# Patient Record
Sex: Female | Born: 1935 | Race: White | Hispanic: No | Smoking: Former smoker
Health system: Southern US, Community
[De-identification: ages and names within clinical notes are randomized; demographics above are authoritative.]

## PROBLEM LIST (undated history)

## (undated) DIAGNOSIS — IMO0002 Reserved for concepts with insufficient information to code with codable children: Secondary | ICD-10-CM

## (undated) DIAGNOSIS — I771 Stricture of artery: Secondary | ICD-10-CM

## (undated) DIAGNOSIS — J449 Chronic obstructive pulmonary disease, unspecified: Secondary | ICD-10-CM

## (undated) DIAGNOSIS — F419 Anxiety disorder, unspecified: Secondary | ICD-10-CM

## (undated) DIAGNOSIS — K2970 Gastritis, unspecified, without bleeding: Secondary | ICD-10-CM

## (undated) DIAGNOSIS — F329 Major depressive disorder, single episode, unspecified: Secondary | ICD-10-CM

## (undated) DIAGNOSIS — I739 Peripheral vascular disease, unspecified: Secondary | ICD-10-CM

## (undated) DIAGNOSIS — Z720 Tobacco use: Secondary | ICD-10-CM

## (undated) DIAGNOSIS — I779 Disorder of arteries and arterioles, unspecified: Secondary | ICD-10-CM

## (undated) DIAGNOSIS — G4733 Obstructive sleep apnea (adult) (pediatric): Secondary | ICD-10-CM

## (undated) DIAGNOSIS — I10 Essential (primary) hypertension: Secondary | ICD-10-CM

## (undated) DIAGNOSIS — I2510 Atherosclerotic heart disease of native coronary artery without angina pectoris: Secondary | ICD-10-CM

## (undated) DIAGNOSIS — R0902 Hypoxemia: Secondary | ICD-10-CM

## (undated) DIAGNOSIS — E119 Type 2 diabetes mellitus without complications: Secondary | ICD-10-CM

## (undated) DIAGNOSIS — E785 Hyperlipidemia, unspecified: Secondary | ICD-10-CM

## (undated) DIAGNOSIS — I214 Non-ST elevation (NSTEMI) myocardial infarction: Secondary | ICD-10-CM

## (undated) DIAGNOSIS — F32A Depression, unspecified: Secondary | ICD-10-CM

## (undated) HISTORY — DX: Peripheral vascular disease, unspecified: I73.9

## (undated) HISTORY — DX: Atherosclerotic heart disease of native coronary artery without angina pectoris: I25.10

## (undated) HISTORY — DX: Hypoxemia: R09.02

## (undated) HISTORY — DX: Chronic obstructive pulmonary disease, unspecified: J44.9

## (undated) HISTORY — DX: Depression, unspecified: F32.A

## (undated) HISTORY — DX: Reserved for concepts with insufficient information to code with codable children: IMO0002

## (undated) HISTORY — DX: Disorder of arteries and arterioles, unspecified: I77.9

## (undated) HISTORY — PX: THORACOTOMY: SHX5074

## (undated) HISTORY — DX: Hyperlipidemia, unspecified: E78.5

## (undated) HISTORY — DX: Obstructive sleep apnea (adult) (pediatric): G47.33

## (undated) HISTORY — DX: Essential (primary) hypertension: I10

## (undated) HISTORY — DX: Major depressive disorder, single episode, unspecified: F32.9

## (undated) HISTORY — PX: CAROTID ENDARTERECTOMY: SUR193

## (undated) HISTORY — DX: Type 2 diabetes mellitus without complications: E11.9

## (undated) HISTORY — PX: OTHER SURGICAL HISTORY: SHX169

## (undated) HISTORY — PX: TOTAL ABDOMINAL HYSTERECTOMY: SHX209

## (undated) HISTORY — DX: Anxiety disorder, unspecified: F41.9

## (undated) HISTORY — DX: Non-ST elevation (NSTEMI) myocardial infarction: I21.4

## (undated) HISTORY — DX: Tobacco use: Z72.0

---

## 1993-10-07 HISTORY — PX: CORONARY ANGIOPLASTY: SHX604

## 1995-04-07 HISTORY — PX: CORONARY ANGIOPLASTY WITH STENT PLACEMENT: SHX49

## 1995-06-07 HISTORY — PX: OTHER SURGICAL HISTORY: SHX169

## 1997-01-04 HISTORY — PX: CORONARY ANGIOPLASTY WITH STENT PLACEMENT: SHX49

## 1998-07-07 HISTORY — PX: CORONARY ANGIOPLASTY WITH STENT PLACEMENT: SHX49

## 1998-07-17 ENCOUNTER — Encounter: Payer: Self-pay | Admitting: Emergency Medicine

## 1998-07-17 ENCOUNTER — Emergency Department (HOSPITAL_COMMUNITY): Admission: EM | Admit: 1998-07-17 | Discharge: 1998-07-17 | Payer: Self-pay | Admitting: Emergency Medicine

## 1998-07-21 ENCOUNTER — Encounter: Payer: Self-pay | Admitting: Cardiovascular Disease

## 1998-07-21 ENCOUNTER — Observation Stay (HOSPITAL_COMMUNITY): Admission: AD | Admit: 1998-07-21 | Discharge: 1998-07-22 | Payer: Self-pay | Admitting: Cardiovascular Disease

## 1998-11-04 ENCOUNTER — Observation Stay (HOSPITAL_COMMUNITY): Admission: RE | Admit: 1998-11-04 | Discharge: 1998-11-05 | Payer: Self-pay | Admitting: Cardiovascular Disease

## 1998-12-04 ENCOUNTER — Inpatient Hospital Stay (HOSPITAL_COMMUNITY): Admission: RE | Admit: 1998-12-04 | Discharge: 1998-12-05 | Payer: Self-pay | Admitting: Vascular Surgery

## 1998-12-04 ENCOUNTER — Encounter: Payer: Self-pay | Admitting: Vascular Surgery

## 2000-12-25 ENCOUNTER — Emergency Department (HOSPITAL_COMMUNITY): Admission: EM | Admit: 2000-12-25 | Discharge: 2000-12-25 | Payer: Self-pay | Admitting: *Deleted

## 2000-12-27 ENCOUNTER — Emergency Department (HOSPITAL_COMMUNITY): Admission: EM | Admit: 2000-12-27 | Discharge: 2000-12-27 | Payer: Self-pay | Admitting: Emergency Medicine

## 2002-07-23 ENCOUNTER — Ambulatory Visit (HOSPITAL_COMMUNITY): Admission: RE | Admit: 2002-07-23 | Discharge: 2002-07-23 | Payer: Self-pay | Admitting: Cardiovascular Disease

## 2002-07-23 ENCOUNTER — Encounter: Payer: Self-pay | Admitting: Cardiovascular Disease

## 2002-10-04 ENCOUNTER — Other Ambulatory Visit: Admission: RE | Admit: 2002-10-04 | Discharge: 2002-10-04 | Payer: Self-pay | Admitting: *Deleted

## 2002-11-25 ENCOUNTER — Encounter: Payer: Self-pay | Admitting: *Deleted

## 2002-11-25 ENCOUNTER — Emergency Department (HOSPITAL_COMMUNITY): Admission: EM | Admit: 2002-11-25 | Discharge: 2002-11-25 | Payer: Self-pay | Admitting: *Deleted

## 2002-12-03 ENCOUNTER — Encounter (INDEPENDENT_AMBULATORY_CARE_PROVIDER_SITE_OTHER): Payer: Self-pay | Admitting: *Deleted

## 2002-12-03 ENCOUNTER — Inpatient Hospital Stay (HOSPITAL_COMMUNITY): Admission: RE | Admit: 2002-12-03 | Discharge: 2002-12-08 | Payer: Self-pay | Admitting: *Deleted

## 2003-01-15 ENCOUNTER — Encounter: Admission: RE | Admit: 2003-01-15 | Discharge: 2003-04-15 | Payer: Self-pay | Admitting: *Deleted

## 2003-04-30 ENCOUNTER — Encounter: Admission: RE | Admit: 2003-04-30 | Discharge: 2003-07-29 | Payer: Self-pay | Admitting: *Deleted

## 2004-03-15 ENCOUNTER — Emergency Department (HOSPITAL_COMMUNITY): Admission: EM | Admit: 2004-03-15 | Discharge: 2004-03-16 | Payer: Self-pay | Admitting: Emergency Medicine

## 2004-09-09 ENCOUNTER — Encounter: Admission: RE | Admit: 2004-09-09 | Discharge: 2004-09-09 | Payer: Self-pay | Admitting: Cardiovascular Disease

## 2004-09-14 ENCOUNTER — Ambulatory Visit (HOSPITAL_COMMUNITY): Admission: RE | Admit: 2004-09-14 | Discharge: 2004-09-15 | Payer: Self-pay | Admitting: Cardiovascular Disease

## 2004-12-23 ENCOUNTER — Ambulatory Visit: Payer: Self-pay | Admitting: Internal Medicine

## 2004-12-23 ENCOUNTER — Ambulatory Visit (HOSPITAL_COMMUNITY): Admission: RE | Admit: 2004-12-23 | Discharge: 2004-12-23 | Payer: Self-pay | Admitting: Internal Medicine

## 2005-01-04 ENCOUNTER — Ambulatory Visit: Payer: Self-pay | Admitting: Internal Medicine

## 2005-03-30 ENCOUNTER — Ambulatory Visit: Payer: Self-pay | Admitting: Internal Medicine

## 2005-10-13 ENCOUNTER — Ambulatory Visit: Payer: Self-pay | Admitting: Internal Medicine

## 2006-06-20 ENCOUNTER — Ambulatory Visit: Payer: Self-pay | Admitting: Internal Medicine

## 2006-07-19 ENCOUNTER — Ambulatory Visit: Payer: Self-pay | Admitting: Internal Medicine

## 2006-08-24 ENCOUNTER — Ambulatory Visit: Payer: Self-pay | Admitting: Internal Medicine

## 2006-09-23 ENCOUNTER — Ambulatory Visit: Payer: Self-pay | Admitting: Internal Medicine

## 2006-10-11 ENCOUNTER — Ambulatory Visit: Payer: Self-pay | Admitting: Internal Medicine

## 2007-02-28 ENCOUNTER — Ambulatory Visit: Payer: Self-pay | Admitting: Internal Medicine

## 2007-02-28 LAB — CONVERTED CEMR LAB
ALT: 13 units/L (ref 0–40)
BUN: 18 mg/dL (ref 6–23)
Calcium: 9.6 mg/dL (ref 8.4–10.5)
Creatinine, Ser: 0.8 mg/dL (ref 0.4–1.2)
GFR calc Af Amer: 90 mL/min
GFR calc non Af Amer: 75 mL/min
HDL: 48.1 mg/dL (ref >39.0)
LDL Cholesterol: 91 mg/dL (ref 0–99)
VLDL: 24 mg/dL (ref 0–40)

## 2007-05-18 ENCOUNTER — Ambulatory Visit: Payer: Self-pay | Admitting: Internal Medicine

## 2007-05-18 LAB — CONVERTED CEMR LAB
Crystals: NEGATIVE
RBC / HPF: NONE SEEN
Total Protein, Urine: NEGATIVE mg/dL
Urine Glucose: NEGATIVE mg/dL
pH: 5.5 (ref 5.0–8.0)

## 2007-05-19 ENCOUNTER — Encounter: Payer: Self-pay | Admitting: Internal Medicine

## 2007-07-04 ENCOUNTER — Encounter: Payer: Self-pay | Admitting: Internal Medicine

## 2007-07-04 ENCOUNTER — Ambulatory Visit: Payer: Self-pay | Admitting: Internal Medicine

## 2007-07-04 DIAGNOSIS — I739 Peripheral vascular disease, unspecified: Secondary | ICD-10-CM | POA: Insufficient documentation

## 2007-07-04 DIAGNOSIS — E1149 Type 2 diabetes mellitus with other diabetic neurological complication: Secondary | ICD-10-CM | POA: Insufficient documentation

## 2007-07-04 DIAGNOSIS — E785 Hyperlipidemia, unspecified: Secondary | ICD-10-CM

## 2007-07-04 DIAGNOSIS — F17200 Nicotine dependence, unspecified, uncomplicated: Secondary | ICD-10-CM | POA: Insufficient documentation

## 2007-07-04 DIAGNOSIS — E119 Type 2 diabetes mellitus without complications: Secondary | ICD-10-CM | POA: Insufficient documentation

## 2007-07-04 DIAGNOSIS — K5900 Constipation, unspecified: Secondary | ICD-10-CM | POA: Insufficient documentation

## 2007-07-04 DIAGNOSIS — M259 Joint disorder, unspecified: Secondary | ICD-10-CM | POA: Insufficient documentation

## 2007-07-04 DIAGNOSIS — I2510 Atherosclerotic heart disease of native coronary artery without angina pectoris: Secondary | ICD-10-CM | POA: Insufficient documentation

## 2007-07-04 DIAGNOSIS — I7389 Other specified peripheral vascular diseases: Secondary | ICD-10-CM

## 2007-07-04 DIAGNOSIS — M25519 Pain in unspecified shoulder: Secondary | ICD-10-CM | POA: Insufficient documentation

## 2007-07-26 ENCOUNTER — Inpatient Hospital Stay (HOSPITAL_COMMUNITY): Admission: EM | Admit: 2007-07-26 | Discharge: 2007-07-29 | Payer: Self-pay | Admitting: Emergency Medicine

## 2007-07-31 ENCOUNTER — Telehealth: Payer: Self-pay | Admitting: Internal Medicine

## 2007-08-01 ENCOUNTER — Ambulatory Visit: Payer: Self-pay | Admitting: Internal Medicine

## 2007-08-02 ENCOUNTER — Encounter: Payer: Self-pay | Admitting: Internal Medicine

## 2007-08-07 HISTORY — PX: CORONARY ANGIOGRAM: SHX5786

## 2007-08-17 ENCOUNTER — Observation Stay (HOSPITAL_COMMUNITY): Admission: RE | Admit: 2007-08-17 | Discharge: 2007-08-18 | Payer: Self-pay | Admitting: Cardiovascular Disease

## 2007-09-13 ENCOUNTER — Encounter: Payer: Self-pay | Admitting: Internal Medicine

## 2007-09-19 ENCOUNTER — Ambulatory Visit: Payer: Self-pay | Admitting: Internal Medicine

## 2007-09-19 LAB — CONVERTED CEMR LAB
ALT: 12 units/L (ref 0–35)
BUN: 12 mg/dL (ref 6–23)
CO2: 32 meq/L (ref 19–32)
Creatinine, Ser: 0.8 mg/dL (ref 0.4–1.2)
GFR calc non Af Amer: 75 mL/min
Glucose, Bld: 112 mg/dL — ABNORMAL HIGH (ref 70–99)
HDL: 45.8 mg/dL (ref >39.0)
LDL Cholesterol: 70 mg/dL (ref 0–99)
Potassium: 4.7 meq/L (ref 3.5–5.1)
Total CHOL/HDL Ratio: 3.1
Triglycerides: 126 mg/dL (ref 0–149)

## 2007-09-27 ENCOUNTER — Encounter: Payer: Self-pay | Admitting: Pulmonary Disease

## 2007-10-04 ENCOUNTER — Ambulatory Visit: Payer: Self-pay | Admitting: Internal Medicine

## 2007-10-23 ENCOUNTER — Telehealth: Payer: Self-pay | Admitting: Internal Medicine

## 2007-10-26 ENCOUNTER — Telehealth: Payer: Self-pay | Admitting: Internal Medicine

## 2007-10-26 ENCOUNTER — Ambulatory Visit: Payer: Self-pay | Admitting: Internal Medicine

## 2008-04-06 DIAGNOSIS — IMO0002 Reserved for concepts with insufficient information to code with codable children: Secondary | ICD-10-CM

## 2008-04-06 HISTORY — DX: Reserved for concepts with insufficient information to code with codable children: IMO0002

## 2008-04-22 ENCOUNTER — Encounter: Payer: Self-pay | Admitting: Pulmonary Disease

## 2008-04-24 ENCOUNTER — Encounter: Payer: Self-pay | Admitting: Internal Medicine

## 2008-05-15 ENCOUNTER — Telehealth: Payer: Self-pay | Admitting: Internal Medicine

## 2008-06-04 ENCOUNTER — Ambulatory Visit: Payer: Self-pay | Admitting: Internal Medicine

## 2008-06-10 ENCOUNTER — Encounter: Payer: Self-pay | Admitting: Internal Medicine

## 2008-06-12 ENCOUNTER — Ambulatory Visit: Payer: Self-pay | Admitting: Pulmonary Disease

## 2008-06-12 DIAGNOSIS — I2789 Other specified pulmonary heart diseases: Secondary | ICD-10-CM

## 2008-06-13 ENCOUNTER — Telehealth: Payer: Self-pay | Admitting: Pulmonary Disease

## 2008-06-14 DIAGNOSIS — G4733 Obstructive sleep apnea (adult) (pediatric): Secondary | ICD-10-CM

## 2008-06-18 ENCOUNTER — Telehealth: Payer: Self-pay | Admitting: Pulmonary Disease

## 2008-06-19 ENCOUNTER — Ambulatory Visit: Payer: Self-pay | Admitting: Pulmonary Disease

## 2008-06-19 ENCOUNTER — Encounter: Payer: Self-pay | Admitting: Internal Medicine

## 2008-06-20 ENCOUNTER — Telehealth: Payer: Self-pay | Admitting: Internal Medicine

## 2008-06-28 ENCOUNTER — Telehealth (INDEPENDENT_AMBULATORY_CARE_PROVIDER_SITE_OTHER): Payer: Self-pay | Admitting: *Deleted

## 2008-07-01 ENCOUNTER — Ambulatory Visit: Payer: Self-pay | Admitting: Internal Medicine

## 2008-07-01 ENCOUNTER — Encounter: Payer: Self-pay | Admitting: Pulmonary Disease

## 2008-07-08 ENCOUNTER — Telehealth: Payer: Self-pay | Admitting: Internal Medicine

## 2008-07-15 ENCOUNTER — Telehealth: Payer: Self-pay | Admitting: Internal Medicine

## 2008-07-16 ENCOUNTER — Ambulatory Visit: Payer: Self-pay | Admitting: Pulmonary Disease

## 2008-07-16 DIAGNOSIS — J449 Chronic obstructive pulmonary disease, unspecified: Secondary | ICD-10-CM | POA: Insufficient documentation

## 2008-07-18 ENCOUNTER — Encounter: Payer: Self-pay | Admitting: Pulmonary Disease

## 2008-08-07 ENCOUNTER — Encounter: Payer: Self-pay | Admitting: Pulmonary Disease

## 2008-08-07 ENCOUNTER — Encounter (HOSPITAL_COMMUNITY): Admission: RE | Admit: 2008-08-07 | Discharge: 2008-09-05 | Payer: Self-pay | Admitting: Pulmonary Disease

## 2008-08-13 ENCOUNTER — Telehealth: Payer: Self-pay | Admitting: Internal Medicine

## 2008-09-06 ENCOUNTER — Encounter (HOSPITAL_COMMUNITY): Admission: RE | Admit: 2008-09-06 | Discharge: 2008-12-05 | Payer: Self-pay | Admitting: Pulmonary Disease

## 2008-09-12 ENCOUNTER — Encounter: Payer: Self-pay | Admitting: Internal Medicine

## 2008-09-17 ENCOUNTER — Encounter: Payer: Self-pay | Admitting: Internal Medicine

## 2008-09-18 ENCOUNTER — Encounter: Payer: Self-pay | Admitting: Pulmonary Disease

## 2008-09-20 ENCOUNTER — Telehealth: Payer: Self-pay | Admitting: Internal Medicine

## 2008-10-07 ENCOUNTER — Telehealth: Payer: Self-pay | Admitting: Internal Medicine

## 2008-10-09 ENCOUNTER — Encounter: Payer: Self-pay | Admitting: Pulmonary Disease

## 2008-10-09 ENCOUNTER — Telehealth: Payer: Self-pay | Admitting: Internal Medicine

## 2008-10-22 ENCOUNTER — Ambulatory Visit: Payer: Self-pay | Admitting: Internal Medicine

## 2008-10-22 LAB — CONVERTED CEMR LAB
BUN: 19 mg/dL (ref 6–23)
CO2: 31 meq/L (ref 19–32)
Chloride: 103 meq/L (ref 96–112)
Creatinine,U: 101.9 mg/dL
GFR calc Af Amer: 105 mL/min
GFR calc non Af Amer: 87 mL/min
Glucose, Bld: 143 mg/dL — ABNORMAL HIGH (ref 70–99)
Hgb A1c MFr Bld: 7.1 % — ABNORMAL HIGH (ref 4.6–6.0)
Microalb, Ur: 5.6 mg/dL — ABNORMAL HIGH (ref 0.0–1.9)

## 2008-10-23 ENCOUNTER — Encounter: Payer: Self-pay | Admitting: Internal Medicine

## 2008-11-06 ENCOUNTER — Ambulatory Visit: Payer: Self-pay | Admitting: Pulmonary Disease

## 2008-11-20 ENCOUNTER — Encounter: Payer: Self-pay | Admitting: Internal Medicine

## 2008-12-06 ENCOUNTER — Encounter (HOSPITAL_COMMUNITY): Admission: RE | Admit: 2008-12-06 | Discharge: 2009-03-06 | Payer: Self-pay | Admitting: Pulmonary Disease

## 2008-12-09 ENCOUNTER — Telehealth: Payer: Self-pay | Admitting: Internal Medicine

## 2008-12-18 ENCOUNTER — Telehealth: Payer: Self-pay | Admitting: Internal Medicine

## 2008-12-19 ENCOUNTER — Ambulatory Visit: Payer: Self-pay | Admitting: Internal Medicine

## 2008-12-19 LAB — CONVERTED CEMR LAB
Bilirubin Urine: NEGATIVE
Hemoglobin, Urine: NEGATIVE
Ketones, ur: NEGATIVE mg/dL
Leukocytes, UA: NEGATIVE
Nitrite: NEGATIVE
Urine Glucose: NEGATIVE mg/dL
Urobilinogen, UA: 0.2 (ref 0.0–1.0)

## 2008-12-24 ENCOUNTER — Telehealth: Payer: Self-pay | Admitting: Internal Medicine

## 2008-12-31 ENCOUNTER — Telehealth: Payer: Self-pay | Admitting: Internal Medicine

## 2009-01-13 ENCOUNTER — Telehealth: Payer: Self-pay | Admitting: Internal Medicine

## 2009-01-13 ENCOUNTER — Ambulatory Visit: Payer: Self-pay | Admitting: Internal Medicine

## 2009-01-15 ENCOUNTER — Encounter: Payer: Self-pay | Admitting: Internal Medicine

## 2009-03-19 ENCOUNTER — Encounter: Payer: Self-pay | Admitting: Internal Medicine

## 2009-03-19 ENCOUNTER — Ambulatory Visit: Payer: Self-pay | Admitting: Pulmonary Disease

## 2009-03-19 DIAGNOSIS — J9611 Chronic respiratory failure with hypoxia: Secondary | ICD-10-CM | POA: Insufficient documentation

## 2009-03-21 ENCOUNTER — Encounter: Payer: Self-pay | Admitting: Pulmonary Disease

## 2009-03-24 ENCOUNTER — Encounter: Payer: Self-pay | Admitting: Pulmonary Disease

## 2009-03-25 ENCOUNTER — Encounter: Admission: RE | Admit: 2009-03-25 | Discharge: 2009-03-25 | Payer: Self-pay | Admitting: Cardiovascular Disease

## 2009-03-25 ENCOUNTER — Encounter: Payer: Self-pay | Admitting: Internal Medicine

## 2009-03-26 ENCOUNTER — Encounter: Payer: Self-pay | Admitting: Pulmonary Disease

## 2009-03-31 ENCOUNTER — Inpatient Hospital Stay (HOSPITAL_COMMUNITY): Admission: RE | Admit: 2009-03-31 | Discharge: 2009-04-09 | Payer: Self-pay | Admitting: Cardiovascular Disease

## 2009-04-01 ENCOUNTER — Encounter (INDEPENDENT_AMBULATORY_CARE_PROVIDER_SITE_OTHER): Payer: Self-pay | Admitting: Cardiovascular Disease

## 2009-04-07 ENCOUNTER — Encounter (INDEPENDENT_AMBULATORY_CARE_PROVIDER_SITE_OTHER): Payer: Self-pay | Admitting: Cardiology

## 2009-04-07 ENCOUNTER — Ambulatory Visit: Payer: Self-pay | Admitting: Vascular Surgery

## 2009-04-11 ENCOUNTER — Telehealth: Payer: Self-pay | Admitting: Internal Medicine

## 2009-04-20 ENCOUNTER — Encounter: Payer: Self-pay | Admitting: Pulmonary Disease

## 2009-04-22 ENCOUNTER — Encounter: Payer: Self-pay | Admitting: Internal Medicine

## 2009-05-01 ENCOUNTER — Encounter: Payer: Self-pay | Admitting: Pulmonary Disease

## 2009-05-20 ENCOUNTER — Ambulatory Visit: Payer: Self-pay | Admitting: Pulmonary Disease

## 2009-06-17 ENCOUNTER — Telehealth: Payer: Self-pay | Admitting: Internal Medicine

## 2009-07-03 ENCOUNTER — Ambulatory Visit: Payer: Self-pay | Admitting: Pulmonary Disease

## 2009-07-03 ENCOUNTER — Inpatient Hospital Stay (HOSPITAL_COMMUNITY): Admission: AD | Admit: 2009-07-03 | Discharge: 2009-07-07 | Payer: Self-pay | Admitting: Pulmonary Disease

## 2009-07-09 ENCOUNTER — Encounter: Payer: Self-pay | Admitting: Pulmonary Disease

## 2009-07-14 ENCOUNTER — Ambulatory Visit: Payer: Self-pay | Admitting: Pulmonary Disease

## 2009-07-16 ENCOUNTER — Encounter: Payer: Self-pay | Admitting: Internal Medicine

## 2009-07-21 ENCOUNTER — Ambulatory Visit: Payer: Self-pay | Admitting: Internal Medicine

## 2009-07-22 ENCOUNTER — Encounter: Payer: Self-pay | Admitting: Internal Medicine

## 2009-07-25 ENCOUNTER — Telehealth: Payer: Self-pay | Admitting: Internal Medicine

## 2009-07-28 ENCOUNTER — Telehealth (INDEPENDENT_AMBULATORY_CARE_PROVIDER_SITE_OTHER): Payer: Self-pay | Admitting: *Deleted

## 2009-07-30 ENCOUNTER — Ambulatory Visit: Payer: Self-pay | Admitting: Internal Medicine

## 2009-08-08 ENCOUNTER — Telehealth (INDEPENDENT_AMBULATORY_CARE_PROVIDER_SITE_OTHER): Payer: Self-pay | Admitting: *Deleted

## 2009-08-12 ENCOUNTER — Telehealth (INDEPENDENT_AMBULATORY_CARE_PROVIDER_SITE_OTHER): Payer: Self-pay | Admitting: *Deleted

## 2009-08-13 ENCOUNTER — Ambulatory Visit: Payer: Self-pay | Admitting: Pulmonary Disease

## 2009-09-15 ENCOUNTER — Telehealth: Payer: Self-pay | Admitting: Pulmonary Disease

## 2009-09-30 ENCOUNTER — Encounter: Payer: Self-pay | Admitting: Pulmonary Disease

## 2009-10-15 ENCOUNTER — Ambulatory Visit: Payer: Self-pay | Admitting: Pulmonary Disease

## 2009-10-15 ENCOUNTER — Encounter: Payer: Self-pay | Admitting: Pulmonary Disease

## 2009-10-21 ENCOUNTER — Encounter: Payer: Self-pay | Admitting: Pulmonary Disease

## 2009-12-17 ENCOUNTER — Encounter: Payer: Self-pay | Admitting: Pulmonary Disease

## 2009-12-18 ENCOUNTER — Telehealth: Payer: Self-pay | Admitting: Internal Medicine

## 2009-12-21 ENCOUNTER — Encounter: Payer: Self-pay | Admitting: Pulmonary Disease

## 2009-12-24 ENCOUNTER — Ambulatory Visit: Payer: Self-pay | Admitting: Internal Medicine

## 2009-12-26 DIAGNOSIS — R1013 Epigastric pain: Secondary | ICD-10-CM

## 2009-12-26 DIAGNOSIS — K3189 Other diseases of stomach and duodenum: Secondary | ICD-10-CM

## 2009-12-30 ENCOUNTER — Encounter: Payer: Self-pay | Admitting: Pulmonary Disease

## 2010-01-05 ENCOUNTER — Encounter: Payer: Self-pay | Admitting: Pulmonary Disease

## 2010-01-15 IMAGING — CR DG CHEST 2V
2 series · 2 of 2 positions shown · non-contrast
Comparison: 10/26/2007

CLINICAL DATA: Pre procedure, shortness of breath.

CHEST - 2 VIEW

[w chest pa]
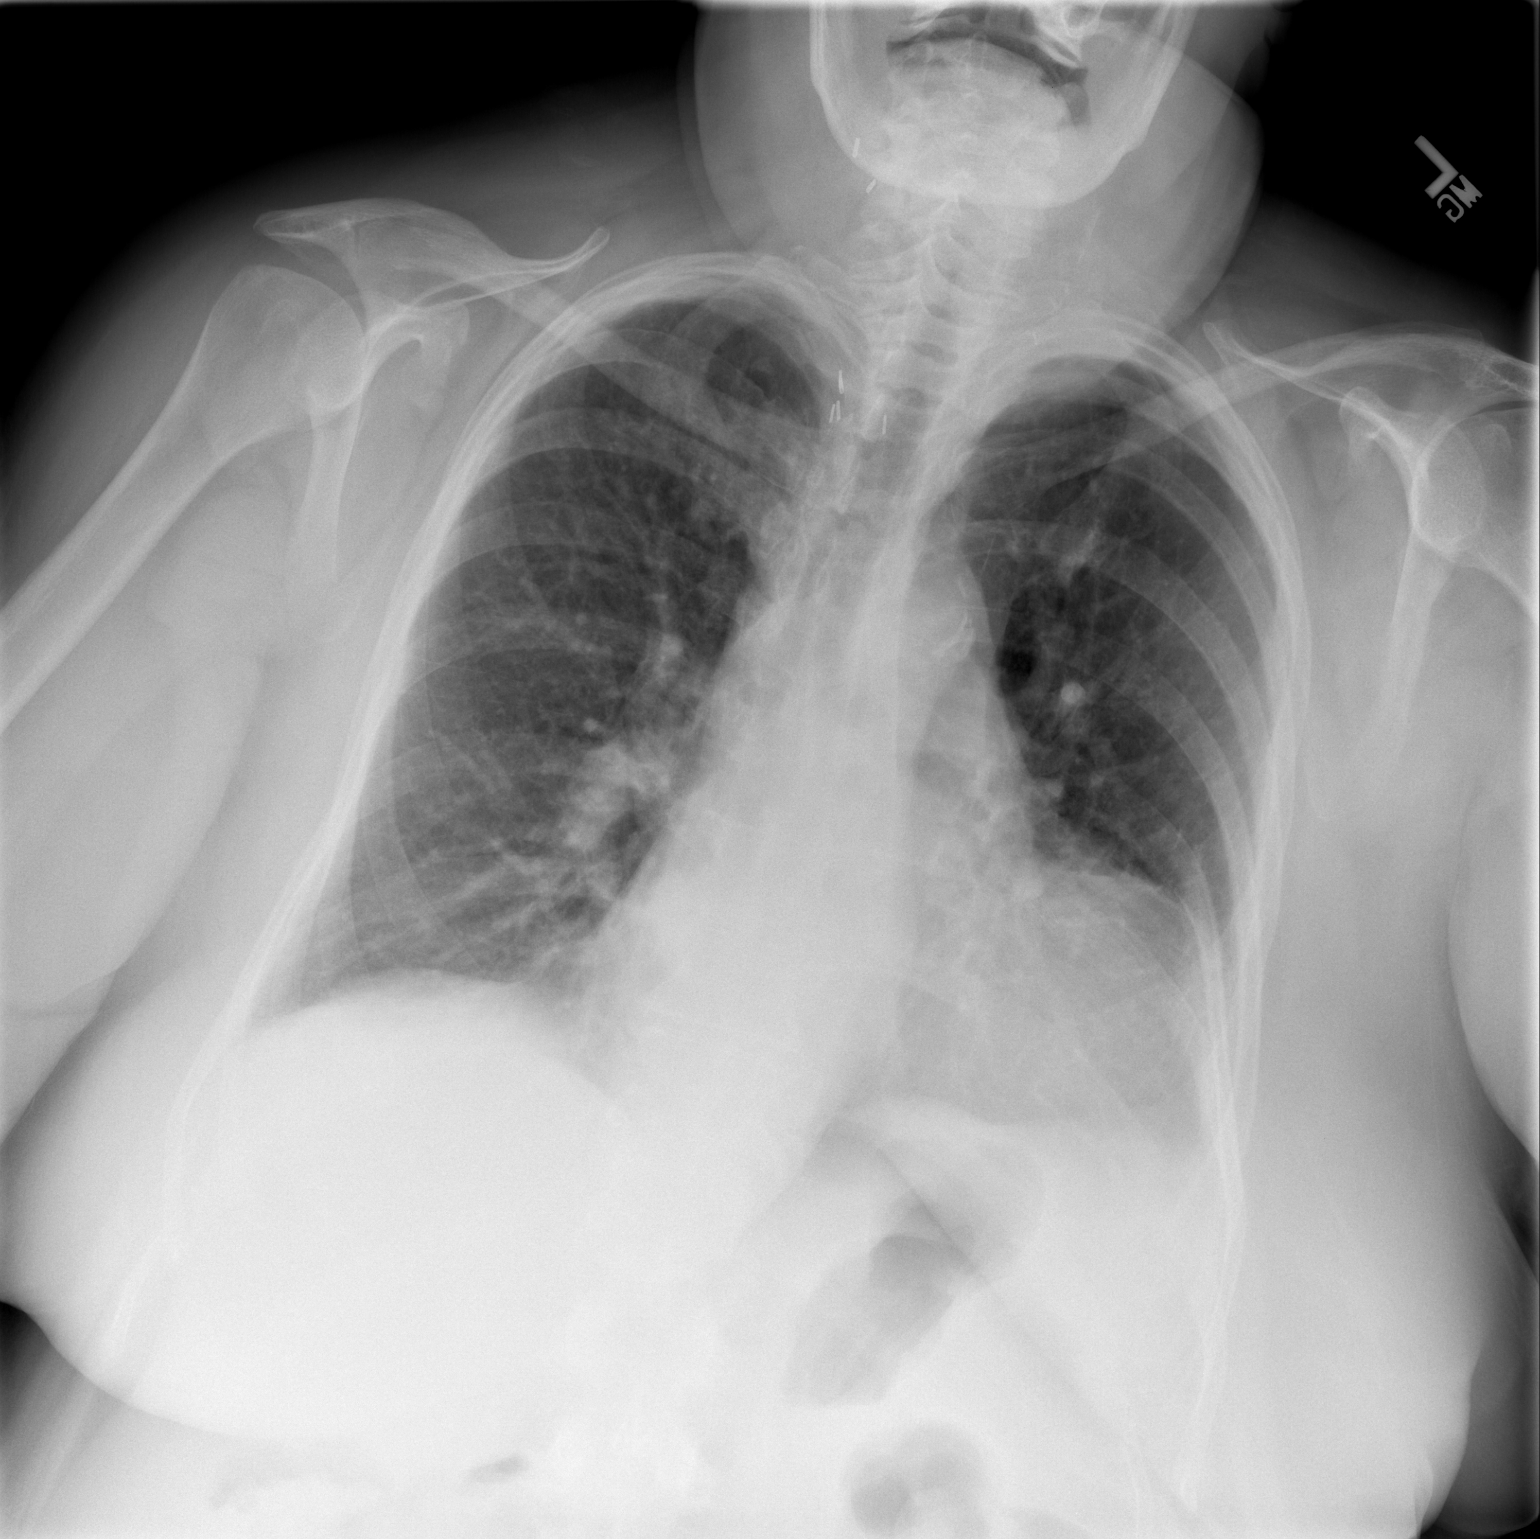

[w chest lat]
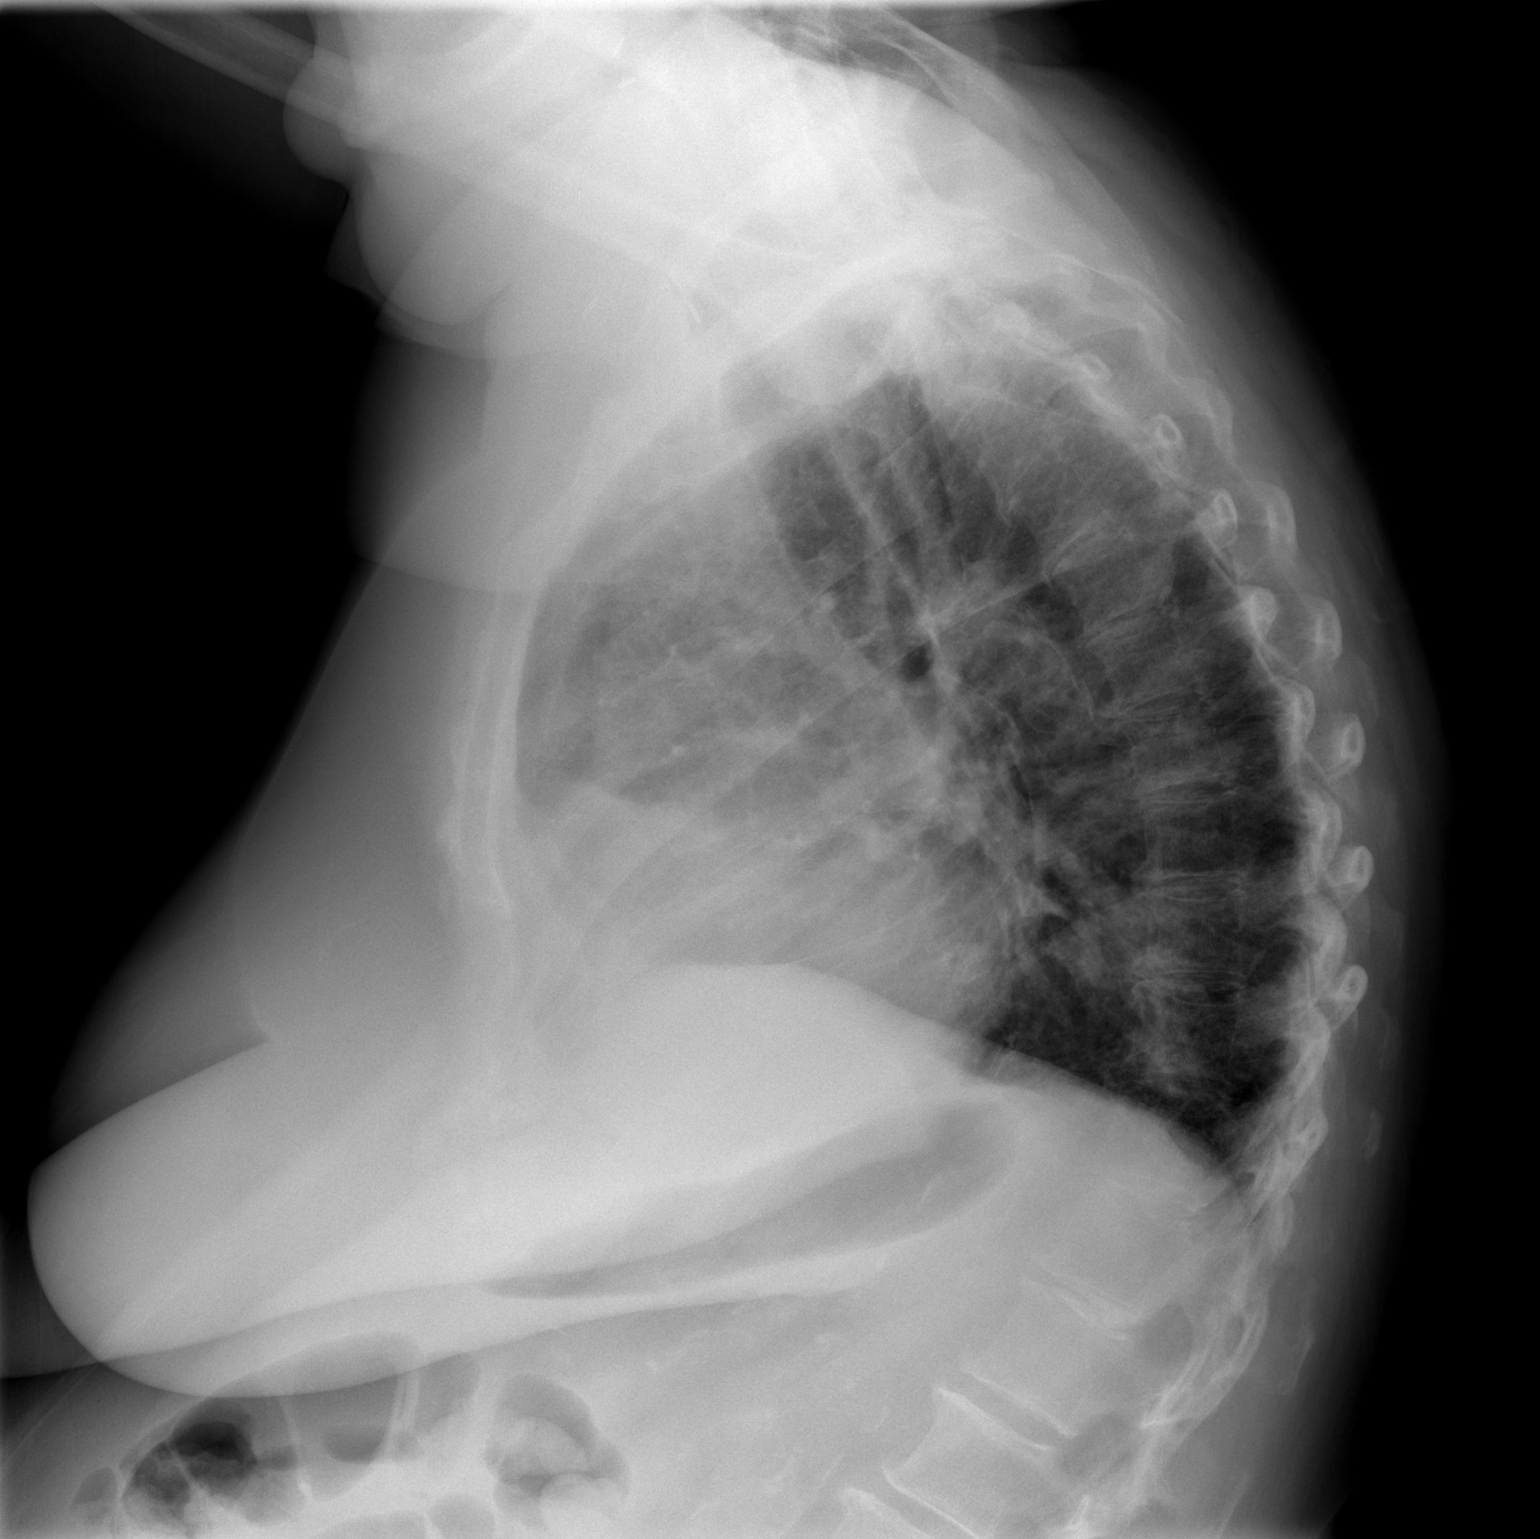

[2 of 2 positions shown; findings below may reference images not displayed]

FINDINGS: Trachea is midline.  Surgical clips are seen in the
paratracheal region.  Heart is enlarged, stable.  Thoracic aorta is
calcified.  There may be scarring at the left costophrenic angle.
Lungs otherwise clear.  No pleural fluid.
IMPRESSION: No acute findings.

## 2010-01-27 ENCOUNTER — Encounter: Payer: Self-pay | Admitting: Internal Medicine

## 2010-02-17 ENCOUNTER — Telehealth: Payer: Self-pay | Admitting: Internal Medicine

## 2010-03-24 ENCOUNTER — Ambulatory Visit: Payer: Self-pay | Admitting: Internal Medicine

## 2010-03-24 ENCOUNTER — Ambulatory Visit: Payer: Self-pay | Admitting: Pulmonary Disease

## 2010-03-24 LAB — CONVERTED CEMR LAB
ALT: 31 units/L (ref 0–35)
Albumin: 3.3 g/dL — ABNORMAL LOW (ref 3.5–5.2)
Alkaline Phosphatase: 75 units/L (ref 39–117)
Total Bilirubin: 0.3 mg/dL (ref 0.3–1.2)
Total CHOL/HDL Ratio: 3
Triglycerides: 190 mg/dL — ABNORMAL HIGH (ref 0.0–149.0)

## 2010-03-28 ENCOUNTER — Encounter: Payer: Self-pay | Admitting: Internal Medicine

## 2010-05-06 IMAGING — CR DG CHEST 2V
2 series · 2 of 2 positions shown · non-contrast
Comparison: 07/06/2009 and 07/03/2009.

CLINICAL DATA: Recent pneumonia.  COPD.

CHEST - 2 VIEW

[view not recorded (1 of 2)]
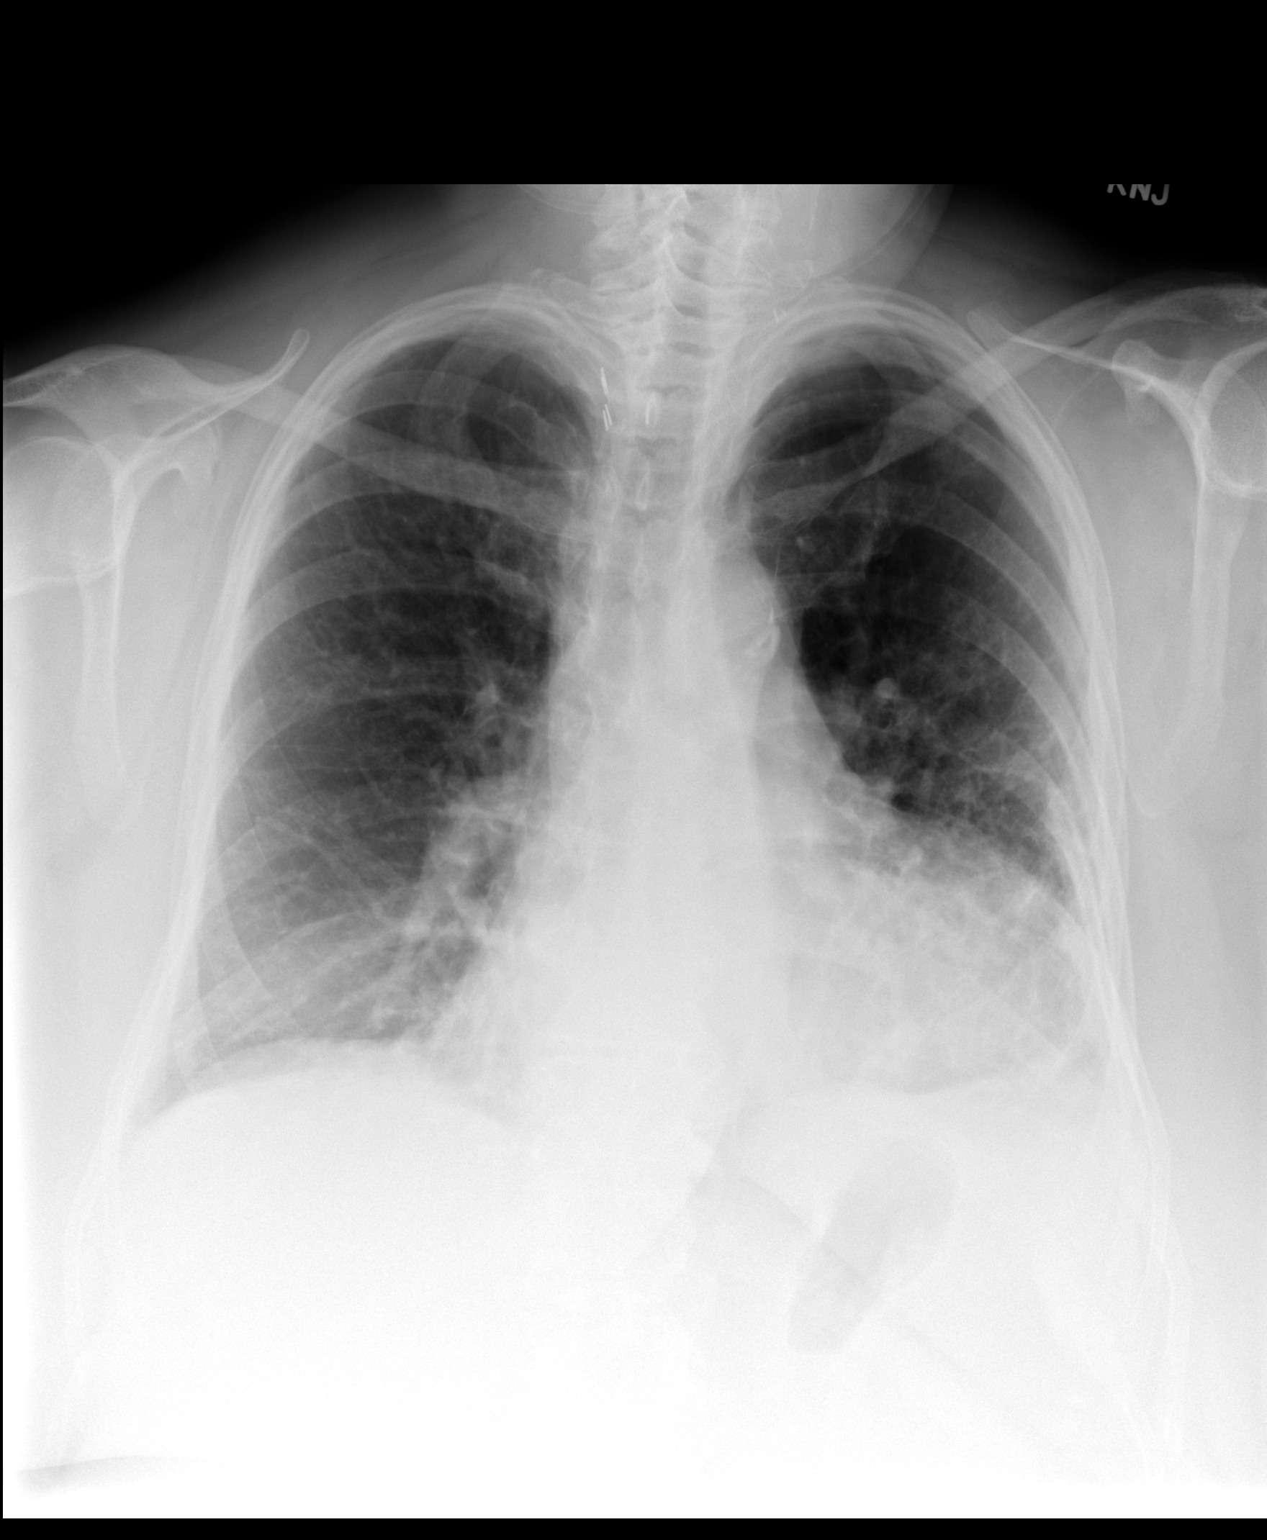

[view not recorded (2 of 2)]
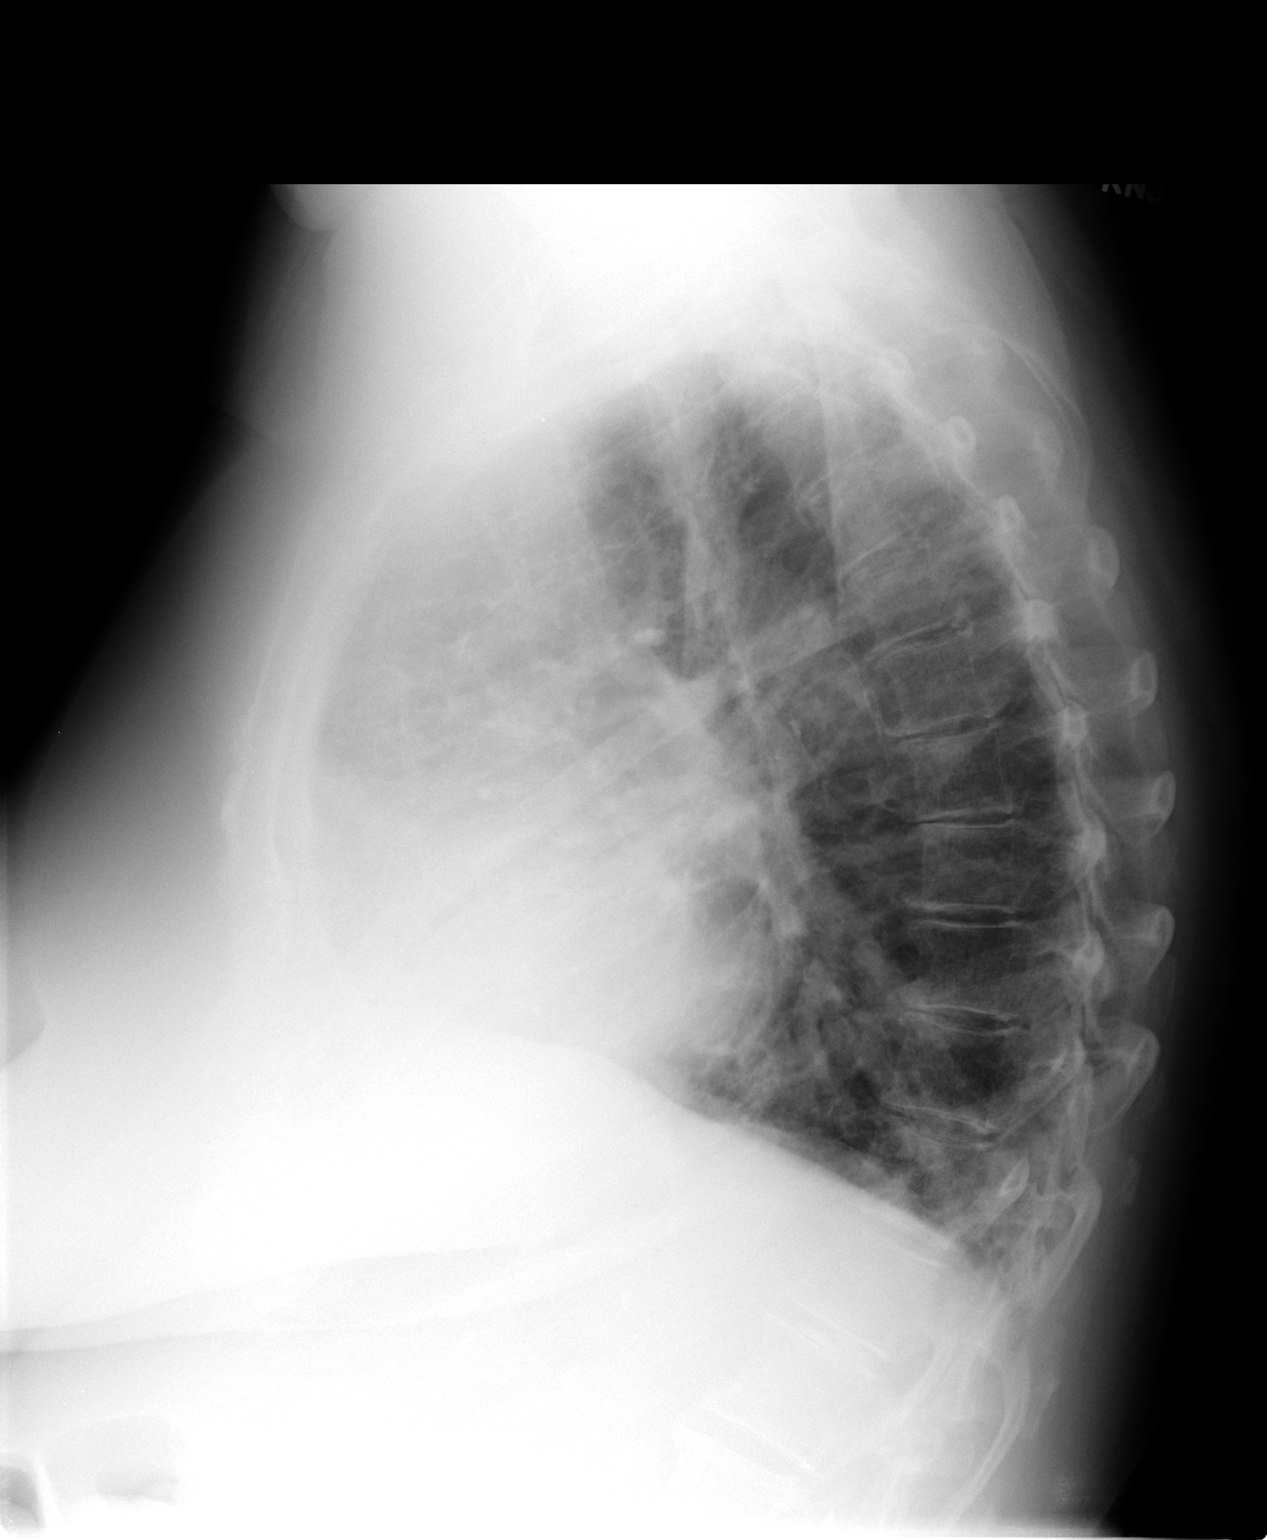

[2 of 2 positions shown; findings below may reference images not displayed]

FINDINGS: There is stable cardiac enlargement.  Left basilar air
space opacities have partially cleared.  Underlying left pleural
thickening is similar to older prior studies.  The right lung is
clear.  There are surgical clips in the lower neck.
IMPRESSION: Partial clearing of left lower lobe pneumonia.  Assuming the
patient is clinically improving, additional followup in 2-4 weeks
is suggested.

## 2010-06-02 ENCOUNTER — Telehealth: Payer: Self-pay | Admitting: Internal Medicine

## 2010-06-19 ENCOUNTER — Ambulatory Visit: Payer: Self-pay | Admitting: Internal Medicine

## 2010-07-03 ENCOUNTER — Encounter: Payer: Self-pay | Admitting: Pulmonary Disease

## 2010-07-21 ENCOUNTER — Encounter: Payer: Self-pay | Admitting: Internal Medicine

## 2010-07-22 ENCOUNTER — Ambulatory Visit: Payer: Self-pay | Admitting: Internal Medicine

## 2010-07-22 ENCOUNTER — Telehealth: Payer: Self-pay | Admitting: Internal Medicine

## 2010-07-22 LAB — CONVERTED CEMR LAB
Nitrite: NEGATIVE
Total Protein, Urine: NEGATIVE mg/dL
Urine Glucose: NEGATIVE mg/dL
Urobilinogen, UA: 0.2 (ref 0.0–1.0)

## 2010-07-27 ENCOUNTER — Ambulatory Visit: Payer: Self-pay | Admitting: Internal Medicine

## 2010-07-27 ENCOUNTER — Ambulatory Visit: Payer: Self-pay | Admitting: Cardiovascular Disease

## 2010-07-27 LAB — CONVERTED CEMR LAB
ALT: 23 units/L (ref 0–35)
Albumin: 3.4 g/dL — ABNORMAL LOW (ref 3.5–5.2)
Alkaline Phosphatase: 68 units/L (ref 39–117)
Amylase: 55 units/L (ref 27–131)
Basophils Absolute: 0 10*3/uL (ref 0.0–0.1)
Basophils Relative: 0.4 % (ref 0.0–3.0)
Bilirubin, Direct: 0.1 mg/dL (ref 0.0–0.3)
CO2: 34 meq/L — ABNORMAL HIGH (ref 19–32)
Chloride: 105 meq/L (ref 96–112)
Eosinophils Relative: 4.4 % (ref 0.0–5.0)
Hemoglobin: 10.5 g/dL — ABNORMAL LOW (ref 12.0–15.0)
Lipase: 51 units/L (ref 11.0–59.0)
Lymphocytes Relative: 13.6 % (ref 12.0–46.0)
MCV: 87.3 fL (ref 78.0–100.0)
Monocytes Absolute: 0.9 10*3/uL (ref 0.1–1.0)
Monocytes Relative: 8.1 % (ref 3.0–12.0)
Platelets: 324 10*3/uL (ref 150.0–400.0)
RBC: 3.57 M/uL — ABNORMAL LOW (ref 3.87–5.11)
Total Bilirubin: 0.4 mg/dL (ref 0.3–1.2)

## 2010-07-29 ENCOUNTER — Telehealth: Payer: Self-pay | Admitting: Internal Medicine

## 2010-08-03 ENCOUNTER — Telehealth: Payer: Self-pay | Admitting: Internal Medicine

## 2010-08-06 ENCOUNTER — Ambulatory Visit: Payer: Self-pay | Admitting: Internal Medicine

## 2010-08-18 ENCOUNTER — Telehealth: Payer: Self-pay | Admitting: Internal Medicine

## 2010-08-18 ENCOUNTER — Ambulatory Visit: Payer: Self-pay | Admitting: Pulmonary Disease

## 2010-08-27 ENCOUNTER — Telehealth: Payer: Self-pay | Admitting: Internal Medicine

## 2010-09-22 ENCOUNTER — Telehealth: Payer: Self-pay | Admitting: Internal Medicine

## 2010-10-06 NOTE — Letter (Signed)
[facility]   Imported By: Lester [place] 12/25/2009 12:52:00  _____________________________________________________________________  External Attachment:    Type:   Image     Comment:   External Document

## 2010-10-06 NOTE — Progress Notes (Signed)
Summary: REQ FOR RX  Phone Note Call from Patient   Caller: Daugther Lupita Leash [phone] Summary of Call: Per daughter, Cilosloslatol is ok per Dr Allyson Sabal to take. Pt is req rx and also to know what the med is for.  Initial call taken by: Lamar Sprinkles, CMA,  July 29, 2010 12:23 PM  Follow-up for Phone Call        cilostazol 100mg  two times a day to help circulation and relieve pain from poor circulation.   CT abddomen and pelvis was normal!! Still having belly pain? Maybe it is circulation related. Everything else has been negative.  Follow-up by: Jacques Navy MD,  July 29, 2010 1:40 PM  Additional Follow-up for Phone Call Additional follow up Details #1::        Daughter informed. Patient has had some relief from her pain since starting omeprazole two times a day.  Additional Follow-up by: Lamar Sprinkles, CMA,  July 29, 2010 5:59 PM    New/Updated Medications: CILOSTAZOL 100 MG TABS (CILOSTAZOL) 1 two times a day Prescriptions: CILOSTAZOL 100 MG TABS (CILOSTAZOL) 1 two times a day  #180 x 1   Entered by:   Lamar Sprinkles, CMA   Authorized by:   Jacques Navy MD   Signed by:   Lamar Sprinkles, CMA on 07/29/2010   Method used:   Electronically to        [facility] [address] [phone]* (retail)       [address]       [address]       [place], [place]  [address]       Ph: [phone]       Fax: [phone]   RxID:   [id number] CILOSTAZOL 100 MG TABS (CILOSTAZOL) 1 two times a day  #60 x 1   Entered by:   Lamar Sprinkles, CMA   Authorized by:   Jacques Navy MD   Signed by:   Lamar Sprinkles, CMA on 07/29/2010   Method used:   Electronically to        [facility] [address] [phone]* (retail)       [address]       [address]       [place], [place]  [address]       Ph: [phone]       Fax: [phone]   RxID:   [id number]

## 2010-10-06 NOTE — Assessment & Plan Note (Signed)
Summary: 4-5 MONTHS/APC   Copy to:  Andree Coss Primary Provider/Referring Provider:  Illene Regulus  CC:  Follow up, breathing is about the same, pt states she has been getting short winded often, pt states she had a cough for a week but then it went away, pt has some questions about her inhaler usage, and pt is confused about her meds bc they are changing.  History of Present Illness: 77 yo female with known hx of  COPD, OSA BPAP 10/7 with 3 L O2, and secondary pulmonary hypertension.  She noticed more of a cough earlier this month, but this has improved.  She was bringing up whitish sputum.  She did not have fever and her sinuses have been okay.  She has been feeling better over the past few days.  She uses her nebulizer 2 or 3 times per day and her proair 2 or 3 times per day.  She was only using 1.5 liters oxygen with her BPAP.  She thinks she changed the setting accidentally when she was cleaning.  She has been using 3 liters oxygen during the day.  She gets some leg swelling, but this gets better when she keeps her legs up.  She has been sleeping well with BPAP.  She has been getting trouble from her mask, but she has been working with her DME to get this corrected.   Preventive Screening-Counseling & Management  Alcohol-Tobacco     Smoking Status: quit     Year Quit: 1999     Pack years: 30+ pack years     Passive Smoke Exposure: no  Caffeine-Diet-Exercise     Caffeine use/day: 0     Does Patient Exercise: no  Current Medications (verified): 1)  Spiriva Handihaler 18 Mcg Caps (Tiotropium Bromide Monohydrate) .... One Puff Once Daily 2)  Advair Diskus 250-50 Mcg/dose Misc (Fluticasone-Salmeterol) .... One Puff Two Times A Day 3)  Proair Hfa 108 (90 Base) Mcg/act Aers (Albuterol Sulfate) .... Two Puffs Puff Up To Four Times Per Day As Needed 4)  Albuterol Sulfate (2.5 Mg/83ml) 0.083% Nebu (Albuterol Sulfate) .... One Vial Nebulized Up To Four Times Per Day As Needed For Cough,  Wheeze, Congestion, or Shortness of Breath 5)  Glimepiride 2 Mg Tabs (Glimepiride) .Marland Kitchen.. 1 By Mouth Once Daily 6)  Ed K+10 10 Meq Cr-Tabs (Potassium Chloride) .Marland Kitchen.. 1 By Mouth Two Times A Day or 2 Tabs Once Daily 7)  Fluoxetine Hcl 20 Mg  Tabs (Fluoxetine Hcl) .... Once Daily 8)  Plavix 75 Mg  Tabs (Clopidogrel Bisulfate) .... Once Daily 9)  Furosemide 40 Mg  Tabs (Furosemide) .... Once Daily 10)  Bl Aspirin 325 Mg  Tabs (Aspirin) .... Once Daily 11)  Isosorbide Mononitrate Cr 60 Mg  Tb24 (Isosorbide Mononitrate) .... 1.5 Tabs Once Daily 12)  Toprol Xl 50 Mg Xr24h-Tab (Metoprolol Succinate) .Marland Kitchen.. 1 By Mouth Once Daily 13)  Amlodipine Besylate 5 Mg  Tabs (Amlodipine Besylate) .... Once Daily 14)  Alprazolam 0.25 Mg  Tabs (Alprazolam) .... Two Times A Day 15)  Metformin Hcl 500 Mg  Tabs (Metformin Hcl) .... Two Times A Day 16)  Simvastatin 80 Mg Tabs (Simvastatin) .Marland Kitchen.. 1 By Mouth Qpm 17)  Oxybutynin Chloride 10 Mg  Tb24 (Oxybutynin Chloride) .... Once Daily 18)  Nitroglycerin 0.4 Mg  Subl (Nitroglycerin) .... As Needed 19)  Multivitamins   Tabs (Multiple Vitamin) .... Take 1 Tablet By Mouth Once A Day 20)  Amitriptyline Hcl 25 Mg Tabs (Amitriptyline Hcl) .Marland Kitchen.. 1 By  Mouth At Bedtime For Peripheral Neuropathy. May Increase To 2 Tablets If Needed. 21)  Mucus Relief 400 Mg Tabs (Guaifenesin) .... 2 Every 12 Hours As Needed 22)  Sennosides-Docusate Sodium 8.6-50 Mg Tabs (Sennosides-Docusate Sodium) .... Once Daily 23)  Bipap .... At Bedtime 10/7 Cm of Water  With 3l Oxygen 24)  Oxygen .Marland Kitchen.. 3l Via Nasal Cannula Continuously 25)  Omeprazole 20 Mg Tbec (Omeprazole) .... I Cap Once Daily 26)  Xopenex 1.25 Mg/61ml Nebu (Levalbuterol Hcl) .Marland Kitchen.. 1 Vial Up To 4 Times A Day  Allergies: 1)  ! * Iv Contrast  Past History:  Past Medical History: COPD      - PFT 06/19/08 FEV1 1.18(68%), FEV1% 63, TLC 3.15(72%), DLCO 43%, +BD OSA      -  PSG 09/27/07 AHI 11, O2 nadir 56%      - BPAP 10/7 cm H2O Hypoxemia        - Room air SaO2 87% from 03/19/09       - 3 liters O2 Secondary pulmonary hypertension Coronary artery disease-LAD, RCA, 50% Left main, Dx. S/p PTCA Diabetes mellitus, type II Hyperlipidemia Hypertension Peripheral vascular disease-s/p iliac stent bilateral, AAA stent tobacco abuse constipation  Past Surgical History: Reviewed history from 06/12/2008 and no changes required. Carotid endarterectomy- left, right and one side red. Hysterectomy AAA stent bilateral iliac stenting left thoracotomy 1966 resection of vocal chord lesions excision serous cyst adenofibroma  Vital Signs:  Patient profile:   77 year old female Height:      61 inches Weight:      21.2 pounds O2 Sat:      93 % on 3 L/min Temp:     97.4 degrees F oral Pulse rate:   76 / minute BP sitting:   110 / 72  (right arm) Cuff size:   large  Vitals Entered By: Carver Fila (March 24, 2010 8:45 AM)  O2 Flow:  3 L/min CC: Follow up, breathing is about the same, pt states she has been getting short winded often, pt states she had a cough for a week but then it went away, pt has some questions about her inhaler usage, pt is confused about her meds bc they are changing Comments meds and allergies updated Phone number updated Carver Fila  March 24, 2010 9:00 AM    Physical Exam  General:  on supplemental oxygen and obese.   Nose:  no deformity, discharge, inflammation, or lesions Mouth:  no deformity or lesions, wears dentures Neck:  no JVD.   Lungs:  decreased breath sounds, no wheezing Heart:  regular rhythm, normal rate, and no murmurs.   Extremities:  minimal ankle edema Cervical Nodes:  no significant adenopathy   Impression & Recommendations:  Problem # 1:  COPD (ICD-496) She is to continue on her current inhaler regimen.  She will complete her current prescription for xopenex and then change to albuterol in her nebulizer.  Problem # 2:  HYPOXEMIA (ICD-799.02)  She is to continue on 3 liters oxygen  24/7.  Problem # 3:  OBSTRUCTIVE SLEEP APNEA (ICD-327.23)  She is doing well with BPAP.  She will check with her DME about getting her mask fit properly.  Medications Added to Medication List This Visit: 1)  Xopenex 1.25 Mg/25ml Nebu (Levalbuterol hcl) .Marland Kitchen.. 1 vial up to 4 times a day 2)  Omeprazole 20 Mg Tbec (Omeprazole) .... I cap once daily  Complete Medication List: 1)  Spiriva Handihaler 18 Mcg Caps (Tiotropium bromide monohydrate) .... One  puff once daily 2)  Advair Diskus 250-50 Mcg/dose Misc (Fluticasone-salmeterol) .... One puff two times a day 3)  Xopenex 1.25 Mg/85ml Nebu (Levalbuterol hcl) .Marland Kitchen.. 1 vial up to 4 times a day 4)  Proair Hfa 108 (90 Base) Mcg/act Aers (Albuterol sulfate) .... Two puffs puff up to four times per day as needed 5)  Albuterol Sulfate (2.5 Mg/16ml) 0.083% Nebu (Albuterol sulfate) .... One vial nebulized up to four times per day as needed for cough, wheeze, congestion, or shortness of breath 6)  Glimepiride 2 Mg Tabs (Glimepiride) .Marland Kitchen.. 1 by mouth once daily 7)  Ed K+10 10 Meq Cr-tabs (Potassium chloride) .Marland Kitchen.. 1 by mouth two times a day or 2 tabs once daily 8)  Fluoxetine Hcl 20 Mg Tabs (Fluoxetine hcl) .... Once daily 9)  Plavix 75 Mg Tabs (Clopidogrel bisulfate) .... Once daily 10)  Furosemide 40 Mg Tabs (Furosemide) .... Once daily 11)  Bl Aspirin 325 Mg Tabs (Aspirin) .... Once daily 12)  Isosorbide Mononitrate Cr 60 Mg Tb24 (Isosorbide mononitrate) .... 1.5 tabs once daily 13)  Toprol Xl 50 Mg Xr24h-tab (Metoprolol succinate) .Marland Kitchen.. 1 by mouth once daily 14)  Amlodipine Besylate 5 Mg Tabs (Amlodipine besylate) .... Once daily 15)  Alprazolam 0.25 Mg Tabs (Alprazolam) .... Two times a day 16)  Metformin Hcl 500 Mg Tabs (Metformin hcl) .... Two times a day 17)  Simvastatin 80 Mg Tabs (Simvastatin) .Marland Kitchen.. 1 by mouth qpm 18)  Oxybutynin Chloride 10 Mg Tb24 (Oxybutynin chloride) .... Once daily 19)  Nitroglycerin 0.4 Mg Subl (Nitroglycerin) .... As needed 20)   Multivitamins Tabs (Multiple vitamin) .... Take 1 tablet by mouth once a day 21)  Amitriptyline Hcl 25 Mg Tabs (Amitriptyline hcl) .Marland Kitchen.. 1 by mouth at bedtime for peripheral neuropathy. may increase to 2 tablets if needed. 22)  Mucus Relief 400 Mg Tabs (Guaifenesin) .... 2 every 12 hours as needed 23)  Sennosides-docusate Sodium 8.6-50 Mg Tabs (Sennosides-docusate sodium) .... Once daily 24)  Bipap  .... At bedtime 10/7 cm of water  with 3l oxygen 25)  Oxygen  .Marland Kitchen.. 3l via nasal cannula continuously 26)  Omeprazole 20 Mg Tbec (Omeprazole) .... I cap once daily  Other Orders: Est. Patient Level III ([address])  Patient Instructions: 1)  Follow up in 4 to 6 months

## 2010-10-06 NOTE — Progress Notes (Signed)
    Immunization History:  Influenza Immunization History:    Influenza:  0.67ml right deltoid lot # [id number] (06/01/2010)

## 2010-10-06 NOTE — Progress Notes (Signed)
  Phone Note Outgoing Call   Reason for Call: Discuss lab or test results Summary of Call: please call patinet or her daughter: 1) hip x-ray with little evidence of arthritis. She should ask Dr. Allyson Sabal about taking cilostazol. 2) U/A negative.  Thanks Initial call taken by: Jacques Navy MD,  July 22, 2010 6:51 PM  Follow-up for Phone Call        informed pt Follow-up by: Ami Bullins CMA,  July 23, 2010 4:01 PM

## 2010-10-06 NOTE — Assessment & Plan Note (Signed)
Summary: right groin pain/cd   Vital Signs:  Patient profile:   77 year old female Height:      61 inches Weight:      210 pounds BMI:     39.82 O2 Sat:      95 % on 3 L/min Temp:     97.6 degrees F oral Pulse rate:   79 / minute BP sitting:   104 / 60  (left arm) Cuff size:   large  Vitals Entered By: Bill Salinas CMA (July 22, 2010 9:39 AM)  O2 Flow:  3 L/min CC: pt c/o pain in her right groin area and has c/o continued lower abd pain/ ab   Primary Care Provider:  Illene Regulus  CC:  pt c/o pain in her right groin area and has c/o continued lower abd pain/ ab.  History of Present Illness: Patient presents c/o pain in the right groin. this has been present for many weeks. She has trouble with weight bearing due to the pain. She has had no injury. She denies paresthesia, muscle wasting or loss of strength.  She c/o of increased urinary frequency, slowed initiation of stream and low abdominal/supra-pubic discomfort. No fever or chills, no hematuria.  Patient c/o early satiety, increased heartburn despite omeprazole 20mg , increased eructation.  Patient has recently seen Dr. Allyson Sabal who informed her of significant peripheral vascular disease at the popliteal artery. Daughter reports that Dr. Allyson Sabal had suggested she consider AL, that she consider a living will in light of her coronary disease, peripheral vascular disease and other medical problems.  Current Medications (verified): 1)  Spiriva Handihaler 18 Mcg Caps (Tiotropium Bromide Monohydrate) .... One Puff Once Daily 2)  Advair Diskus 250-50 Mcg/dose Misc (Fluticasone-Salmeterol) .... One Puff Two Times A Day 3)  Xopenex 1.25 Mg/23ml Nebu (Levalbuterol Hcl) .Marland Kitchen.. 1 Vial Up To 4 Times A Day 4)  Proair Hfa 108 (90 Base) Mcg/act Aers (Albuterol Sulfate) .... Two Puffs Puff Up To Four Times Per Day As Needed 5)  Albuterol Sulfate (2.5 Mg/12ml) 0.083% Nebu (Albuterol Sulfate) .... One Vial Nebulized Up To Four Times Per Day As  Needed For Cough, Wheeze, Congestion, or Shortness of Breath 6)  Glimepiride 2 Mg Tabs (Glimepiride) .Marland Kitchen.. 1 By Mouth Once Daily 7)  Ed K+10 10 Meq Cr-Tabs (Potassium Chloride) .Marland Kitchen.. 1 By Mouth Two Times A Day or 2 Tabs Once Daily 8)  Fluoxetine Hcl 20 Mg  Tabs (Fluoxetine Hcl) .... Once Daily 9)  Plavix 75 Mg  Tabs (Clopidogrel Bisulfate) .... Once Daily 10)  Furosemide 40 Mg  Tabs (Furosemide) .... Once Daily 11)  Bl Aspirin 325 Mg  Tabs (Aspirin) .... Once Daily 12)  Isosorbide Mononitrate Cr 60 Mg  Tb24 (Isosorbide Mononitrate) .... 1.5 Tabs Once Daily 13)  Toprol Xl 50 Mg Xr24h-Tab (Metoprolol Succinate) .Marland Kitchen.. 1 By Mouth Once Daily 14)  Amlodipine Besylate 5 Mg  Tabs (Amlodipine Besylate) .... Once Daily 15)  Alprazolam 0.25 Mg  Tabs (Alprazolam) .... Two Times A Day 16)  Metformin Hcl 500 Mg  Tabs (Metformin Hcl) .... Two Times A Day 17)  Simvastatin 80 Mg Tabs (Simvastatin) .Marland Kitchen.. 1 By Mouth Qpm 18)  Oxybutynin Chloride 10 Mg  Tb24 (Oxybutynin Chloride) .... Once Daily 19)  Nitroglycerin 0.4 Mg  Subl (Nitroglycerin) .... As Needed 20)  Multivitamins   Tabs (Multiple Vitamin) .... Take 1 Tablet By Mouth Once A Day 21)  Amitriptyline Hcl 25 Mg Tabs (Amitriptyline Hcl) .Marland Kitchen.. 1 By Mouth At Bedtime For Peripheral Neuropathy.  May Increase To 2 Tablets If Needed. 22)  Mucus Relief 400 Mg Tabs (Guaifenesin) .... 2 Every 12 Hours As Needed 23)  Sennosides-Docusate Sodium 8.6-50 Mg Tabs (Sennosides-Docusate Sodium) .... Once Daily 24)  Bipap .... At Bedtime 10/7 Cm of Water  With 3l Oxygen 25)  Oxygen .Marland Kitchen.. 3l Via Nasal Cannula Continuously 26)  Omeprazole 20 Mg Tbec (Omeprazole) .... I Cap Once Daily  Allergies (verified): 1)  ! * Iv Contrast  Past History:  Past Medical History: Last updated: 03/24/2010 COPD      - PFT 06/19/08 FEV1 1.18(68%), FEV1% 63, TLC 3.15(72%), DLCO 43%, +BD OSA      -  PSG 09/27/07 AHI 11, O2 nadir 56%      - BPAP 10/7 cm H2O Hypoxemia       - Room air SaO2 87%  from 03/19/09       - 3 liters O2 Secondary pulmonary hypertension Coronary artery disease-LAD, RCA, 50% Left main, Dx. S/p PTCA Diabetes mellitus, type II Hyperlipidemia Hypertension Peripheral vascular disease-s/p iliac stent bilateral, AAA stent tobacco abuse constipation  Past Surgical History: Last updated: 06/12/2008 Carotid endarterectomy- left, right and one side red. Hysterectomy AAA stent bilateral iliac stenting left thoracotomy 1966 resection of vocal chord lesions excision serous cyst adenofibroma  Social History: widowed. Daughters are very supportive - which allows her to live alone End-of-Life care: discussed living will, HCPOA and MOST form. All are provided to the patient for her to consider with the help of her family. Out of Facilty Order and blank MOST form signed. (07/22/10)  Review of Systems       The patient complains of anorexia, decreased hearing, dyspnea on exertion, peripheral edema, abdominal pain, severe indigestion/heartburn, incontinence, muscle weakness, and difficulty walking.  The patient denies fever, weight loss, weight gain, vision loss, hoarseness, chest pain, prolonged cough, headaches, melena, hematochezia, hematuria, suspicious skin lesions, depression, abnormal bleeding, and enlarged lymph nodes.    Physical Exam  General:  Morbidly obese white woman in no acute distress Head:  Normocephalic and atraumatic without obvious abnormalities. No apparent alopecia or balding. Eyes:  pupils equal and pupils round.  C&S clear Neck:  supple.   Lungs:  On oxygen, normal respiratory effort, normal breath sounds, and no wheezes.   Heart:  normal rate and regular rhythm.   Abdomen:  obese, normal bowel sounds.   Msk:  no joint tenderness, no joint swelling, and no redness over joints.   Pulses:  2+ radial Neurologic:  alert & oriented X3 and cranial nerves II-XII intact except for hearing loss.  Gait hindered - uses cane Psych:  normally  interactive and good eye contact.     Impression & Recommendations:  Problem # 1:  HIP PAIN, RIGHT (ICD-719.45) Pt with persistent right hip pain. Exam is limited by obesity and respiratory limitations. She may have OA vs PVD related claudication.  Plan - hip films  Her updated medication list for this problem includes:    Bl Aspirin 325 Mg Tabs (Aspirin) ..... Once daily  Orders: T-Hip Comp Right Min 2 views (73510TC)  DG HIP COMPLETE*R* - [id number]   Clinical Data: Right hip pain, recent fall   RIGHT HIP - COMPLETE 2+ VIEW   Comparison: None   Findings: Osseous demineralization. Minimal narrowing of hip joints bilaterally. Symmetric SI joints. No acute fracture, dislocation, or bone destruction. Soft tissues unremarkable.   IMPRESSION: No acute abnormalities.   Read By:  Lollie Marrow,  M.D.  Pain may be  claudication. She may benefit from cilostazol  Problem # 2:  DYSURIA (ICD-788.1) symptoms suggestive of cystitis.  Plan - U/a - cipro if positive.  Her updated medication list for this problem includes:    Oxybutynin Chloride 10 Mg Tb24 (Oxybutynin chloride) ..... Once daily    Ciprofloxacin Hcl 250 Mg Tabs (Ciprofloxacin hcl) .Marland Kitchen... 1 by mouth two times a day for uti if we call with positive u/a  Orders: TLB-Udip w/ Micro (81001-URINE)  addendum - U/A with rare bacteria but only 2 WBC per hpf  Patient advised to NOT take antibiotic.  Problem # 3:  DYSPEPSIA, CHRONIC (ICD-536.8) Worsening dyspepsia as probable cause for early satiety, nause, reflux.  Plan - increase omeprazole to 40mg  qAM  Problem # 4:  End of Life Care Discussion with patient through her daughter (patient hard of hearing , doctor with laryngitis). Reviewed  poor statistical outcomes for her from cardiac resuscitation or mechanical ventilation. Reveiwed HCPOA form, Living Will forms and provided packet. Reviewed MOST and provided blank signed form. Reviewed Out of Facility DNR Order -  provided signed copy.  Daughter assures me that the family will discuss this together.   Complete Medication List: 1)  Spiriva Handihaler 18 Mcg Caps (Tiotropium bromide monohydrate) .... One puff once daily 2)  Advair Diskus 250-50 Mcg/dose Misc (Fluticasone-salmeterol) .... One puff two times a day 3)  Xopenex 1.25 Mg/48ml Nebu (Levalbuterol hcl) .Marland Kitchen.. 1 vial up to 4 times a day 4)  Proair Hfa 108 (90 Base) Mcg/act Aers (Albuterol sulfate) .... Two puffs puff up to four times per day as needed 5)  Albuterol Sulfate (2.5 Mg/31ml) 0.083% Nebu (Albuterol sulfate) .... One vial nebulized up to four times per day as needed for cough, wheeze, congestion, or shortness of breath 6)  Glimepiride 2 Mg Tabs (Glimepiride) .Marland Kitchen.. 1 by mouth once daily 7)  Ed K+10 10 Meq Cr-tabs (Potassium chloride) .Marland Kitchen.. 1 by mouth two times a day or 2 tabs once daily 8)  Fluoxetine Hcl 20 Mg Tabs (Fluoxetine hcl) .... Once daily 9)  Plavix 75 Mg Tabs (Clopidogrel bisulfate) .... Once daily 10)  Furosemide 40 Mg Tabs (Furosemide) .... Once daily 11)  Bl Aspirin 325 Mg Tabs (Aspirin) .... Once daily 12)  Isosorbide Mononitrate Cr 60 Mg Tb24 (Isosorbide mononitrate) .... 1.5 tabs once daily 13)  Toprol Xl 50 Mg Xr24h-tab (Metoprolol succinate) .Marland Kitchen.. 1 by mouth once daily 14)  Amlodipine Besylate 5 Mg Tabs (Amlodipine besylate) .... Once daily 15)  Alprazolam 0.25 Mg Tabs (Alprazolam) .... Two times a day 16)  Metformin Hcl 500 Mg Tabs (Metformin hcl) .... Two times a day 17)  Simvastatin 80 Mg Tabs (Simvastatin) .Marland Kitchen.. 1 by mouth qpm 18)  Oxybutynin Chloride 10 Mg Tb24 (Oxybutynin chloride) .... Once daily 19)  Nitroglycerin 0.4 Mg Subl (Nitroglycerin) .... As needed 20)  Multivitamins Tabs (Multiple vitamin) .... Take 1 tablet by mouth once a day 21)  Amitriptyline Hcl 25 Mg Tabs (Amitriptyline hcl) .Marland Kitchen.. 1 by mouth at bedtime for peripheral neuropathy. may increase to 2 tablets if needed. 22)  Mucus Relief 400 Mg Tabs  (Guaifenesin) .... 2 every 12 hours as needed 23)  Sennosides-docusate Sodium 8.6-50 Mg Tabs (Sennosides-docusate sodium) .... Once daily 24)  Bipap  .... At bedtime 10/7 cm of water  with 3l oxygen 25)  Oxygen  .Marland Kitchen.. 3l via nasal cannula continuously 26)  Omeprazole 40 Mg Cpdr (Omeprazole) .Marland Kitchen.. 1 by mouth q am 27)  Ciprofloxacin Hcl 250 Mg Tabs (Ciprofloxacin hcl) .Marland KitchenMarland KitchenMarland Kitchen  1 by mouth two times a day for uti if we call with positive u/a  Patient Instructions: 1)  Hip pain - may be arthritis but could  be circulation. Plan - hip x-ray. If there is arthiritis we will find a way to treat that won't interfere with you medications or upset your stomach. 2)  Urinary tract infection - your symptoms suggest a UTI. Will get a U/A. Fill the prescription for cipro if we call you with a positive urinalysis. 3)  Nausea and increased heartburn - plan increase omeprazole to 40mg  (2x20mg ). If this helps let me know and we can call in a new prescription. 4)  End of life care - review the documents provided. complete them to your wishes and provide a copy to me and to Dr. Allyson Sabal.  5)  Continue all your other medications.  Prescriptions: CIPROFLOXACIN HCL 250 MG TABS (CIPROFLOXACIN HCL) 1 by mouth two times a day for UTI if we call with positive U/A  #10 x 0   Entered and Authorized by:   Jacques Navy MD   Signed by:   Jacques Navy MD on 07/22/2010   Method used:   Handwritten   RxID:   [id number]    Orders Added: 1)  T-Hip Comp Right Min 2 views [73510TC] 2)  TLB-Udip w/ Micro [81001-URINE] 3)  Est. Patient Level IV [56213]

## 2010-10-06 NOTE — Assessment & Plan Note (Signed)
Summary: pain in upper right abdomen-lb   Vital Signs:  Patient profile:   77 year old female Height:      61 inches Weight:      215 pounds BMI:     40.77 O2 Sat:      96 % on 3 L/min Temp:     97.8 degrees F oral Pulse rate:   76 / minute BP sitting:   124 / 68  (left arm) Cuff size:   large  Vitals Entered By: Bill Salinas CMA (June 19, 2010 4:45 PM)  O2 Flow:  3 L/min CC: pt here with c/o  RUQ pain x 2 days, pain worse with deep breath and decreased appetite/ ab   Primary Care Provider:  Illene Regulus  CC:  pt here with c/o  RUQ pain x 2 days and pain worse with deep breath and decreased appetite/ ab.  History of Present Illness: Patient presents with RUQ and right sided pain. This has had sudden on-set over the past several days. She has had no GI symptoms: no N/V, no diarrhea, no food intolerance. She has no pain that radiates back to the scapula.  Current Medications (verified): 1)  Spiriva Handihaler 18 Mcg Caps (Tiotropium Bromide Monohydrate) .... One Puff Once Daily 2)  Advair Diskus 250-50 Mcg/dose Misc (Fluticasone-Salmeterol) .... One Puff Two Times A Day 3)  Xopenex 1.25 Mg/33ml Nebu (Levalbuterol Hcl) .Marland Kitchen.. 1 Vial Up To 4 Times A Day 4)  Proair Hfa 108 (90 Base) Mcg/act Aers (Albuterol Sulfate) .... Two Puffs Puff Up To Four Times Per Day As Needed 5)  Albuterol Sulfate (2.5 Mg/53ml) 0.083% Nebu (Albuterol Sulfate) .... One Vial Nebulized Up To Four Times Per Day As Needed For Cough, Wheeze, Congestion, or Shortness of Breath 6)  Glimepiride 2 Mg Tabs (Glimepiride) .Marland Kitchen.. 1 By Mouth Once Daily 7)  Ed K+10 10 Meq Cr-Tabs (Potassium Chloride) .Marland Kitchen.. 1 By Mouth Two Times A Day or 2 Tabs Once Daily 8)  Fluoxetine Hcl 20 Mg  Tabs (Fluoxetine Hcl) .... Once Daily 9)  Plavix 75 Mg  Tabs (Clopidogrel Bisulfate) .... Once Daily 10)  Furosemide 40 Mg  Tabs (Furosemide) .... Once Daily 11)  Bl Aspirin 325 Mg  Tabs (Aspirin) .... Once Daily 12)  Isosorbide Mononitrate Cr 60 Mg   Tb24 (Isosorbide Mononitrate) .... 1.5 Tabs Once Daily 13)  Toprol Xl 50 Mg Xr24h-Tab (Metoprolol Succinate) .Marland Kitchen.. 1 By Mouth Once Daily 14)  Amlodipine Besylate 5 Mg  Tabs (Amlodipine Besylate) .... Once Daily 15)  Alprazolam 0.25 Mg  Tabs (Alprazolam) .... Two Times A Day 16)  Metformin Hcl 500 Mg  Tabs (Metformin Hcl) .... Two Times A Day 17)  Simvastatin 80 Mg Tabs (Simvastatin) .Marland Kitchen.. 1 By Mouth Qpm 18)  Oxybutynin Chloride 10 Mg  Tb24 (Oxybutynin Chloride) .... Once Daily 19)  Nitroglycerin 0.4 Mg  Subl (Nitroglycerin) .... As Needed 20)  Multivitamins   Tabs (Multiple Vitamin) .... Take 1 Tablet By Mouth Once A Day 21)  Amitriptyline Hcl 25 Mg Tabs (Amitriptyline Hcl) .Marland Kitchen.. 1 By Mouth At Bedtime For Peripheral Neuropathy. May Increase To 2 Tablets If Needed. 22)  Mucus Relief 400 Mg Tabs (Guaifenesin) .... 2 Every 12 Hours As Needed 23)  Sennosides-Docusate Sodium 8.6-50 Mg Tabs (Sennosides-Docusate Sodium) .... Once Daily 24)  Bipap .... At Bedtime 10/7 Cm of Water  With 3l Oxygen 25)  Oxygen .Marland Kitchen.. 3l Via Nasal Cannula Continuously 26)  Omeprazole 20 Mg Tbec (Omeprazole) .... I Cap Once Daily  Allergies (verified): 1)  ! * Iv Contrast  Past History:  Past Medical History: Last updated: 03/24/2010 COPD      - PFT 06/19/08 FEV1 1.18(68%), FEV1% 63, TLC 3.15(72%), DLCO 43%, +BD OSA      -  PSG 09/27/07 AHI 11, O2 nadir 56%      - BPAP 10/7 cm H2O Hypoxemia       - Room air SaO2 87% from 03/19/09       - 3 liters O2 Secondary pulmonary hypertension Coronary artery disease-LAD, RCA, 50% Left main, Dx. S/p PTCA Diabetes mellitus, type II Hyperlipidemia Hypertension Peripheral vascular disease-s/p iliac stent bilateral, AAA stent tobacco abuse constipation  Past Surgical History: Last updated: 06/12/2008 Carotid endarterectomy- left, right and one side red. Hysterectomy AAA stent bilateral iliac stenting left thoracotomy 1966 resection of vocal chord lesions excision  serous cyst adenofibroma  Family History: Last updated: 06/12/2008 non-contributory  Social History: Last updated: 07/03/2009 widowed.  Review of Systems  The patient denies anorexia, fever, weight loss, weight gain, chest pain, syncope, dyspnea on exertion, peripheral edema, hemoptysis, abdominal pain, severe indigestion/heartburn, incontinence, muscle weakness, transient blindness, difficulty walking, unusual weight change, abnormal bleeding, and angioedema.    Physical Exam  General:  obese chronically ill appearing white woman in no acute distress Head:  Normocephalic and atraumatic without obvious abnormalities. No apparent alopecia or balding. Eyes:  C&S clear Neck:  supple and full ROM.   Chest Wall:  no deformities.   Lungs:  normal respiratory effort and normal breath sounds.   Heart:  normal rate and regular rhythm.   Abdomen:  obese, no guarding or rebound, no tenderness to deep palpation RUQ or epigastrum. She has tenderness to palpation of the abdominal wall musculature and soft tissue in the axilla. Msk:  no joint tenderness, no joint swelling, no joint warmth, and no joint instability.   Pulses:  2+ Neurologic:  alert & oriented X3 and cranial nerves II-XII intact.   Skin:  turgor normal.  Some pallor. Cervical Nodes:  no anterior cervical adenopathy and no posterior cervical adenopathy.   Psych:  memory intact for recent and remote, normally interactive, good eye contact, and not anxious appearing.     Impression & Recommendations:  Problem # 1:  ABDOMINAL WALL PAIN (ICD-789.09) Based on history and exam no evidence acute GI problem. suspect muscle strain of some time.   Plan - lineament of choice          heat pads.  Her updated medication list for this problem includes:    Bl Aspirin 325 Mg Tabs (Aspirin) ..... Once daily  Complete Medication List: 1)  Spiriva Handihaler 18 Mcg Caps (Tiotropium bromide monohydrate) .... One puff once daily 2)  Advair  Diskus 250-50 Mcg/dose Misc (Fluticasone-salmeterol) .... One puff two times a day 3)  Xopenex 1.25 Mg/46ml Nebu (Levalbuterol hcl) .Marland Kitchen.. 1 vial up to 4 times a day 4)  Proair Hfa 108 (90 Base) Mcg/act Aers (Albuterol sulfate) .... Two puffs puff up to four times per day as needed 5)  Albuterol Sulfate (2.5 Mg/6ml) 0.083% Nebu (Albuterol sulfate) .... One vial nebulized up to four times per day as needed for cough, wheeze, congestion, or shortness of breath 6)  Glimepiride 2 Mg Tabs (Glimepiride) .Marland Kitchen.. 1 by mouth once daily 7)  Ed K+10 10 Meq Cr-tabs (Potassium chloride) .Marland Kitchen.. 1 by mouth two times a day or 2 tabs once daily 8)  Fluoxetine Hcl 20 Mg Tabs (Fluoxetine hcl) .... Once daily 9)  Plavix 75 Mg Tabs (Clopidogrel bisulfate) .... Once daily 10)  Furosemide 40 Mg Tabs (Furosemide) .... Once daily 11)  Bl Aspirin 325 Mg Tabs (Aspirin) .... Once daily 12)  Isosorbide Mononitrate Cr 60 Mg Tb24 (Isosorbide mononitrate) .... 1.5 tabs once daily 13)  Toprol Xl 50 Mg Xr24h-tab (Metoprolol succinate) .Marland Kitchen.. 1 by mouth once daily 14)  Amlodipine Besylate 5 Mg Tabs (Amlodipine besylate) .... Once daily 15)  Alprazolam 0.25 Mg Tabs (Alprazolam) .... Two times a day 16)  Metformin Hcl 500 Mg Tabs (Metformin hcl) .... Two times a day 17)  Simvastatin 80 Mg Tabs (Simvastatin) .Marland Kitchen.. 1 by mouth qpm 18)  Oxybutynin Chloride 10 Mg Tb24 (Oxybutynin chloride) .... Once daily 19)  Nitroglycerin 0.4 Mg Subl (Nitroglycerin) .... As needed 20)  Multivitamins Tabs (Multiple vitamin) .... Take 1 tablet by mouth once a day 21)  Amitriptyline Hcl 25 Mg Tabs (Amitriptyline hcl) .Marland Kitchen.. 1 by mouth at bedtime for peripheral neuropathy. may increase to 2 tablets if needed. 22)  Mucus Relief 400 Mg Tabs (Guaifenesin) .... 2 every 12 hours as needed 23)  Sennosides-docusate Sodium 8.6-50 Mg Tabs (Sennosides-docusate sodium) .... Once daily 24)  Bipap  .... At bedtime 10/7 cm of water  with 3l oxygen 25)  Oxygen  .Marland Kitchen.. 3l via nasal  cannula continuously 26)  Omeprazole 20 Mg Tbec (Omeprazole) .... I cap once daily   Orders Added: 1)  Est. Patient Level III [60454]

## 2010-10-06 NOTE — Assessment & Plan Note (Signed)
Summary: diarrhea/SD   Vital Signs:  Patient profile:   77 year old female Height:      62 inches Weight:      212 pounds BMI:     38.92 O2 Sat:      98 % on Room air Temp:     97.9 degrees F oral Pulse rate:   70 / minute BP sitting:   122 / 60  (left arm) Cuff size:   large  Vitals Entered By: Bill Salinas CMA (August 06, 2010 9:07 AM)  O2 Flow:  Room air CC: pt here with c/o diarrhea x 1 week, pts daughter states her stool does appear to have coffee grain looking substance in her stool/ ab Comments pt is no longer taking cilostazol and has not started pentoxifylline/ ab   Primary Care Provider:  Illene Regulus  CC:  pt here with c/o diarrhea x 1 week and pts daughter states her stool does appear to have coffee grain looking substance in her stool/ ab.  History of Present Illness: Patient presents for a 1 week h/o diarrhea - 2-3 stools a day; watery with some solid material, no blood, no mucus. Describes a soupy appearance with coffee grounds. She has had no fever.She did have migrating abdominal pain that kept her up at night but is presently pain free. She has been able to eat and drink fluids.   She was seen for abdominal pain and was treated with Augmentin after negative CT abd/pelvis.  Patient is a vasculopath and some of her leg pain was thought to be claudication, also lower abdomen pain may have been from iliac disease. She was prescribed cilostazol but this was discarded due to interaction with plavix. Trental has been prescribed but not yet started.   Current Medications (verified): 1)  Spiriva Handihaler 18 Mcg Caps (Tiotropium Bromide Monohydrate) .... One Puff Once Daily 2)  Advair Diskus 250-50 Mcg/dose Misc (Fluticasone-Salmeterol) .... One Puff Two Times A Day 3)  Xopenex 1.25 Mg/60ml Nebu (Levalbuterol Hcl) .Marland Kitchen.. 1 Vial Up To 4 Times A Day 4)  Proair Hfa 108 (90 Base) Mcg/act Aers (Albuterol Sulfate) .... Two Puffs Puff Up To Four Times Per Day As Needed 5)   Albuterol Sulfate (2.5 Mg/75ml) 0.083% Nebu (Albuterol Sulfate) .... One Vial Nebulized Up To Four Times Per Day As Needed For Cough, Wheeze, Congestion, or Shortness of Breath 6)  Glimepiride 2 Mg Tabs (Glimepiride) .Marland Kitchen.. 1 By Mouth Once Daily 7)  Ed K+10 10 Meq Cr-Tabs (Potassium Chloride) .Marland Kitchen.. 1 By Mouth Two Times A Day or 2 Tabs Once Daily 8)  Fluoxetine Hcl 20 Mg  Tabs (Fluoxetine Hcl) .... Once Daily 9)  Plavix 75 Mg  Tabs (Clopidogrel Bisulfate) .... Once Daily 10)  Furosemide 40 Mg  Tabs (Furosemide) .... Once Daily 11)  Bl Aspirin 325 Mg  Tabs (Aspirin) .... Once Daily 12)  Isosorbide Mononitrate Cr 60 Mg  Tb24 (Isosorbide Mononitrate) .... 1.5 Tabs Once Daily 13)  Toprol Xl 50 Mg Xr24h-Tab (Metoprolol Succinate) .Marland Kitchen.. 1 By Mouth Once Daily 14)  Amlodipine Besylate 5 Mg  Tabs (Amlodipine Besylate) .... Once Daily 15)  Alprazolam 0.25 Mg  Tabs (Alprazolam) .... Two Times A Day 16)  Metformin Hcl 500 Mg  Tabs (Metformin Hcl) .... Two Times A Day 17)  Simvastatin 80 Mg Tabs (Simvastatin) .Marland Kitchen.. 1 By Mouth Qpm 18)  Oxybutynin Chloride 10 Mg  Tb24 (Oxybutynin Chloride) .... Once Daily 19)  Nitroglycerin 0.4 Mg  Subl (Nitroglycerin) .... As Needed  20)  Multivitamins   Tabs (Multiple Vitamin) .... Take 1 Tablet By Mouth Once A Day 21)  Amitriptyline Hcl 25 Mg Tabs (Amitriptyline Hcl) .Marland Kitchen.. 1 By Mouth At Bedtime For Peripheral Neuropathy. May Increase To 2 Tablets If Needed. 22)  Mucus Relief 400 Mg Tabs (Guaifenesin) .... 2 Every 12 Hours As Needed 23)  Sennosides-Docusate Sodium 8.6-50 Mg Tabs (Sennosides-Docusate Sodium) .... Once Daily 24)  Bipap .... At Bedtime 10/7 Cm of Water  With 3l Oxygen 25)  Oxygen .Marland Kitchen.. 3l Via Nasal Cannula Continuously 26)  Omeprazole 40 Mg Cpdr (Omeprazole) .Marland Kitchen.. 1 By Mouth Q Am 27)  Cilostazol 100 Mg Tabs (Cilostazol) .Marland Kitchen.. 1 Two Times A Day 28)  Pentoxifylline Cr 400 Mg Cr-Tabs (Pentoxifylline) .Marland Kitchen.. 1 Three Times A Day  Allergies (verified): 1)  ! * Iv  Contrast  Past History:  Past Medical History: Last updated: 03/24/2010 COPD      - PFT 06/19/08 FEV1 1.18(68%), FEV1% 63, TLC 3.15(72%), DLCO 43%, +BD OSA      -  PSG 09/27/07 AHI 11, O2 nadir 56%      - BPAP 10/7 cm H2O Hypoxemia       - Room air SaO2 87% from 03/19/09       - 3 liters O2 Secondary pulmonary hypertension Coronary artery disease-LAD, RCA, 50% Left main, Dx. S/p PTCA Diabetes mellitus, type II Hyperlipidemia Hypertension Peripheral vascular disease-s/p iliac stent bilateral, AAA stent tobacco abuse constipation  Past Surgical History: Last updated: 06/12/2008 Carotid endarterectomy- left, right and one side red. Hysterectomy AAA stent bilateral iliac stenting left thoracotomy 1966 resection of vocal chord lesions excision serous cyst adenofibroma PMH-FH-SH reviewed-no changes except otherwise noted, FH reviewed for relevance, SH/Risk Factors reviewed for relevance  Review of Systems       The patient complains of abdominal pain.  The patient denies anorexia, fever, weight loss, chest pain, dyspnea on exertion, prolonged cough, melena, hematochezia, muscle weakness, suspicious skin lesions, and enlarged lymph nodes.    Physical Exam  General:  obese white female in no distress. Very pale Head:  normocephalic and atraumatic.   Eyes:  No scleral icterus Lungs:  normal respiratory effort and normal breath sounds.   Heart:  normal rate and regular rhythm.   Abdomen:  morbidly obese, BS+, Not tender to palpation Rectal:  NST, no external or internal hemorrhoids, no mass in the vault. Stool heme negative.  Neurologic:  alert & oriented X3 and gait normal.   Skin:  Very pale. No lesions. Psych:  normally interactive and good eye contact.     Impression & Recommendations:  Problem # 1:  DIARRHEA (ICD-787.91) Patient with a week of diarrhea without fever, chills, blood or mucus in the stool. She did experience diffuse abdominal pain but is now pain free.  Suspect antibiotic related diarrhea (augmentin)  Plan - hydrate          immodium AD otc          call back instructions given.   Complete Medication List: 1)  Spiriva Handihaler 18 Mcg Caps (Tiotropium bromide monohydrate) .... One puff once daily 2)  Advair Diskus 250-50 Mcg/dose Misc (Fluticasone-salmeterol) .... One puff two times a day 3)  Xopenex 1.25 Mg/66ml Nebu (Levalbuterol hcl) .Marland Kitchen.. 1 vial up to 4 times a day 4)  Proair Hfa 108 (90 Base) Mcg/act Aers (Albuterol sulfate) .... Two puffs puff up to four times per day as needed 5)  Albuterol Sulfate (2.5 Mg/54ml) 0.083% Nebu (Albuterol sulfate) .... One  vial nebulized up to four times per day as needed for cough, wheeze, congestion, or shortness of breath 6)  Glimepiride 2 Mg Tabs (Glimepiride) .Marland Kitchen.. 1 by mouth once daily 7)  Ed K+10 10 Meq Cr-tabs (Potassium chloride) .Marland Kitchen.. 1 by mouth two times a day or 2 tabs once daily 8)  Fluoxetine Hcl 20 Mg Tabs (Fluoxetine hcl) .... Once daily 9)  Plavix 75 Mg Tabs (Clopidogrel bisulfate) .... Once daily 10)  Furosemide 40 Mg Tabs (Furosemide) .... Once daily 11)  Bl Aspirin 325 Mg Tabs (Aspirin) .... Once daily 12)  Isosorbide Mononitrate Cr 60 Mg Tb24 (Isosorbide mononitrate) .... 1.5 tabs once daily 13)  Toprol Xl 50 Mg Xr24h-tab (Metoprolol succinate) .Marland Kitchen.. 1 by mouth once daily 14)  Amlodipine Besylate 5 Mg Tabs (Amlodipine besylate) .... Once daily 15)  Alprazolam 0.25 Mg Tabs (Alprazolam) .... Two times a day 16)  Metformin Hcl 500 Mg Tabs (Metformin hcl) .... Two times a day 17)  Simvastatin 80 Mg Tabs (Simvastatin) .Marland Kitchen.. 1 by mouth qpm 18)  Oxybutynin Chloride 10 Mg Tb24 (Oxybutynin chloride) .... Once daily 19)  Nitroglycerin 0.4 Mg Subl (Nitroglycerin) .... As needed 20)  Multivitamins Tabs (Multiple vitamin) .... Take 1 tablet by mouth once a day 21)  Amitriptyline Hcl 25 Mg Tabs (Amitriptyline hcl) .Marland Kitchen.. 1 by mouth at bedtime for peripheral neuropathy. may increase to 2 tablets if  needed. 22)  Mucus Relief 400 Mg Tabs (Guaifenesin) .... 2 every 12 hours as needed 23)  Sennosides-docusate Sodium 8.6-50 Mg Tabs (Sennosides-docusate sodium) .... Once daily 24)  Bipap  .... At bedtime 10/7 cm of water  with 3l oxygen 25)  Oxygen  .Marland Kitchen.. 3l via nasal cannula continuously 26)  Omeprazole 40 Mg Cpdr (Omeprazole) .Marland Kitchen.. 1 by mouth q am 27)  Cilostazol 100 Mg Tabs (Cilostazol) .Marland Kitchen.. 1 two times a day 28)  Pentoxifylline Cr 400 Mg Cr-tabs (Pentoxifylline) .Marland Kitchen.. 1 three times a day  Patient Instructions: 1)  diarrhea - no blood in stool today. No abdominal pain. suspect this may have been caused by the augmentin. Plan- 8 oz fluid for each loose stool. May use otc immodium as directed on the box for loose stools. Call for uncontrolled diarrhea more than 5 times a day or if there is blood in the stool or if you have fever or increased abdominal pain.    Orders Added: 1)  Est. Patient Level III [16109]

## 2010-10-06 NOTE — Assessment & Plan Note (Signed)
Summary: stomach pain  stc   Vital Signs:  Patient profile:   77 year old female Height:      62 inches Weight:      208 pounds BMI:     38.18 Temp:     98.6 degrees F oral Pulse rate:   80 / minute BP sitting:   138 / 70  (left arm) Cuff size:   large  Vitals Entered By: Lamar Sprinkles, CMA (July 27, 2010 9:19 AM) CC: Continued abd pain.    Primary Care Provider:  Illene Regulus  CC:  Continued abd pain. Marland Kitchen  History of Present Illness: Patient presents with persistent abdominal pain that is now involving the entire stomach. She is nauseated abut has not had any emesis. No fever, but did have chills. No diarrhea. No blood or mucus in the stool, but the stools were green. The pain will awaken her from sleep. The pain will wax and wane but leaves her with residual tenderness.   She was seen last week for right groin pain and supra-pubic pain. the hip x-ray was normal. The U/A was negative. She did have epigastric pain and was to increase her omeprazole to 40mg  a AM which she hasn't done yet.   Current Medications (verified): 1)  Spiriva Handihaler 18 Mcg Caps (Tiotropium Bromide Monohydrate) .... One Puff Once Daily 2)  Advair Diskus 250-50 Mcg/dose Misc (Fluticasone-Salmeterol) .... One Puff Two Times A Day 3)  Xopenex 1.25 Mg/1ml Nebu (Levalbuterol Hcl) .Marland Kitchen.. 1 Vial Up To 4 Times A Day 4)  Proair Hfa 108 (90 Base) Mcg/act Aers (Albuterol Sulfate) .... Two Puffs Puff Up To Four Times Per Day As Needed 5)  Albuterol Sulfate (2.5 Mg/24ml) 0.083% Nebu (Albuterol Sulfate) .... One Vial Nebulized Up To Four Times Per Day As Needed For Cough, Wheeze, Congestion, or Shortness of Breath 6)  Glimepiride 2 Mg Tabs (Glimepiride) .Marland Kitchen.. 1 By Mouth Once Daily 7)  Ed K+10 10 Meq Cr-Tabs (Potassium Chloride) .Marland Kitchen.. 1 By Mouth Two Times A Day or 2 Tabs Once Daily 8)  Fluoxetine Hcl 20 Mg  Tabs (Fluoxetine Hcl) .... Once Daily 9)  Plavix 75 Mg  Tabs (Clopidogrel Bisulfate) .... Once Daily 10)   Furosemide 40 Mg  Tabs (Furosemide) .... Once Daily 11)  Bl Aspirin 325 Mg  Tabs (Aspirin) .... Once Daily 12)  Isosorbide Mononitrate Cr 60 Mg  Tb24 (Isosorbide Mononitrate) .... 1.5 Tabs Once Daily 13)  Toprol Xl 50 Mg Xr24h-Tab (Metoprolol Succinate) .Marland Kitchen.. 1 By Mouth Once Daily 14)  Amlodipine Besylate 5 Mg  Tabs (Amlodipine Besylate) .... Once Daily 15)  Alprazolam 0.25 Mg  Tabs (Alprazolam) .... Two Times A Day 16)  Metformin Hcl 500 Mg  Tabs (Metformin Hcl) .... Two Times A Day 17)  Simvastatin 80 Mg Tabs (Simvastatin) .Marland Kitchen.. 1 By Mouth Qpm 18)  Oxybutynin Chloride 10 Mg  Tb24 (Oxybutynin Chloride) .... Once Daily 19)  Nitroglycerin 0.4 Mg  Subl (Nitroglycerin) .... As Needed 20)  Multivitamins   Tabs (Multiple Vitamin) .... Take 1 Tablet By Mouth Once A Day 21)  Amitriptyline Hcl 25 Mg Tabs (Amitriptyline Hcl) .Marland Kitchen.. 1 By Mouth At Bedtime For Peripheral Neuropathy. May Increase To 2 Tablets If Needed. 22)  Mucus Relief 400 Mg Tabs (Guaifenesin) .... 2 Every 12 Hours As Needed 23)  Sennosides-Docusate Sodium 8.6-50 Mg Tabs (Sennosides-Docusate Sodium) .... Once Daily 24)  Bipap .... At Bedtime 10/7 Cm of Water  With 3l Oxygen 25)  Oxygen .Marland Kitchen.. 3l Via Nasal Cannula  Continuously 26)  Omeprazole 40 Mg Cpdr (Omeprazole) .Marland Kitchen.. 1 By Mouth Q Am  Allergies (verified): 1)  ! * Iv Contrast PMH-FH-SH reviewed-no changes except otherwise noted  Review of Systems       The patient complains of decreased hearing, abdominal pain, severe indigestion/heartburn, and difficulty walking.  The patient denies anorexia, fever, weight loss, weight gain, hoarseness, chest pain, syncope, dyspnea on exertion, peripheral edema, melena, hematochezia, genital sores, muscle weakness, abnormal bleeding, and enlarged lymph nodes.    Physical Exam  General:  morbidly obese white woman in no acute distress.Pale. Head:  Normocephalic and atraumatic without obvious abnormalities. No apparent alopecia or balding. Eyes:  C&S  without icterus Mouth:  no oral lesions Neck:  supple and full ROM.   Lungs:  increased work of breathing (chronic) wiht mild expiratory wheezing Heart:  heart sounds are distant but regular Abdomen:  Massive obese. BS+ x 4. No guarding or rebound. Tender to palpation in general with  most tenderness at the RLQ. Msk:  no joint swelling and no joint warmth.   Pulses:  2+ radial Skin:  pale, no lesions Psych:  memory intact for recent and remote, normally interactive, and good eye contact.     Impression & Recommendations:  Problem # 1:  ABDOMINAL PAIN, UNSPECIFIED SITE (ICD-789.00)  Patient with a history of appendectomy after rupture, ovarian cystectomy bilateral who has a 1 week h/o abdominal pain that is getting worse. No fever, but chills. Exam without peritonitis but diffuse tenderness. Doubt infection. Need to r/o obstruction. May have pain from adhesions.  Plan - lab: CBCD, metabolic, heptaic, amylase, lipase          CT abdomen/;pelvis w/o IV contrast due to dye allergy - for today at 1300 hr at [facility] - patient aware.   Orders: TLB-CBC Platelet - w/Differential (85025-CBCD) TLB-BMP (Basic Metabolic Panel-BMET) (80048-METABOL) TLB-Hepatic/Liver Function Pnl (80076-HEPATIC) TLB-Amylase (82150-AMYL) TLB-Lipase (83690-LIPASE) Radiology Referral (Radiology)  addendum - CT negative except for fatty infiltration of liver                     lab with mildly elevated WBC  Plan - augmentin 875 mg two times a day x 7 days.  Complete Medication List: 1)  Spiriva Handihaler 18 Mcg Caps (Tiotropium bromide monohydrate) .... One puff once daily 2)  Advair Diskus 250-50 Mcg/dose Misc (Fluticasone-salmeterol) .... One puff two times a day 3)  Xopenex 1.25 Mg/32ml Nebu (Levalbuterol hcl) .Marland Kitchen.. 1 vial up to 4 times a day 4)  Proair Hfa 108 (90 Base) Mcg/act Aers (Albuterol sulfate) .... Two puffs puff up to four times per day as needed 5)  Albuterol Sulfate (2.5 Mg/79ml) 0.083%  Nebu (Albuterol sulfate) .... One vial nebulized up to four times per day as needed for cough, wheeze, congestion, or shortness of breath 6)  Glimepiride 2 Mg Tabs (Glimepiride) .Marland Kitchen.. 1 by mouth once daily 7)  Ed K+10 10 Meq Cr-tabs (Potassium chloride) .Marland Kitchen.. 1 by mouth two times a day or 2 tabs once daily 8)  Fluoxetine Hcl 20 Mg Tabs (Fluoxetine hcl) .... Once daily 9)  Plavix 75 Mg Tabs (Clopidogrel bisulfate) .... Once daily 10)  Furosemide 40 Mg Tabs (Furosemide) .... Once daily 11)  Bl Aspirin 325 Mg Tabs (Aspirin) .... Once daily 12)  Isosorbide Mononitrate Cr 60 Mg Tb24 (Isosorbide mononitrate) .... 1.5 tabs once daily 13)  Toprol Xl 50 Mg Xr24h-tab (Metoprolol succinate) .Marland Kitchen.. 1 by mouth once daily 14)  Amlodipine Besylate 5  Mg Tabs (Amlodipine besylate) .... Once daily 15)  Alprazolam 0.25 Mg Tabs (Alprazolam) .... Two times a day 16)  Metformin Hcl 500 Mg Tabs (Metformin hcl) .... Two times a day 17)  Simvastatin 80 Mg Tabs (Simvastatin) .Marland Kitchen.. 1 by mouth qpm 18)  Oxybutynin Chloride 10 Mg Tb24 (Oxybutynin chloride) .... Once daily 19)  Nitroglycerin 0.4 Mg Subl (Nitroglycerin) .... As needed 20)  Multivitamins Tabs (Multiple vitamin) .... Take 1 tablet by mouth once a day 21)  Amitriptyline Hcl 25 Mg Tabs (Amitriptyline hcl) .Marland Kitchen.. 1 by mouth at bedtime for peripheral neuropathy. may increase to 2 tablets if needed. 22)  Mucus Relief 400 Mg Tabs (Guaifenesin) .... 2 every 12 hours as needed 23)  Sennosides-docusate Sodium 8.6-50 Mg Tabs (Sennosides-docusate sodium) .... Once daily 24)  Bipap  .... At bedtime 10/7 cm of water  with 3l oxygen 25)  Oxygen  .Marland Kitchen.. 3l via nasal cannula continuously 26)  Omeprazole 40 Mg Cpdr (Omeprazole) .Marland Kitchen.. 1 by mouth q am 27)  Augmentin 875-125 Mg Tabs (Amoxicillin-pot clavulanate) .Marland Kitchen.. 1 by mouth two times a day x 7 for possible gi infection Prescriptions: AUGMENTIN 875-125 MG TABS (AMOXICILLIN-POT CLAVULANATE) 1 by mouth two times a day x 7 for possible GI  infection  #14 x 0   Entered and Authorized by:   Jacques Navy MD   Signed by:   Jacques Navy MD on 07/27/2010   Method used:   Electronically to        [facility] [address] [phone]* (retail)       [address]       [address]       [place], [place]  [address]       Ph: [phone]       Fax: [phone]   RxID:   [id number]    Orders Added: 1)  TLB-CBC Platelet - w/Differential [85025-CBCD] 2)  TLB-BMP (Basic Metabolic Panel-BMET) [80048-METABOL] 3)  TLB-Hepatic/Liver Function Pnl [80076-HEPATIC] 4)  TLB-Amylase [82150-AMYL] 5)  TLB-Lipase [83690-LIPASE] 6)  Radiology Referral [Radiology] 7)  Est. Patient Level IV [16606]

## 2010-10-06 NOTE — Letter (Signed)
Summary: CMN for Nebul[facility] Med[facility]  CMN for [facility]   Imported By: Sherian Rein 10/28/2009 08:53:50  _____________________________________________________________________  External Attachment:    Type:   Image     Comment:   External Document

## 2010-10-06 NOTE — Assessment & Plan Note (Signed)
Summary: 2 months/ mbw   Copy to:  Andree Coss Primary Provider/Referring Provider:  Illene Regulus  CC:  COPD. OSA. The patient c/o a dry cough and problems with acid reflux. She is doing well on Bipap. Per patients insurance and her Ventolin needs to be changed to [employer].Marland Kitchen  History of Present Illness: 77 yo female with known hx of  COPD, OSA BPAP 10/7 with 3 L O2, and secondary pulmonary hypertension.  Overall, she feels like she has been doing better.  She still gets some wheeze and congestion with clear sputum.  She denies chest pain, fever, or hemoptysis.  She has been using her proair some.  She is more alert, and has been sleeping well with BPAP.  Current Medications (verified): 1)  Spiriva Handihaler 18 Mcg Caps (Tiotropium Bromide Monohydrate) .... One Puff Once Daily 2)  Advair Diskus 250-50 Mcg/dose Misc (Fluticasone-Salmeterol) .... One Puff Two Times A Day 3)  Ventolin Hfa 108 (90 Base) Mcg/act Aers (Albuterol Sulfate) .... Inhale 2 Puffs Every 4 Hours As Needed 4)  Xopenex 1.25 Mg/22ml Nebu (Levalbuterol Hcl) .... One Vial Nebulized Up To Four Times Per Day As Needed 5)  Glimepiride 2 Mg Tabs (Glimepiride) .Marland Kitchen.. 1 By Mouth Once Daily 6)  Ranitidine Hcl 150 Mg  Tabs (Ranitidine Hcl) .... Two Times A Day 7)  Klor-Con M20 20 Meq  Tbcr (Potassium Chloride Crys Cr) .... Once Daily 8)  Fluoxetine Hcl 20 Mg  Tabs (Fluoxetine Hcl) .... Once Daily 9)  Plavix 75 Mg  Tabs (Clopidogrel Bisulfate) .... Once Daily 10)  Furosemide 40 Mg  Tabs (Furosemide) .... Once Daily 11)  Bl Aspirin 325 Mg  Tabs (Aspirin) .... Once Daily 12)  Isosorbide Mononitrate Cr 60 Mg  Tb24 (Isosorbide Mononitrate) .... 1.5 Tabs Once Daily 13)  Metoprolol Tartrate 25 Mg  Tabs (Metoprolol Tartrate) .Marland Kitchen.. 1 Two Times A Day Until Toprol Xl Comes Back 14)  Amlodipine Besylate 5 Mg  Tabs (Amlodipine Besylate) .... Once Daily 15)  Alprazolam 0.25 Mg  Tabs (Alprazolam) .... Two Times A Day 16)  Metformin Hcl 500 Mg  Tabs  (Metformin Hcl) .... Two Times A Day 17)  Crestor 10 Mg  Tabs (Rosuvastatin Calcium) .... Q Pm 18)  Oxybutynin Chloride 10 Mg  Tb24 (Oxybutynin Chloride) .... Once Daily 19)  Nitroglycerin 0.4 Mg  Subl (Nitroglycerin) .... As Needed 20)  Tylenol 325 Mg  Tabs (Acetaminophen) .Marland Kitchen.. 1 or 2 Three Times A Day As Needed 21)  Multivitamins   Tabs (Multiple Vitamin) .... Take 1 Tablet By Mouth Once A Day 22)  Amitriptyline Hcl 25 Mg Tabs (Amitriptyline Hcl) .Marland Kitchen.. 1 By Mouth At Bedtime For Peripheral Neuropathy. May Increase To 2 Tablets If Needed. 23)  Mucus Relief 400 Mg Tabs (Guaifenesin) .... 2 Every 12 Hours As Needed 24)  Sennosides-Docusate Sodium 8.6-50 Mg Tabs (Sennosides-Docusate Sodium) .... Once Daily 25)  Bipap .... At Bedtime 10/7 Cm of Water  With 3l Oxygen 26)  Oxygen .Marland Kitchen.. 3l Via Nasal Cannula Continuously  Allergies (verified): 1)  ! * Iv Contrast  Past History:  Past Medical History: Reviewed history from 08/13/2009 and no changes required. COPD      - PFT 06/19/08 FEV1 1.18(68%), FVC 1.87(75%), FEV1% 63, TLC 3.15(72%), DLCO 43%, +BD OSA      -  PSG 09/27/07 AHI 11, O2 nadir 56%      - BPAP 10/7 cm H2O Hypoxemia       - Room air SaO2 87% from 03/19/09       -  3 liters O2 Secondary pulmonary hypertension Coronary artery disease-LAD, RCA, 50% Left main, Dx. S/p PTCA Diabetes mellitus, type II Hyperlipidemia Hypertension Peripheral vascular disease-s/p iliac stent bilateral, AAA stent tobacco abuse constipation  Past Surgical History: Reviewed history from 06/12/2008 and no changes required. Carotid endarterectomy- left, right and one side red. Hysterectomy AAA stent bilateral iliac stenting left thoracotomy 1966 resection of vocal chord lesions excision serous cyst adenofibroma  Vital Signs:  Patient profile:   77 year old female Height:      61 inches (154.94 cm) Weight:      210.25 pounds (95.57 kg) BMI:     39.87 O2 Sat:      98 % on 3 L/min Temp:     97.9  degrees F (36.61 degrees C) oral Pulse rate:   82 / minute BP sitting:   130 / 80  (left arm) Cuff size:   large  Vitals Entered By: Michel Bickers CMA (October 15, 2009 4:00 PM)  O2 Sat at Rest %:  98 O2 Flow:  3 L/min CC: COPD. OSA. The patient c/o a dry cough and problems with acid reflux. She is doing well on Bipap. Per patients insurance, her Ventolin needs to be changed to [employer].   Physical Exam  General:  on supplemental oxygen and obese.   Nose:  no deformity, discharge, inflammation, or lesions Mouth:  no deformity or lesions, wears dentures Neck:  no JVD.   Lungs:  decreased breath sounds, no wheezing Heart:  regular rhythm, normal rate, and no murmurs.   Abdomen:  obese, soft Extremities:  minimal ankle edema Cervical Nodes:  no significant adenopathy   Impression & Recommendations:  Problem # 1:  COPD (ICD-496)  She is to continue her current inhaler regimen.  Problem # 2:  HYPOXEMIA (ICD-799.02) She is to continue on supplemental oxygen.  Problem # 3:  OBSTRUCTIVE SLEEP APNEA (ICD-327.23) She is doing well with BPAP.  Problem # 4:  PULMONARY HYPERTENSION, SECONDARY (ICD-416.8) Continue to optimize secondary causes of her pulmonary hypertension.  Problem # 5:  PNEUMONIA, ORGANISM UNSPECIFIED (ICD-486) Chest xray today shows improvement, but still some changes.  Will monitor her clinically, and plan on repeating her chest xray at next follow up.  Medications Added to Medication List This Visit: 1)  Proair Hfa 108 (90 Base) Mcg/act Aers (Albuterol sulfate) .... Two puffs puff up to four times per day as needed 2)  Xopenex 1.25 Mg/72ml Nebu (Levalbuterol hcl) .... One vial nebulized up to four times per day  Complete Medication List: 1)  Spiriva Handihaler 18 Mcg Caps (Tiotropium bromide monohydrate) .... One puff once daily 2)  Advair Diskus 250-50 Mcg/dose Misc (Fluticasone-salmeterol) .... One puff two times a day 3)  Proair Hfa 108 (90 Base) Mcg/act Aers  (Albuterol sulfate) .... Two puffs puff up to four times per day as needed 4)  Xopenex 1.25 Mg/20ml Nebu (Levalbuterol hcl) .... One vial nebulized up to four times per day 5)  Glimepiride 2 Mg Tabs (Glimepiride) .Marland Kitchen.. 1 by mouth once daily 6)  Ranitidine Hcl 150 Mg Tabs (Ranitidine hcl) .... Two times a day 7)  Klor-con M20 20 Meq Tbcr (Potassium chloride crys cr) .... Once daily 8)  Fluoxetine Hcl 20 Mg Tabs (Fluoxetine hcl) .... Once daily 9)  Plavix 75 Mg Tabs (Clopidogrel bisulfate) .... Once daily 10)  Furosemide 40 Mg Tabs (Furosemide) .... Once daily 11)  Bl Aspirin 325 Mg Tabs (Aspirin) .... Once daily 12)  Isosorbide Mononitrate Cr 60  Mg Tb24 (Isosorbide mononitrate) .... 1.5 tabs once daily 13)  Metoprolol Tartrate 25 Mg Tabs (Metoprolol tartrate) .Marland Kitchen.. 1 two times a day until toprol xl comes back 14)  Amlodipine Besylate 5 Mg Tabs (Amlodipine besylate) .... Once daily 15)  Alprazolam 0.25 Mg Tabs (Alprazolam) .... Two times a day 16)  Metformin Hcl 500 Mg Tabs (Metformin hcl) .... Two times a day 17)  Crestor 10 Mg Tabs (Rosuvastatin calcium) .... Q pm 18)  Oxybutynin Chloride 10 Mg Tb24 (Oxybutynin chloride) .... Once daily 19)  Nitroglycerin 0.4 Mg Subl (Nitroglycerin) .... As needed 20)  Tylenol 325 Mg Tabs (Acetaminophen) .Marland Kitchen.. 1 or 2 three times a day as needed 21)  Multivitamins Tabs (Multiple vitamin) .... Take 1 tablet by mouth once a day 22)  Amitriptyline Hcl 25 Mg Tabs (Amitriptyline hcl) .Marland Kitchen.. 1 by mouth at bedtime for peripheral neuropathy. may increase to 2 tablets if needed. 23)  Mucus Relief 400 Mg Tabs (Guaifenesin) .... 2 every 12 hours as needed 24)  Sennosides-docusate Sodium 8.6-50 Mg Tabs (Sennosides-docusate sodium) .... Once daily 25)  Bipap  .... At bedtime 10/7 cm of water  with 3l oxygen 26)  Oxygen  .Marland Kitchen.. 3l via nasal cannula continuously  Other Orders: Est. Patient Level III ([address])  Patient Instructions: 1)  Follow up in 4 to 5  months  CXR  Procedure date:  10/15/2009  Findings:      Findings: Improvement in, but not resolution of, left lower lobe opacity is noted.  Moderate enlargement of the cardiomediastinal silhouette is stable.  Peripheral Kerley B lines are noted with probable left pleural thickening but no effusion.  No new focal pulmonary opacity.  Right cervical clips noted.   IMPRESSION: Continued improvement in, but not resolution of, left lower lobe airspace opacity, which may represent scarring versus atelectasis.   Prominent interstitial markings with cardiomegaly suggest interstitial edema but could be chronic.  Prescriptions: XOPENEX 1.25 MG/3ML NEBU (LEVALBUTEROL HCL) one vial nebulized up to four times per day  #120 x 6   Entered and Authorized by:   Coralyn Helling MD   Signed by:   Coralyn Helling MD on 10/15/2009   Method used:   Electronically to        [facility] [address] [phone]* (retail)       [address]       [address]       [place], [place]  [address]       Ph: [phone]       Fax: [phone]   RxID:   [id number] PROAIR HFA 108 (90 BASE) MCG/ACT AERS (ALBUTEROL SULFATE) two puffs puff up to four times per day as needed  #1 x 6   Entered and Authorized by:   Coralyn Helling MD   Signed by:   Coralyn Helling MD on 10/15/2009   Method used:   Electronically to        [facility] [address] [phone]* (retail)       [address]       [address]       [place], [place]  [address]       Ph: [phone]       Fax: [phone]   RxID:   [id number]

## 2010-10-06 NOTE — Letter (Signed)
Summary: [facility]   Imported By: Sherian Rein 07/02/2010 13:34:01  _____________________________________________________________________  External Attachment:    Type:   Image     Comment:   External Document

## 2010-10-06 NOTE — Progress Notes (Signed)
Summary: U/A  Phone Note Call from Patient   Summary of Call: Daughter called and wanted to know if pt should take cipro. NO per MD, U/a is neg. Daughter informed.  Initial call taken by: Lamar Sprinkles, CMA,  July 22, 2010 5:38 PM

## 2010-10-06 NOTE — Assessment & Plan Note (Signed)
Summary: MED CHANGE-PER PH NOTE/AMI AND DR MEN--[facility]   Vital Signs:  Patient profile:   77 year old female Height:      61 inches (154.94 cm) Weight:      214.50 pounds (97.50 kg) BMI:     40.68 O2 Sat:      94 % on 3 L/min Temp:     98.4 degrees F (36.89 degrees C) oral Pulse rate:   82 / minute Pulse rhythm:   regular BP sitting:   140 / 62  (left arm) Cuff size:   large  Vitals Entered By: Brenton Grills (December 24, 2009 4:38 PM)  O2 Flow:  3 L/min CC: pt here to discuss med changes to lower rx cost/also asked about ordering Diastop? /aj   Primary Care Provider:  Illene Regulus  CC:  pt here to discuss med changes to lower rx cost/also asked about ordering Diastop? /aj.  History of Present Illness: Patient with recent medication review by Pharm D at [facility]. Several suggestions reviewed with her: Crestor to simvastatin; metoprolol tartrate to metoprolol succinate ( pharmacist understated dose change); H2 block to PPI.  Discussed all changes in detail. Agreed with all changes except she needs to contact her cardiologist before changing to a PPI since she is on Plavix for coronary stents.  Current Medications (verified): 1)  Spiriva Handihaler 18 Mcg Caps (Tiotropium Bromide Monohydrate) .... One Puff Once Daily 2)  Advair Diskus 250-50 Mcg/dose Misc (Fluticasone-Salmeterol) .... One Puff Two Times A Day 3)  Proair Hfa 108 (90 Base) Mcg/act Aers (Albuterol Sulfate) .... Two Puffs Puff Up To Four Times Per Day As Needed 4)  Albuterol Sulfate (2.5 Mg/29ml) 0.083% Nebu (Albuterol Sulfate) .... One Vial Nebulized Up To Four Times Per Day As Needed For Cough, Wheeze, Congestion, or Shortness of Breath 5)  Glimepiride 2 Mg Tabs (Glimepiride) .Marland Kitchen.. 1 By Mouth Once Daily 6)  Ranitidine Hcl 150 Mg  Tabs (Ranitidine Hcl) .... Two Times A Day 7)  Klor-Con M20 20 Meq  Tbcr (Potassium Chloride Crys Cr) .... Once Daily 8)  Fluoxetine Hcl 20 Mg  Tabs (Fluoxetine Hcl) .... Once Daily 9)  Plavix  75 Mg  Tabs (Clopidogrel Bisulfate) .... Once Daily 10)  Furosemide 40 Mg  Tabs (Furosemide) .... Once Daily 11)  Bl Aspirin 325 Mg  Tabs (Aspirin) .... Once Daily 12)  Isosorbide Mononitrate Cr 60 Mg  Tb24 (Isosorbide Mononitrate) .... 1.5 Tabs Once Daily 13)  Metoprolol Tartrate 25 Mg  Tabs (Metoprolol Tartrate) .Marland Kitchen.. 1 Two Times A Day Until Toprol Xl Comes Back 14)  Amlodipine Besylate 5 Mg  Tabs (Amlodipine Besylate) .... Once Daily 15)  Alprazolam 0.25 Mg  Tabs (Alprazolam) .... Two Times A Day 16)  Metformin Hcl 500 Mg  Tabs (Metformin Hcl) .... Two Times A Day 17)  Crestor 10 Mg  Tabs (Rosuvastatin Calcium) .... Q Pm 18)  Oxybutynin Chloride 10 Mg  Tb24 (Oxybutynin Chloride) .... Once Daily 19)  Nitroglycerin 0.4 Mg  Subl (Nitroglycerin) .... As Needed 20)  Tylenol 325 Mg  Tabs (Acetaminophen) .Marland Kitchen.. 1 or 2 Three Times A Day As Needed 21)  Multivitamins   Tabs (Multiple Vitamin) .... Take 1 Tablet By Mouth Once A Day 22)  Amitriptyline Hcl 25 Mg Tabs (Amitriptyline Hcl) .Marland Kitchen.. 1 By Mouth At Bedtime For Peripheral Neuropathy. May Increase To 2 Tablets If Needed. 23)  Mucus Relief 400 Mg Tabs (Guaifenesin) .... 2 Every 12 Hours As Needed 24)  Sennosides-Docusate Sodium 8.6-50 Mg Tabs (Sennosides-Docusate  Sodium) .... Once Daily 25)  Bipap .... At Bedtime 10/7 Cm of Water  With 3l Oxygen 26)  Oxygen .Marland Kitchen.. 3l Via Nasal Cannula Continuously  Allergies (verified): 1)  ! * Iv Contrast  Past History:  Past Medical History: Last updated: 08/13/2009 COPD      - PFT 06/19/08 FEV1 1.18(68%), FVC 1.87(75%), FEV1% 63, TLC 3.15(72%), DLCO 43%, +BD OSA      -  PSG 09/27/07 AHI 11, O2 nadir 56%      - BPAP 10/7 cm H2O Hypoxemia       - Room air SaO2 87% from 03/19/09       - 3 liters O2 Secondary pulmonary hypertension Coronary artery disease-LAD, RCA, 50% Left main, Dx. S/p PTCA Diabetes mellitus, type II Hyperlipidemia Hypertension Peripheral vascular disease-s/p iliac stent bilateral, AAA  stent tobacco abuse constipation  Past Surgical History: Last updated: 06/12/2008 Carotid endarterectomy- left, right and one side red. Hysterectomy AAA stent bilateral iliac stenting left thoracotomy 1966 resection of vocal chord lesions excision serous cyst adenofibroma  Family History: Last updated: 06/12/2008 non-contributory  Social History: Last updated: 07/03/2009 widowed.  Risk Factors: Caffeine Use: 0 (07/04/2007) Exercise: no (07/04/2007)  Risk Factors: Smoking Status: quit (07/04/2007) Passive Smoke Exposure: no (07/04/2007)  Review of Systems       The patient complains of severe indigestion/heartburn, muscle weakness, and difficulty walking.  The patient denies anorexia, weight loss, hoarseness, chest pain, syncope, dyspnea on exertion, prolonged cough, and enlarged lymph nodes.    Physical Exam  General:  Chronically ill appearing elderly woman on oxygen using a walker. Head:  normocephalic and atraumatic.   Eyes:  corneas and lenses clear and no injection.   Neck:  supple.   Lungs:  no intercostal retractions, no accessory muscle use, normal breath sounds, no crackles, and no wheezes.   Heart:  rate controlled Neurologic:  alert & oriented X3 and cranial nerves II-XII intact.  Hard of hearing Skin:  turgor normal, color normal, no suspicious lesions, and no ulcerations.   Psych:  Oriented X3, normally interactive, and good eye contact.  Depends on daughter to help understand meds and changes.   Impression & Recommendations:  Problem # 1:  HYPERLIPIDEMIA (ICD-272.4) OK to make change in medications.  Her updated medication list for this problem includes:    Simvastatin 80 Mg Tabs (Simvastatin) .Marland Kitchen... 1 by mouth qpm  Problem # 2:  CORONARY ARTERY DISEASE (ICD-414.00) OK to change to long acting toprolol.  Her updated medication list for this problem includes:    Plavix 75 Mg Tabs (Clopidogrel bisulfate) ..... Once daily    Furosemide 40 Mg Tabs  (Furosemide) ..... Once daily    Bl Aspirin 325 Mg Tabs (Aspirin) ..... Once daily    Isosorbide Mononitrate Cr 60 Mg Tb24 (Isosorbide mononitrate) .Marland Kitchen... 1.5 tabs once daily    Toprol Xl 50 Mg Xr24h-tab (Metoprolol succinate) .Marland Kitchen... 1 by mouth once daily    Amlodipine Besylate 5 Mg Tabs (Amlodipine besylate) ..... Once daily    Nitroglycerin 0.4 Mg Subl (Nitroglycerin) .Marland Kitchen... As needed  Problem # 3:  DYSPEPSIA, CHRONIC (ICD-536.8) No change in medication until she gets permission from cardiology to change to PPI  Problem # 4:  mediation review spent greater than 50% of a 30 minute visit in education and counseling in regard to medications.   Complete Medication List: 1)  Spiriva Handihaler 18 Mcg Caps (Tiotropium bromide monohydrate) .... One puff once daily 2)  Advair Diskus 250-50 Mcg/dose Misc (Fluticasone-salmeterol) .Marland KitchenMarland KitchenMarland Kitchen  One puff two times a day 3)  Proair Hfa 108 (90 Base) Mcg/act Aers (Albuterol sulfate) .... Two puffs puff up to four times per day as needed 4)  Albuterol Sulfate (2.5 Mg/86ml) 0.083% Nebu (Albuterol sulfate) .... One vial nebulized up to four times per day as needed for cough, wheeze, congestion, or shortness of breath 5)  Glimepiride 2 Mg Tabs (Glimepiride) .Marland Kitchen.. 1 by mouth once daily 6)  Ranitidine Hcl 150 Mg Tabs (Ranitidine hcl) .... Two times a day 7)  Ed K+10 10 Meq Cr-tabs (Potassium chloride) .Marland Kitchen.. 1 by mouth two times a day or 2 tabs once daily 8)  Fluoxetine Hcl 20 Mg Tabs (Fluoxetine hcl) .... Once daily 9)  Plavix 75 Mg Tabs (Clopidogrel bisulfate) .... Once daily 10)  Furosemide 40 Mg Tabs (Furosemide) .... Once daily 11)  Bl Aspirin 325 Mg Tabs (Aspirin) .... Once daily 12)  Isosorbide Mononitrate Cr 60 Mg Tb24 (Isosorbide mononitrate) .... 1.5 tabs once daily 13)  Toprol Xl 50 Mg Xr24h-tab (Metoprolol succinate) .Marland Kitchen.. 1 by mouth once daily 14)  Amlodipine Besylate 5 Mg Tabs (Amlodipine besylate) .... Once daily 15)  Alprazolam 0.25 Mg Tabs (Alprazolam)  .... Two times a day 16)  Metformin Hcl 500 Mg Tabs (Metformin hcl) .... Two times a day 17)  Simvastatin 80 Mg Tabs (Simvastatin) .Marland Kitchen.. 1 by mouth qpm 18)  Oxybutynin Chloride 10 Mg Tb24 (Oxybutynin chloride) .... Once daily 19)  Nitroglycerin 0.4 Mg Subl (Nitroglycerin) .... As needed 20)  Tylenol 325 Mg Tabs (Acetaminophen) .Marland Kitchen.. 1 or 2 three times a day as needed 21)  Multivitamins Tabs (Multiple vitamin) .... Take 1 tablet by mouth once a day 22)  Amitriptyline Hcl 25 Mg Tabs (Amitriptyline hcl) .Marland Kitchen.. 1 by mouth at bedtime for peripheral neuropathy. may increase to 2 tablets if needed. 23)  Mucus Relief 400 Mg Tabs (Guaifenesin) .... 2 every 12 hours as needed 24)  Sennosides-docusate Sodium 8.6-50 Mg Tabs (Sennosides-docusate sodium) .... Once daily 25)  Bipap  .... At bedtime 10/7 cm of water  with 3l oxygen 26)  Oxygen  .Marland Kitchen.. 3l via nasal cannula continuously  Patient Instructions: 1)  Medication changes as Recommended by Orland Mustard PharmD: ok to change to simvastatin. Will need follow-up lab in 1 month. OK to change potassium from to two tablets daily. OK to change metoprolol tartrate 25mg  two times a day to metoprolol succinate 50mg  once a day. You need to check with Dr. Allyson Sabal about using omeprazole while on plavix for coronary stents! Prescriptions: ED K+10 10 MEQ CR-TABS (POTASSIUM CHLORIDE) 1 by mouth two times a day or 2 tabs once daily  #60 x 12   Entered and Authorized by:   Jacques Navy MD   Signed by:   Jacques Navy MD on 12/26/2009   Method used:   Electronically to        [facility] [address] [phone]* (retail)       [address]       [address]       [place], [place]  [address]       Ph: [phone]       Fax: [phone]   RxID:   [id number] SIMVASTATIN 80 MG TABS (SIMVASTATIN) 1 by mouth qPM  #30 x 12   Entered and Authorized by:   Jacques Navy MD   Signed by:   Jacques Navy MD on 12/26/2009   Method used:   Electronically to  [facility] [address] [phone]* (retail)       [address]       [address]       [place], [place]  [address]       Ph: [phone]       Fax: [phone]   RxID:   [id number] TOPROL XL 50 MG XR24H-TAB (METOPROLOL SUCCINATE) 1 by mouth once daily  #30 x 12   Entered and Authorized by:   Jacques Navy MD   Signed by:   Jacques Navy MD on 12/26/2009   Method used:   Electronically to        [facility] [address] [phone]* (retail)       [address]       [address]       [place], [place]  [address]       Ph: [phone]       Fax: [phone]   RxID:   [id number]

## 2010-10-06 NOTE — Miscellaneous (Signed)
Summary: Nebulizer change to albuterol  Medications Added ALBUTEROL SULFATE (2.5 MG/3ML) 0.083% NEBU (ALBUTEROL SULFATE) one vial nebulized up to four times per day as needed for cough, wheeze, congestion, or shortness of breath       Clinical Lists Changes Received request from pt's pharmacy to change xopenex to albuterol for cost savings.  Will send prescription, and have my nurse notify pt of change. Medications: Changed medication from XOPENEX 1.25 MG/3ML NEBU (LEVALBUTEROL HCL) one vial nebulized up to four times per day to ALBUTEROL SULFATE (2.5 MG/3ML) 0.083% NEBU (ALBUTEROL SULFATE) one vial nebulized up to four times per day as needed for cough, wheeze, congestion, or shortness of breath - Signed Rx of ALBUTEROL SULFATE (2.5 MG/3ML) 0.083% NEBU (ALBUTEROL SULFATE) one vial nebulized up to four times per day as needed for cough, wheeze, congestion, or shortness of breath;  #120 x 6;  Signed;  Entered by: Coralyn Helling MD;  Authorized by: Coralyn Helling MD;  Method used: Electronically to [facility] [address] [phone]*, [address], [place], [place]  [address], Ph: [phone], Fax: [phone]    Prescriptions: ALBUTEROL SULFATE (2.5 MG/3ML) 0.083% NEBU (ALBUTEROL SULFATE) one vial nebulized up to four times per day as needed for cough, wheeze, congestion, or shortness of breath  #120 x 6   Entered and Authorized by:   Coralyn Helling MD   Signed by:   Coralyn Helling MD on 12/21/2009   Method used:   Electronically to        [facility] [address] [phone]* (retail)       [address]       [address]       [place], [place]  [address]       Ph: [phone]       Fax: [phone]   RxID:   [id number]   Appended Document: Nebulizer change to albuterol The pt is aware of the change from Xopenex Nebs to Albuterol Nebs. She said she still has plenty of Xopenex on hand and I told her it would be fine to finish using the Xopenex up and then switch to the Albuterol. The pt had  verbal understanding.

## 2010-10-06 NOTE — Miscellaneous (Signed)
Summary: Auto-BPAP download 03/20/09 to 04/20/09   Clinical Lists Changes Used on 18 of 32 days with average 4hrs 23 min.  Optimal pressure 10/7 cm H2O with average AHI 3.7.  Will set pressure at 10/7.  Will have my nurse call to inform pt that BPAP is working, and that patient needs to try and use machine every night. Orders: Added new Referral order of DME Referral (DME) - Signed  Appended Document: Auto-BPAP download 03/20/09 to 04/20/09 Pt is aware of results and that she needs to wear her Bipap every night.   Pt verbalized her understanding and stated that the test was not accurate because she had been in the hospital for 10 of the 32 days during testing.

## 2010-10-06 NOTE — Letter (Signed)
[facility] [address] [place], [place]  [address] Phone: [phone]      March 28, 2010   Pendleton Najjar [address]a [address] [place], [place] [address]  RE:  LAB RESULTS  Dear  Ms. Canedo,  The following is an interpretation of your most recent lab tests.  Please take note of any instructions provided or changes to medications that have resulted from your lab work.  LIVER FUNCTION TESTS:  Good - no changes needed  LIPID PANEL:  Good - no changes needed Triglyceride: 190.0   Cholesterol: 138   LDL: 60   HDL: 39.60   Chol/HDL%:  3    Great control of cholesterol. copy sent to Dr. Alanda Amass   Sincerely Yours,    Jacques Navy MD  Patient: [facility] Note: All result statuses are Final unless otherwise noted.  Tests: (1) Lipid Panel (LIPID)   Cholesterol               138 mg/dL                   2-956     ATP III Classification            Desirable:  < 200 mg/dL                    Borderline High:  200 - 239 mg/dL               High:  > = 240 mg/dL   Triglycerides        [H]  190.0 mg/dL                 2.1-308.6     Normal:  <150 mg/dL     Borderline High:  578 - 199 mg/dL   HDL                       46.96 mg/dL                 >29.52   VLDL Cholesterol          38.0 mg/dL                  8.4-13.2   LDL Cholesterol           60 mg/dL                    4-40  CHO/HDL Ratio:  CHD Risk                             3                    Men          Women     1/2 Average Risk     3.4          3.3     Average Risk          5.0          4.4     2X Average Risk          9.6          7.1     3X Average Risk          15.0          11.0  Tests: (2) Hepatic/Liver Function Panel (HEPATIC)   Total Bilirubin           0.3 mg/dL                   1.6-1.0   Direct Bilirubin          0.1 mg/dL                   9.6-0.4   Alkaline Phosphatase      75 U/L                      39-117   AST                       31 U/L                       0-37   ALT                       31 U/L                      0-35   Total Protein             6.7 g/dL                    5.4-0.9   Albumin              [L]  3.3 g/dL                    8.1-1.9

## 2010-10-06 NOTE — Progress Notes (Signed)
Summary: talk to nurse  Phone Note Call from Patient Call back at [phone]   Caller: Daughter Call For: sood Summary of Call: have questions about oxygen if power goes out Initial call taken by: Rickard Patience,  September 15, 2009 9:26 AM  Follow-up for Phone Call        Pt daughter wanted to know if her mothers power went out would it be safe for her to come stay with her eventhough she ahs a firwe place. I advised as long as the patient and the oxygen stays at least 8 feet away they would be fine. Sh ealso wanted to know how the tank she has to refill her samller tanks will work if the power goes out. iadvised I was not sure of this and that she needed to call [facility] to ask them what to do about this. Pt daughter stated she already had a call into [facility] and was awaiting a call back. Carron Curie CMA  September 15, 2009 9:35 AM

## 2010-10-06 NOTE — Progress Notes (Signed)
Summary: return call  Phone Note Call from Patient   Caller: Wanda Plump [phone]/ [phone] Summary of Call: Susie called on behalf of the pt (hard of hearing). SHe is requesting a call back regarding messg from last week about a rx.Alvy Beal Archie CMA  August 03, 2010 1:44 PM   Follow-up for Phone Call        1.Should patient take plavix and cilostazol together? (pt has not started yet) 2.Insert for cilostazol has warning if pt is on omeprazole or antidepressants - pt is on both 3. Pt completed antibiotics, abd pain is better. She now has loose dk green/black/brown stools daily that look like coffie grounds every time she eats.  Follow-up by: Lamar Sprinkles, CMA,  August 04, 2010 12:19 PM  Additional Follow-up for Phone Call Additional follow up Details #1::        MD advised office visit for stool check - pt wants to wait until thursday am. Advised to call office w/any new or concerning symptoms - she agreed.   Please review question 1 and 2 THANK YOU! Additional Follow-up by: Lamar Sprinkles, CMA,  August 04, 2010 1:02 PM    Additional Follow-up for Phone Call Additional follow up Details #2::    re 1 and 2: yes there is a problem with plavix. Will substitute pentoxifylline (trental) 400mg  three times a day with meals to help with claudication, # 90.   alternatively she can see her cardiologist to see if they agree with the diagnosis and/or order a diagnostic procedure to confirm the problem. Follow-up by: Jacques Navy MD,  August 04, 2010 5:22 PM  Additional Follow-up for Phone Call Additional follow up Details #3:: Details for Additional Follow-up Action Taken: Daughter informed, rx sent in, EMR UPDATED. Additional Follow-up by: Lamar Sprinkles, CMA,  August 05, 2010 12:20 PM  New/Updated Medications: PENTOXIFYLLINE CR 400 MG CR-TABS (PENTOXIFYLLINE)  PENTOXIFYLLINE CR 400 MG CR-TABS (PENTOXIFYLLINE) 1 three times a day Prescriptions: PENTOXIFYLLINE CR 400 MG  CR-TABS (PENTOXIFYLLINE) 1 three times a day  #90 x 1   Entered by:   Lamar Sprinkles, CMA   Authorized by:   Jacques Navy MD   Signed by:   Lamar Sprinkles, CMA on 08/05/2010   Method used:   Electronically to        [facility] [address] [phone]* (retail)       [address]       [address]       [place], [place]  [address]       Ph: [phone]       Fax: [phone]   RxID:   [id number]

## 2010-10-06 NOTE — Letter (Signed)
Summary: CMN for Albuterol / [facility]   CMN for Albuterol / [facility]   Imported By: Lennie Odor 01/06/2010 16:28:05  _____________________________________________________________________  External Attachment:    Type:   Image     Comment:   External Document

## 2010-10-06 NOTE — Letter (Signed)
Summary: CMN for Nebulizer & Meds/[facility]  CMN for Nebulizer & Meds/[facility]   Imported By: Sherian Rein 01/12/2010 15:08:58  _____________________________________________________________________  External Attachment:    Type:   Image     Comment:   External Document

## 2010-10-06 NOTE — Progress Notes (Signed)
  Phone Note Other Incoming   Caller: Fax from [facility] [address] of Call: Pt went in for medication therapy management sesion at [facility] with PharmD Orland Mustard he reviewed her medication profile and discussed with her indication, dosing, side effects and expectations for each drug. She was interested in making the following changes to her medication therapy.                1. change crestor 10mg  qday to simvastatin 80mg  qday (less expensive)                2. Change metoprolol tartrate 25mg  two times a day to metoprolol succinate 25mg  qday (decresed pill burden)                3. change ranitidine 150mg  two times a day to omeprazole 20mg  qday (GERD inadequately controlled)                4. change KCI qday to KCI 2 caps qday (difficulty swallowing 20 mEq tablet) Please call pharmacey with approval or denial contacts [phone] Initial call taken by: Ami Bullins CMA,  December 18, 2009 9:50 AM  Follow-up for Phone Call        happy to see patient to review meds and recommendations for change Follow-up by: Jacques Navy MD,  December 18, 2009 1:06 PM  Additional Follow-up for Phone Call Additional follow up Details #1::        spoke with pt and advised her to make an appt with md to go over medication changes. Pt will have daughter to call up here and sch appt Additional Follow-up by: Ami Bullins CMA,  December 18, 2009 1:44 PM

## 2010-10-06 NOTE — Miscellaneous (Signed)
Summary: Orders Update  Clinical Lists Changes  Orders: Added new Test order of T-2 View CXR (71020TC) - Signed 

## 2010-10-06 NOTE — Medication Information (Signed)
Summary: [facility]  [facility]   Imported By: Sherian Rein 10/17/2009 14:48:48  _____________________________________________________________________  External Attachment:    Type:   Image     Comment:   External Document

## 2010-10-06 NOTE — Progress Notes (Signed)
Summary: REFILL  Phone Note Refill Request   Refills Requested: Medication #1:  ALPRAZOLAM 0.25 MG  TABS two times a day Initial call taken by: Lamar Sprinkles, CMA,  Cay 14, 2011 12:17 PM  Follow-up for Phone Call        ok x 5 Follow-up by: Jacques Navy MD,  Ahria 14, 2011 1:21 PM    Prescriptions: ALPRAZOLAM 0.25 MG  TABS (ALPRAZOLAM) two times a day  #62 x 5   Entered by:   Ami Bullins CMA   Authorized by:   Jacques Navy MD   Signed by:   Bill Salinas CMA on 02/17/2010   Method used:   Telephoned to ...       [facility] [address] [phone]* (retail)       [address]       [address]       [place], [place]  [address]       Ph: [phone]       Fax: [phone]   RxID:   [id number]

## 2010-10-08 NOTE — Progress Notes (Signed)
  Phone Note Refill Request Message from:  Fax from Pharmacy on August 18, 2010 3:38 PM  Refills Requested: Medication #1:  ALPRAZOLAM 0.25 MG  TABS two times a day Please Advise refills  Initial call taken by: Ami Bullins CMA,  August 18, 2010 3:38 PM  Follow-up for Phone Call        ok for refill x 5 Follow-up by: Jacques Navy MD,  August 18, 2010 5:24 PM    Prescriptions: ALPRAZOLAM 0.25 MG  TABS (ALPRAZOLAM) two times a day  #62 x 5   Entered by:   Ami Bullins CMA   Authorized by:   Jacques Navy MD   Signed by:   Bill Salinas CMA on 08/19/2010   Method used:   Telephoned to ...       [facility] [address] [phone]* (retail)       [address]       [address]       [place], [place]  [address]       Ph: [phone]       Fax: [phone]   RxID:   [id number]

## 2010-10-08 NOTE — Progress Notes (Signed)
Summary: REQ FOR RX  Phone Note Call from Patient   Caller: Lupita Leash - daughter [phone] Summary of Call: Pt's daughter is req rx for walker w/wheels.  Initial call taken by: Lamar Sprinkles, CMA,  August 27, 2010 4:34 PM  Follow-up for Phone Call        rx written - at triage cart Follow-up by: Jacques Navy MD,  August 27, 2010 5:15 PM  Additional Follow-up for Phone Call Additional follow up Details #1::        Daughter informed Additional Follow-up by: Lamar Sprinkles, CMA,  August 27, 2010 5:19 PM

## 2010-10-08 NOTE — Assessment & Plan Note (Signed)
Summary: PER DAUGHTER CALL/CB   Visit Type:  Follow-up Copy to:  Andree Coss Primary Provider/Referring Provider:  Illene Regulus  CC:  COPD...OSA...pt doing well on bipap...pt c/o sinus pain...cough w/ clear sputum...did have one episode w/ blood mixed in sputum.  History of Present Illness: 77 yo female with known hx of  COPD, OSA BPAP 10/7 with 3 L O2, and secondary pulmonary hypertension.  She had a recent episode of left ear pain associated with sinus congestion.  She also had post-nasal drip.  She had some nose bleed and coughed up some phlegm with blood in it.  She was not having fever.  This only happened once.  She has occasional cough, but not much wheeze.  She gets around for her daily activities okay.  She is using her nebulizer 2 to 3 times per day.  She loves using her BPAP.    She was recently treated for diarrhea, but this has resolved.   Current Medications (verified): 1)  Spiriva Handihaler 18 Mcg Caps (Tiotropium Bromide Monohydrate) .... One Puff Once Daily 2)  Advair Diskus 250-50 Mcg/dose Misc (Fluticasone-Salmeterol) .... One Puff Two Times A Day 3)  Xopenex 1.25 Mg/32ml Nebu (Levalbuterol Hcl) .Marland Kitchen.. 1 Vial Up To 4 Times A Day 4)  Proair Hfa 108 (90 Base) Mcg/act Aers (Albuterol Sulfate) .... Two Puffs Puff Up To Four Times Per Day As Needed 5)  Albuterol Sulfate (2.5 Mg/23ml) 0.083% Nebu (Albuterol Sulfate) .... One Vial Nebulized Up To Four Times Per Day As Needed For Cough, Wheeze, Congestion, or Shortness of Breath 6)  Glimepiride 2 Mg Tabs (Glimepiride) .Marland Kitchen.. 1 By Mouth Once Daily 7)  Ed K+10 10 Meq Cr-Tabs (Potassium Chloride) .Marland Kitchen.. 1 By Mouth Two Times A Day or 2 Tabs Once Daily 8)  Fluoxetine Hcl 20 Mg  Tabs (Fluoxetine Hcl) .... Once Daily 9)  Plavix 75 Mg  Tabs (Clopidogrel Bisulfate) .... Once Daily 10)  Furosemide 40 Mg  Tabs (Furosemide) .... Once Daily 11)  Bl Aspirin 325 Mg  Tabs (Aspirin) .... Once Daily 12)  Isosorbide Mononitrate Cr 60 Mg  Tb24  (Isosorbide Mononitrate) .... 1.5 Tabs Once Daily 13)  Toprol Xl 50 Mg Xr24h-Tab (Metoprolol Succinate) .Marland Kitchen.. 1 By Mouth Once Daily 14)  Amlodipine Besylate 5 Mg  Tabs (Amlodipine Besylate) .... Once Daily 15)  Alprazolam 0.25 Mg  Tabs (Alprazolam) .... Two Times A Day 16)  Metformin Hcl 500 Mg  Tabs (Metformin Hcl) .... Two Times A Day 17)  Simvastatin 80 Mg Tabs (Simvastatin) .Marland Kitchen.. 1 By Mouth Qpm 18)  Oxybutynin Chloride 10 Mg  Tb24 (Oxybutynin Chloride) .... Once Daily 19)  Nitroglycerin 0.4 Mg  Subl (Nitroglycerin) .... As Needed 20)  Multivitamins   Tabs (Multiple Vitamin) .... Take 1 Tablet By Mouth Once A Day 21)  Amitriptyline Hcl 25 Mg Tabs (Amitriptyline Hcl) .Marland Kitchen.. 1 By Mouth At Bedtime For Peripheral Neuropathy. May Increase To 2 Tablets If Needed. 22)  Mucus Relief 400 Mg Tabs (Guaifenesin) .... 2 Every 12 Hours As Needed 23)  Sennosides-Docusate Sodium 8.6-50 Mg Tabs (Sennosides-Docusate Sodium) .... Once Daily 24)  Bipap .... At Bedtime 10/7 Cm of Water  With 3l Oxygen 25)  Oxygen .Marland Kitchen.. 3l Via Nasal Cannula Continuously 26)  Omeprazole 40 Mg Cpdr (Omeprazole) .Marland Kitchen.. 1 By Mouth Q Am 27)  Pentoxifylline Cr 400 Mg Cr-Tabs (Pentoxifylline) .Marland Kitchen.. 1 Three Times A Day 28)  Calcium 600+d 600-400 Mg-Unit Tabs (Calcium Carbonate-Vitamin D) .... Once Daily  Allergies (verified): 1)  ! * Iv  Contrast  Past History:  Past Medical History: Reviewed history from 03/24/2010 and no changes required. COPD      - PFT 06/19/08 FEV1 1.18(68%), FEV1% 63, TLC 3.15(72%), DLCO 43%, +BD OSA      -  PSG 09/27/07 AHI 11, O2 nadir 56%      - BPAP 10/7 cm H2O Hypoxemia       - Room air SaO2 87% from 03/19/09       - 3 liters O2 Secondary pulmonary hypertension Coronary artery disease-LAD, RCA, 50% Left main, Dx. S/p PTCA Diabetes mellitus, type II Hyperlipidemia Hypertension Peripheral vascular disease-s/p iliac stent bilateral, AAA stent tobacco abuse constipation  Past Surgical History: Reviewed  history from 06/12/2008 and no changes required. Carotid endarterectomy- left, right and one side red. Hysterectomy AAA stent bilateral iliac stenting left thoracotomy 1966 resection of vocal chord lesions excision serous cyst adenofibroma  Vital Signs:  Patient profile:   77 year old female Height:      62 inches (157.48 cm) Weight:      208.50 pounds (94.77 kg) BMI:     38.27 O2 Sat:      95 % on 3 L/min pulsing Temp:     97.7 degrees F (36.50 degrees C) oral Pulse rate:   75 / minute BP sitting:   120 / 78  (left arm) Cuff size:   large  Vitals Entered By: Michel Bickers CMA (August 18, 2010 9:08 AM)  O2 Sat at Rest %:  95 O2 Flow:  3 L/min pulsing CC: COPD...OSA...pt doing well on bipap...pt c/o sinus pain...cough w/ clear sputum...did have one episode w/ blood mixed in sputum Comments Medications reviewed with patient Michel Bickers [facility]  August 18, 2010 9:09 AM   Physical Exam  General:  on supplemental oxygen and obese.   Nose:  no deformity, discharge, inflammation, or lesions Mouth:  no deformity or lesions, wears dentures Neck:  no JVD.   Lungs:  decreased breath sounds, no wheezing Heart:  regular rhythm, normal rate, and no murmurs.   Extremities:  minimal ankle edema Cervical Nodes:  no significant adenopathy Psych:  alert and cooperative; normal mood and affect; normal attention span and concentration   Impression & Recommendations:  Problem # 1:  COPD (ICD-496)  She is to continue on her current inhaler regimen.  She will complete her current prescription for xopenex and then change to albuterol in her nebulizer.  Problem # 2:  OBSTRUCTIVE SLEEP APNEA (ICD-327.23)  She is doing well with BPAP.    Problem # 3:  PULMONARY HYPERTENSION, SECONDARY (ICD-416.8)  Continue to optimize secondary causes of her pulmonary hypertension.  Medications Added to Medication List This Visit: 1)  Calcium 600+d 600-400 Mg-unit Tabs (Calcium carbonate-vitamin d) ....  Once daily  Complete Medication List: 1)  Spiriva Handihaler 18 Mcg Caps (Tiotropium bromide monohydrate) .... One puff once daily 2)  Advair Diskus 250-50 Mcg/dose Misc (Fluticasone-salmeterol) .... One puff two times a day 3)  Xopenex 1.25 Mg/52ml Nebu (Levalbuterol hcl) .Marland Kitchen.. 1 vial up to 4 times a day 4)  Proair Hfa 108 (90 Base) Mcg/act Aers (Albuterol sulfate) .... Two puffs puff up to four times per day as needed 5)  Albuterol Sulfate (2.5 Mg/38ml) 0.083% Nebu (Albuterol sulfate) .... One vial nebulized up to four times per day as needed for cough, wheeze, congestion, or shortness of breath 6)  Glimepiride 2 Mg Tabs (Glimepiride) .Marland Kitchen.. 1 by mouth once daily 7)  Ed K+10 10 Meq Cr-tabs (Potassium chloride) .Marland KitchenMarland KitchenMarland Kitchen  1 by mouth two times a day or 2 tabs once daily 8)  Fluoxetine Hcl 20 Mg Tabs (Fluoxetine hcl) .... Once daily 9)  Plavix 75 Mg Tabs (Clopidogrel bisulfate) .... Once daily 10)  Furosemide 40 Mg Tabs (Furosemide) .... Once daily 11)  Bl Aspirin 325 Mg Tabs (Aspirin) .... Once daily 12)  Isosorbide Mononitrate Cr 60 Mg Tb24 (Isosorbide mononitrate) .... 1.5 tabs once daily 13)  Toprol Xl 50 Mg Xr24h-tab (Metoprolol succinate) .Marland Kitchen.. 1 by mouth once daily 14)  Amlodipine Besylate 5 Mg Tabs (Amlodipine besylate) .... Once daily 15)  Alprazolam 0.25 Mg Tabs (Alprazolam) .... Two times a day 16)  Metformin Hcl 500 Mg Tabs (Metformin hcl) .... Two times a day 17)  Simvastatin 80 Mg Tabs (Simvastatin) .Marland Kitchen.. 1 by mouth qpm 18)  Oxybutynin Chloride 10 Mg Tb24 (Oxybutynin chloride) .... Once daily 19)  Nitroglycerin 0.4 Mg Subl (Nitroglycerin) .... As needed 20)  Multivitamins Tabs (Multiple vitamin) .... Take 1 tablet by mouth once a day 21)  Amitriptyline Hcl 25 Mg Tabs (Amitriptyline hcl) .Marland Kitchen.. 1 by mouth at bedtime for peripheral neuropathy. may increase to 2 tablets if needed. 22)  Mucus Relief 400 Mg Tabs (Guaifenesin) .... 2 every 12 hours as needed 23)  Sennosides-docusate Sodium 8.6-50 Mg  Tabs (Sennosides-docusate sodium) .... Once daily 24)  Bipap  .... At bedtime 10/7 cm of water  with 3l oxygen 25)  Oxygen  .Marland Kitchen.. 3l via nasal cannula continuously 26)  Omeprazole 40 Mg Cpdr (Omeprazole) .Marland Kitchen.. 1 by mouth q am 27)  Pentoxifylline Cr 400 Mg Cr-tabs (Pentoxifylline) .Marland Kitchen.. 1 three times a day 28)  Calcium 600+d 600-400 Mg-unit Tabs (Calcium carbonate-vitamin d) .... Once daily  Other Orders: Est. Patient Level III (478[address])  Patient Instructions: 1)  Follow up in 6 months

## 2010-10-08 NOTE — Progress Notes (Signed)
Summary: MED ?  Phone Note Call from Patient   Caller: Susie [phone] Summary of Call: Pt's daughter left vm req a call.  Initial call taken by: Lamar Sprinkles, CMA,  September 22, 2010 10:54 AM  Follow-up for Phone Call        answered ? about omeprazole, clarified pt needs 40mg  once daily Follow-up by: Lamar Sprinkles, CMA,  September 22, 2010 5:35 PM

## 2010-10-09 NOTE — Letter (Signed)
Summary: [facility]  [facility]   Imported By: Sherian Rein 08/07/2010 10:25:00  _____________________________________________________________________  External Attachment:    Type:   Image     Comment:   External Document

## 2010-10-09 NOTE — Letter (Signed)
Summary: CMN For Oxygen / [facility]  CMN For Oxygen / [facility]   Imported By: Lennie Odor 07/08/2010 10:06:47  _____________________________________________________________________  External Attachment:    Type:   Image     Comment:   External Document

## 2010-10-20 ENCOUNTER — Telehealth: Payer: Self-pay | Admitting: Internal Medicine

## 2010-10-24 ENCOUNTER — Encounter: Payer: Self-pay | Admitting: Internal Medicine

## 2010-10-28 NOTE — Progress Notes (Signed)
Summary: RF  Phone Note Call from Patient Call back at Home Phone [phone]   Caller: Lupita Leash [phone] Summary of Call: Patient is requesting a call regarding refill of med - unsure which med.  Initial call taken by: Lamar Sprinkles, CMA,  October 20, 2010 1:18 PM  Follow-up for Phone Call        left vm for daughter, need name of med and pharmacy Follow-up by: Lamar Sprinkles, CMA,  October 20, 2010 6:05 PM  Additional Follow-up for Phone Call Additional follow up Details #1::        Spoke with daughter who request omeprazole 20mg  two times a day . She states that this is the only dosage that the pharmacy has. Would it be ok to change as requested. Thanks.Alvy Beal Archie CMA  October 21, 2010 12:01 PM    [facility].Alvy Beal Archie CMA  October 21, 2010 12:01 PM     Additional Follow-up for Phone Call Additional follow up Details #2::    Not clear what is being requested.......omperazole 20mg  is otc; omeprazole 40mg  is Rx.  It is fine to have an Rx for omeprazole 40mg , sig 1 by mouth qAM, # 30, refill as needed.  Follow-up by: Jacques Navy MD,  October 21, 2010 12:47 PM  Additional Follow-up for Phone Call Additional follow up Details #3:: Details for Additional Follow-up Action Taken: Per daughter pt needs rx for 20mg  two times a day, this is also covered as rx, some plans do not cover the 40mg  tabs. Sent in new rx...................Marland KitchenLamar Sprinkles, CMA  October 21, 2010 6:00 PM   New/Updated Medications: OMEPRAZOLE 20 MG TBEC (OMEPRAZOLE) 1 two times a day Prescriptions: OMEPRAZOLE 20 MG TBEC (OMEPRAZOLE) 1 two times a day  #60 x 12   Entered by:   Lamar Sprinkles, CMA   Authorized by:   Jacques Navy MD   Signed by:   Lamar Sprinkles, CMA on 10/21/2010   Method used:   Electronically to        [facility] [address] [phone]* (retail)       [address]       [address]       [place], [place]  [address]       Ph: [phone]       Fax: [phone]  RxID:   [id number]

## 2010-10-29 ENCOUNTER — Encounter: Payer: Self-pay | Admitting: Internal Medicine

## 2010-11-03 NOTE — Letter (Signed)
Summary: CMN for [facility] / [facility]  CMN for [facility] / [facility]   Imported By: Lennie Odor 10/28/2010 10:53:22  _____________________________________________________________________  External Attachment:    Type:   Image     Comment:   External Document

## 2010-11-12 NOTE — Letter (Signed)
Summary: [facility]N/[facility]  [facility]   Imported By: Lester Holden 11/02/2010 07:58:03  _____________________________________________________________________  External Attachment:    Type:   Image     Comment:   External Document

## 2010-12-09 LAB — GLUCOSE, CAPILLARY
Glucose-Capillary: 191 mg/dL — ABNORMAL HIGH (ref 70–99)
Glucose-Capillary: 197 mg/dL — ABNORMAL HIGH (ref 70–99)

## 2010-12-10 LAB — BASIC METABOLIC PANEL WITH GFR
BUN: 12 mg/dL (ref 6–23)
BUN: 20 mg/dL (ref 6–23)
BUN: 21 mg/dL (ref 6–23)
CO2: 25 meq/L (ref 19–32)
CO2: 25 meq/L (ref 19–32)
CO2: 26 meq/L (ref 19–32)
Calcium: 9.2 mg/dL (ref 8.4–10.5)
Calcium: 9.5 mg/dL (ref 8.4–10.5)
Calcium: 9.5 mg/dL (ref 8.4–10.5)
Chloride: 101 meq/L (ref 96–112)
Chloride: 101 meq/L (ref 96–112)
Chloride: 104 meq/L (ref 96–112)
Creatinine, Ser: 0.99 mg/dL (ref 0.4–1.2)
Creatinine, Ser: 1.07 mg/dL (ref 0.4–1.2)
Creatinine, Ser: 1.07 mg/dL (ref 0.4–1.2)
GFR calc non Af Amer: 50 mL/min — ABNORMAL LOW
GFR calc non Af Amer: 50 mL/min — ABNORMAL LOW
GFR calc non Af Amer: 55 mL/min — ABNORMAL LOW
Glucose, Bld: 212 mg/dL — ABNORMAL HIGH (ref 70–99)
Glucose, Bld: 259 mg/dL — ABNORMAL HIGH (ref 70–99)
Glucose, Bld: 313 mg/dL — ABNORMAL HIGH (ref 70–99)
Potassium: 3.8 meq/L (ref 3.5–5.1)
Potassium: 3.9 meq/L (ref 3.5–5.1)
Potassium: 4.1 meq/L (ref 3.5–5.1)
Sodium: 136 meq/L (ref 135–145)
Sodium: 136 meq/L (ref 135–145)
Sodium: 139 meq/L (ref 135–145)

## 2010-12-10 LAB — CBC
HCT: 32.4 % — ABNORMAL LOW (ref 36.0–46.0)
HCT: 33 % — ABNORMAL LOW (ref 36.0–46.0)
HCT: 33.3 % — ABNORMAL LOW (ref 36.0–46.0)
HCT: 33.5 % — ABNORMAL LOW (ref 36.0–46.0)
Hemoglobin: 10.8 g/dL — ABNORMAL LOW (ref 12.0–15.0)
Hemoglobin: 11.1 g/dL — ABNORMAL LOW (ref 12.0–15.0)
Hemoglobin: 11.2 g/dL — ABNORMAL LOW (ref 12.0–15.0)
MCHC: 32.6 g/dL (ref 30.0–36.0)
MCHC: 33.4 g/dL (ref 30.0–36.0)
MCHC: 33.5 g/dL (ref 30.0–36.0)
MCHC: 33.6 g/dL (ref 30.0–36.0)
MCV: 89.7 fL (ref 78.0–100.0)
MCV: 90 fL (ref 78.0–100.0)
MCV: 90.7 fL (ref 78.0–100.0)
MCV: 91 fL (ref 78.0–100.0)
Platelets: 329 K/uL (ref 150–400)
Platelets: 331 K/uL (ref 150–400)
Platelets: 405 10*3/uL — ABNORMAL HIGH (ref 150–400)
Platelets: 438 K/uL — ABNORMAL HIGH (ref 150–400)
RBC: 3.62 MIL/uL — ABNORMAL LOW (ref 3.87–5.11)
RBC: 3.7 MIL/uL — ABNORMAL LOW (ref 3.87–5.11)
RBC: 3.71 MIL/uL — ABNORMAL LOW (ref 3.87–5.11)
RDW: 15.6 % — ABNORMAL HIGH (ref 11.5–15.5)
RDW: 15.7 % — ABNORMAL HIGH (ref 11.5–15.5)
RDW: 15.8 % — ABNORMAL HIGH (ref 11.5–15.5)
WBC: 14.9 K/uL — ABNORMAL HIGH (ref 4.0–10.5)
WBC: 15.1 K/uL — ABNORMAL HIGH (ref 4.0–10.5)
WBC: 15.4 K/uL — ABNORMAL HIGH (ref 4.0–10.5)

## 2010-12-10 LAB — GLUCOSE, CAPILLARY
Glucose-Capillary: 183 mg/dL — ABNORMAL HIGH (ref 70–99)
Glucose-Capillary: 195 mg/dL — ABNORMAL HIGH (ref 70–99)
Glucose-Capillary: 199 mg/dL — ABNORMAL HIGH (ref 70–99)
Glucose-Capillary: 206 mg/dL — ABNORMAL HIGH (ref 70–99)
Glucose-Capillary: 214 mg/dL — ABNORMAL HIGH (ref 70–99)
Glucose-Capillary: 222 mg/dL — ABNORMAL HIGH (ref 70–99)
Glucose-Capillary: 243 mg/dL — ABNORMAL HIGH (ref 70–99)
Glucose-Capillary: 256 mg/dL — ABNORMAL HIGH (ref 70–99)
Glucose-Capillary: 265 mg/dL — ABNORMAL HIGH (ref 70–99)
Glucose-Capillary: 294 mg/dL — ABNORMAL HIGH (ref 70–99)
Glucose-Capillary: 295 mg/dL — ABNORMAL HIGH (ref 70–99)
Glucose-Capillary: 320 mg/dL — ABNORMAL HIGH (ref 70–99)

## 2010-12-10 LAB — CULTURE, RESPIRATORY W GRAM STAIN: Culture: NORMAL

## 2010-12-10 LAB — BRAIN NATRIURETIC PEPTIDE: Pro B Natriuretic peptide (BNP): 169 pg/mL — ABNORMAL HIGH (ref 0.0–100.0)

## 2010-12-10 LAB — CULTURE, BLOOD (ROUTINE X 2)

## 2010-12-10 LAB — BLOOD GAS, ARTERIAL
Acid-Base Excess: 1 mmol/L (ref 0.0–2.0)
Bicarbonate: 25.1 mEq/L — ABNORMAL HIGH (ref 20.0–24.0)
O2 Saturation: 99 %
TCO2: 26.4 mmol/L (ref 0–100)
pO2, Arterial: 118 mmHg — ABNORMAL HIGH (ref 80.0–100.0)

## 2010-12-10 LAB — COMPREHENSIVE METABOLIC PANEL
Alkaline Phosphatase: 121 U/L — ABNORMAL HIGH (ref 39–117)
BUN: 12 mg/dL (ref 6–23)
Chloride: 102 mEq/L (ref 96–112)
Glucose, Bld: 163 mg/dL — ABNORMAL HIGH (ref 70–99)
Potassium: 3.1 mEq/L — ABNORMAL LOW (ref 3.5–5.1)
Total Bilirubin: 0.3 mg/dL (ref 0.3–1.2)

## 2010-12-10 LAB — MAGNESIUM: Magnesium: 2 mg/dL (ref 1.5–2.5)

## 2010-12-10 LAB — PHOSPHORUS: Phosphorus: 2.2 mg/dL — ABNORMAL LOW (ref 2.3–4.6)

## 2010-12-10 LAB — LEGIONELLA ANTIGEN, URINE: Legionella Antigen, Urine: NEGATIVE

## 2010-12-10 LAB — TSH: TSH: 0.595 u[IU]/mL (ref 0.350–4.500)

## 2010-12-10 LAB — EXPECTORATED SPUTUM ASSESSMENT W GRAM STAIN, RFLX TO RESP C

## 2010-12-10 LAB — APTT

## 2010-12-10 LAB — STREP PNEUMONIAE URINARY ANTIGEN: Strep Pneumo Urinary Antigen: NEGATIVE

## 2010-12-10 LAB — CORTISOL: Cortisol, Plasma: 11.4 ug/dL

## 2010-12-12 LAB — BASIC METABOLIC PANEL
BUN: 18 mg/dL (ref 6–23)
CO2: 30 mEq/L (ref 19–32)
Chloride: 103 mEq/L (ref 96–112)
Creatinine, Ser: 0.78 mg/dL (ref 0.4–1.2)
GFR calc Af Amer: >60 mL/min (ref >60)
Glucose, Bld: 150 mg/dL — ABNORMAL HIGH (ref 70–99)

## 2010-12-12 LAB — CBC
HCT: 31.6 % — ABNORMAL LOW (ref 36.0–46.0)
MCHC: 33 g/dL (ref 30.0–36.0)
MCHC: 33 g/dL (ref 30.0–36.0)
MCV: 83.3 fL (ref 78.0–100.0)
MCV: 84.7 fL (ref 78.0–100.0)
Platelets: 342 10*3/uL (ref 150–400)
RBC: 3.93 MIL/uL (ref 3.87–5.11)
RDW: 17.1 % — ABNORMAL HIGH (ref 11.5–15.5)
RDW: 17.4 % — ABNORMAL HIGH (ref 11.5–15.5)

## 2010-12-12 LAB — GLUCOSE, CAPILLARY
Glucose-Capillary: 103 mg/dL — ABNORMAL HIGH (ref 70–99)
Glucose-Capillary: 105 mg/dL — ABNORMAL HIGH (ref 70–99)
Glucose-Capillary: 111 mg/dL — ABNORMAL HIGH (ref 70–99)
Glucose-Capillary: 127 mg/dL — ABNORMAL HIGH (ref 70–99)
Glucose-Capillary: 164 mg/dL — ABNORMAL HIGH (ref 70–99)
Glucose-Capillary: 168 mg/dL — ABNORMAL HIGH (ref 70–99)
Glucose-Capillary: 177 mg/dL — ABNORMAL HIGH (ref 70–99)
Glucose-Capillary: 81 mg/dL (ref 70–99)

## 2010-12-13 ENCOUNTER — Other Ambulatory Visit: Payer: Self-pay | Admitting: Internal Medicine

## 2010-12-13 LAB — BASIC METABOLIC PANEL
BUN: 11 mg/dL (ref 6–23)
BUN: 16 mg/dL (ref 6–23)
CO2: 26 mEq/L (ref 19–32)
Calcium: 8.4 mg/dL (ref 8.4–10.5)
Calcium: 8.7 mg/dL (ref 8.4–10.5)
Chloride: 104 mEq/L (ref 96–112)
Creatinine, Ser: 0.67 mg/dL (ref 0.4–1.2)
Creatinine, Ser: 0.8 mg/dL (ref 0.4–1.2)
Creatinine, Ser: 1.06 mg/dL (ref 0.4–1.2)
GFR calc Af Amer: >60 mL/min (ref >60)
GFR calc non Af Amer: >60 mL/min (ref >60)
Glucose, Bld: 158 mg/dL — ABNORMAL HIGH (ref 70–99)
Glucose, Bld: 165 mg/dL — ABNORMAL HIGH (ref 70–99)
Glucose, Bld: 264 mg/dL — ABNORMAL HIGH (ref 70–99)
Sodium: 136 mEq/L (ref 135–145)
Sodium: 142 mEq/L (ref 135–145)

## 2010-12-13 LAB — CBC
HCT: 25.9 % — ABNORMAL LOW (ref 36.0–46.0)
HCT: 29.9 % — ABNORMAL LOW (ref 36.0–46.0)
Hemoglobin: 10 g/dL — ABNORMAL LOW (ref 12.0–15.0)
Hemoglobin: 10.3 g/dL — ABNORMAL LOW (ref 12.0–15.0)
Hemoglobin: 7.7 g/dL — CL (ref 12.0–15.0)
MCHC: 32.1 g/dL (ref 30.0–36.0)
MCV: 81.5 fL (ref 78.0–100.0)
Platelets: 272 10*3/uL (ref 150–400)
Platelets: 302 10*3/uL (ref 150–400)
Platelets: 337 10*3/uL (ref 150–400)
RBC: 3.06 MIL/uL — ABNORMAL LOW (ref 3.87–5.11)
RDW: 17.3 % — ABNORMAL HIGH (ref 11.5–15.5)
RDW: 17.7 % — ABNORMAL HIGH (ref 11.5–15.5)
WBC: 13.6 10*3/uL — ABNORMAL HIGH (ref 4.0–10.5)

## 2010-12-13 LAB — GLUCOSE, CAPILLARY
Glucose-Capillary: 144 mg/dL — ABNORMAL HIGH (ref 70–99)
Glucose-Capillary: 155 mg/dL — ABNORMAL HIGH (ref 70–99)
Glucose-Capillary: 162 mg/dL — ABNORMAL HIGH (ref 70–99)
Glucose-Capillary: 170 mg/dL — ABNORMAL HIGH (ref 70–99)
Glucose-Capillary: 196 mg/dL — ABNORMAL HIGH (ref 70–99)
Glucose-Capillary: 233 mg/dL — ABNORMAL HIGH (ref 70–99)
Glucose-Capillary: 246 mg/dL — ABNORMAL HIGH (ref 70–99)
Glucose-Capillary: 273 mg/dL — ABNORMAL HIGH (ref 70–99)
Glucose-Capillary: 93 mg/dL (ref 70–99)

## 2010-12-13 LAB — HEMOGLOBIN AND HEMATOCRIT, BLOOD
HCT: 24.2 % — ABNORMAL LOW (ref 36.0–46.0)
HCT: 27.9 % — ABNORMAL LOW (ref 36.0–46.0)
Hemoglobin: 10 g/dL — ABNORMAL LOW (ref 12.0–15.0)

## 2010-12-13 LAB — COMPREHENSIVE METABOLIC PANEL
ALT: 14 U/L (ref 0–35)
Albumin: 2.9 g/dL — ABNORMAL LOW (ref 3.5–5.2)
Alkaline Phosphatase: 62 U/L (ref 39–117)
Calcium: 8.8 mg/dL (ref 8.4–10.5)
GFR calc Af Amer: >60 mL/min (ref >60)
Potassium: 3.7 mEq/L (ref 3.5–5.1)
Sodium: 141 mEq/L (ref 135–145)
Total Protein: 6.2 g/dL (ref 6.0–8.3)

## 2010-12-13 LAB — CARDIAC PANEL(CRET KIN+CKTOT+MB+TROPI)
CK, MB: 1.4 ng/mL (ref 0.3–4.0)
Relative Index: INVALID (ref 0.0–2.5)
Total CK: 48 U/L (ref 7–177)
Total CK: 65 U/L (ref 7–177)
Troponin I: 0.05 ng/mL (ref 0.00–0.06)
Troponin I: 0.06 ng/mL (ref 0.00–0.06)

## 2010-12-13 LAB — CROSSMATCH: Antibody Screen: NEGATIVE

## 2010-12-13 LAB — ABO/RH: ABO/RH(D): O POS

## 2010-12-21 LAB — GLUCOSE, CAPILLARY
Glucose-Capillary: 103 mg/dL — ABNORMAL HIGH (ref 70–99)
Glucose-Capillary: 106 mg/dL — ABNORMAL HIGH (ref 70–99)
Glucose-Capillary: 115 mg/dL — ABNORMAL HIGH (ref 70–99)
Glucose-Capillary: 98 mg/dL (ref 70–99)

## 2010-12-22 LAB — GLUCOSE, CAPILLARY: Glucose-Capillary: 123 mg/dL — ABNORMAL HIGH (ref 70–99)

## 2011-01-18 ENCOUNTER — Other Ambulatory Visit: Payer: Self-pay | Admitting: Internal Medicine

## 2011-01-19 NOTE — Discharge Summary (Signed)
NAME:  Megan Mathews, Megan Mathews NO.:  [id number]   MEDICAL RECORD NO.:  [id number]          PATIENT TYPE:  INP   LOCATION:  3707                         FACILITY:  [facility]   PHYSICIAN:  Raymon Mutton, P.A. DATE OF BIRTH:  [birth year]   DATE OF ADMISSION:  07/26/2007  DATE OF DISCHARGE:  07/29/2007                               DISCHARGE SUMMARY   DISCHARGE DIAGNOSES:  1. Right lower lobe pneumonia - improved on antibiotic therapy.  2. Hypertension.  3. Dyslipidemia.  4. Asthmatic chronic obstructive pulmonary disease with ongoing      tobacco abuse.  5. Morbid obesity.  6. Non-insulin dependent diabetes mellitus.  7. Known coronary artery disease with previous interventions remotely.  8. Remote gastrointestinal bleeding felt to be secondary to      diverticulosis.  9. Status post hysterectomy and bilateral oophorectomy.   HISTORY OF PRESENT ILLNESS:  This is a 77 year old female patient of  Nanetta Batty, M.D. and Rosalyn Gess. Norins, M.D. who presented to the  emergency room with complaints of shortness of breath, shivering, chest  pain, back pain, and gum pain.  The patient took nitroglycerin with  relief of symptoms, rested, and then later in the afternoon again  experienced the pain in the chin and chest.  She took another  nitroglycerin, called our office, and was instructed to come to the  emergency room.   HOSPITAL COURSE:  She was admitted to [facility] and we cycled  her cardiac enzymes that revealed no abnormalities.  Because of her  elevated temperature, we ordered blood culture and urine cultures.  Blood culture was negative.  Urine culture revealed multiple bacterial  morphotypes, probably contamination.   We also scheduled her for chest CT to rule out possibility of pulmonary  embolism and chest CT showed right lower lobe pneumonia.  The patient  was held in the hospital for 72 hours and Dr. Allyson Sabal planned to  catheterize her to rule out any  progression of coronary artery disease.  Because of her febrile and concern of pneumonia, the procedure was  canceled.  We started antibiotic therapy and in 72 hours she was  discharged home.   DISCHARGE MEDICATIONS:  1. Fluoxetine 20 mg daily.  2. Lasix 40 mg daily.  3. Aspirin 325 mg daily.  4. Toprol XL 50 mg daily.  5. Actos 30 mg daily.  6. Metformin 500 mg b.i.d.  7. Potassium chloride 20 mEq daily.  8. Oxybutynin 10 mg daily.  9. Advair 100/50 one puff b.i.d.  10.Crestor 10 mg daily.  11.Norvasc 5 mg daily.  12.Cipro 250 mg t.i.d. for 12 days.  13.Plavix 75 mg daily.  14.IMDUR 60 mg daily.  15.Zantac as before.   LABORATORY DATA:  Hemoglobin was 9.8, hematocrit 29.3, white blood cell  count at the time of discharge 11.3, platelets 310.  BUN 8, creatinine  0.76.   FOLLOWUP:  The patient was instructed to see her primary care physician  in 10 days after discharge.      Raymon Mutton, P.A.     MK/MEDQ  D:  09/26/2007  T:  09/26/2007  Job:  [id number]   cc:   [facility]

## 2011-01-19 NOTE — Cardiovascular Report (Signed)
NAME:  Megan Mathews, HIERONYMUS NO.:  [id number]   MEDICAL RECORD NO.:  [id number]          PATIENT TYPE:  INP   LOCATION:  3710                         FACILITY:  [facility]   PHYSICIAN:  Nanetta Batty, M.D.   DATE OF BIRTH:  19-Mar-1932   DATE OF PROCEDURE:  DATE OF DISCHARGE:                            CARDIAC CATHETERIZATION   CARDIAC CATHETERIZATION, ARCH ANGIOGRAM, SELECTIVE RIGHT AND LEFT  CAROTID ANGIOGRAM, DISTAL ABDOMINAL AORTOGRAM REPORT.   Megan Mathews is a 77 year old mildly overweight white female who  recently was at discharge from the hospital for treatment of a community-  acquired pneumonia.  She has a history of CAD status post multiple  coronary interventions in the past, involving all three  coronary  arteries.  She has had two right and one left carotid endarterectomy  procedure.  I performed distal abdominal bilateral iliac stenting in the  past, and she has moderate bilateral renal artery stenosis.  Other  problems include hypertension, hyperlipidemia and non-insulin-requiring  diabetes.  She recently has complained of some nitro-responsive chest  pain, as well jaw pain.  Carotid Dopplers revealed some progression of  disease in her left common carotid at the endarterectomy site.  She had  a previous cerebral angiography performed by myself January 2006, which  revealed 70% smooth distal left common carotid artery stenosis.  She  presents now for combined coronary arteriography, arch angiography and  selective carotid angiography.   DESCRIPTION OF PROCEDURE:  The patient brought to the second floor [facility] cardiac and peripheral vascular angiographic suites, in the  postabsorptive state.  She was premedicated with IV Benadryl, Solu-  Medrol and Pepcid, because of contrast allergy prophylaxis.  Her right  groin was prepped and shaved in the usual sterile fashion.  One percent  Xylocaine was used for local anesthesia.  A 6-French sheath was inserted  into the right femoral artery, using standard Seldinger technique.  A 6-  French right and left Judkins diagnostic catheter, as well as a [place]  pigtail catheter, a 5-French left Judkins' 4 catheter and a JB1 catheter  were used for selective cholangiography, left ventriculography,  supravalvular aortography, distal abdominal aortography and selective  right and left carotid artery angiography.  Visipaque dye was used for  the entirety of the case.  Retrograde aorta, ventricular and pullback  pressures were recorded.   HEMODYNAMIC RESULTS:  1. Aortic systolic pressure 170, diastolic pressure 60.  2. Left ventricular systolic pressure 173, end-diastolic pressure 18.   SELECTIVE CORONARY ANGIOGRAPHY:  1. Left main; left main had approximately 40% tapered ostial stenosis.      There was some damping with a 6-French catheter, and therefore a 5-      French catheter was used to more thoroughly evaluate the left main.      There is also approximately a 50% distal eccentric left main      stenosis, prior to the bifurcation.  2. LAD; the LAD had a 40% to 50% smooth segmental proximal stenosis.      The stent in the mid-LAD after the first diagonal branch was widely  patent.  3. Left circumflex; is __________  diseased with a widely patent      distal circumflex stent.  4. Right coronary artery; large dominant vessel with patent stents in      the mid and distal portion.  5. Left ventriculography; RAO left ventriculogram was performed using      25 mL of Visipaque dye at 12 mL per second.  The overall LVEF was      estimated at greater than 60%, without focal wall motion      abnormalities.  6. Arch angiography.  Arch aortogram was performed using 20 mL of      Visipaque dye at 20 mL per second.  This appeared to be a type 1      arch.  7. Distal abdominal aortography; distal abdominal aortogram was      performed using 20 mL of Visipaque dye at 20 mL per second x2.      There is 50%  right, 30% left renal artery stenosis.  There was      moderate infrarenal abdominal aortic atherosclerotic changes..      Distal aortic and bilateral proximal iliac stents were widely      patent.  Iliac stent had approximately 50% in-stent restenosis.   Selective right carotid angiography revealed the endarterectomy site to  be widely patent.  Selective left carotid angiography revealed a 70%  smooth, tapered stenosis at the distal common carotid bulb prior to the  takeoff of the internal and external,  unchanged from the prior  angiogram.   IMPRESSION:  Megan Mathews has moderate CAD with a distal left main  lesion, which I am mildly concerned about, but her stents remain widely  patent.  Her carotid disease also has remained stable over the last 3  years.  Continue medical therapy will be recommend.   Sheath was removed, and pressure was held to the groin to achieve  hemostasis.  The patient left the lab in stable condition.  She will be  gently hydrated, discharged home later today.  The patient will see me  back in the office in two or three weeks in followup.      Nanetta Batty, M.D.  Electronically Signed     JB/MEDQ  D:  08/17/2007  T:  08/17/2007  Job:  [id number]   cc:   Patient Chart  Peripheral Vascular Lab 2nd Floor [facility]  [facility]  Rosalyn Gess. Norins, MD

## 2011-01-19 NOTE — Discharge Summary (Signed)
NAME:  Megan, Mathews NO.:  [id number]   MEDICAL RECORD NO.:  [id number]          PATIENT TYPE:  INP   LOCATION:  3710                         FACILITY:  [facility]   PHYSICIAN:  Nanetta Batty, M.D.   DATE OF BIRTH:  02-May-1932   DATE OF ADMISSION:  08/17/2007  DATE OF DISCHARGE:  08/18/2007                               DISCHARGE SUMMARY   HISTORY:  Ms. Megan Mathews is a 77 year old female patient who has  known coronary artery disease, peripheral vascular disease including  iliac and abdominal aorta stenting in the past.  She also has bilateral  renal artery stenosis and carotid disease on the left.  She has had  multiple stents placed to her coronary arteries.  She apparently had  been complaining about chest pain symptoms increasing.  Her carotid  Dopplers showed aggressively increased velocities.  Thus it was decided  that she should undergo a PV angiogram and cath.  This was performed by  Dr. Allyson Mathews on August 17, 2007.  Please see his note for further  details.  She had a 70% left PCA stenosis and 70% left CEA restenosis,  no change from previous.  She had patent stent from the LAD to the left  circumflex and RCA.  She had a 50% left main.  She had 50% in restenosis  of her iliac stent.  Her aorta stent was patent.  She had a 50% right  renal artery and 30% left renal artery.  The following day, August 18, 2007, she was seen by Dr. Jacinto Halim and considered stable for discharge  home.  Her blood pressure was 129/63, temperature was 98.6, heart rate  was 78.  No labs were performed.   DISCHARGE MEDICATIONS:  1. Toprol XL 50 mg every day.  2. Aspirin 325 mg every day.  3. Metformin 500 mg twice per day.  She will start it August 19, 2007.  4. Actos 30 mg every day.  5. Klor-Con 20 mEq every day.  6. Oxybutynin ER 0 mg every day.  7. Advair 150 twice daily.  8. Norvasc 5 mg a day.  9. Plavix 75 mg a day.  10.Xanax as needed p.r.n.  11.Fluoxetine 20  mg every day.  12.Imdur 30 mg every day.  13.Ranitidine 150 mg every day.  14.Furosemide 40 mg every day.  15.Crestor 10 mg at bedtime.   DISCHARGE DIAGNOSES:  1. Angina.  2. Known coronary artery disease status post cath this admission      without progression of disease, stents patent.  She does have about      50% left main  3. Atherosclerotic peripheral vascular disease with carotid disease,      iliac, aorta and renal artery disease as stated above.  4. Hypertension.  5. Hyperlipidemia.  6. Depression.  7. Gastroesophageal reflux disease.  8. Dysuria  9. Non-insulin-dependent diabetes mellitus.   DISCHARGE INSTRUCTIONS:  1. She should have no strenuous activity, lifting, pushing, pulling or      extended walking for a week.  2. She will follow up with Dr. Allyson Mathews  on August 29, 2007 at 4 p.m.  3. She should be on a diabetic diet.      Lezlie Octave, N.P.      Nanetta Batty, M.D.  Electronically Signed    BB/MEDQ  D:  08/18/2007  T:  08/18/2007  Job:  [id number]   cc:   Rosalyn Gess. Norins, MD

## 2011-01-19 NOTE — H&P (Signed)
NAME:  Megan Mathews, Megan Mathews NO.:  [id number]   MEDICAL RECORD NO.:  [id number]          PATIENT TYPE:  EMS   LOCATION:  MAJO                         FACILITY:  [facility]   PHYSICIAN:  Nanetta Batty, M.D.   DATE OF BIRTH:  [birth year]   DATE OF ADMISSION:  07/26/2007  DATE OF DISCHARGE:                              HISTORY & PHYSICAL   CHIEF COMPLAINT:  Chin and jaw pain.   HISTORY OF PRESENT ILLNESS:  Megan Mathews is a 77 year old female known  to Dr. Allyson Sabal and Dr. Debby Bud with a history of coronary disease and  vascular disease.  She has had multiple procedures in the past, going  back into the early 90s.  She has had several interventions to the RCA  in 1995, 1996, and 1998.  Her last catheterization was in November, 1999  by Dr. Alanda Amass.  This revealed no significant restenosis of the  multiple-placed RCA stents.  The diagonal had a 70-80% stenosis that was  angioplastied.  The LAD also had a stenosis of 80% that was  angioplastied and stented.  There was a 70-80% mid circumflex lesion  that was intervened on as well with a stent by Dr. Alanda Amass.  Her LV  function was preserved at that time.  She did have a Cardiolite study  done in December, 2005 that was low risk with an EF of 64%.  Since then,  she has had problems with vascular disease.  She has had remote  bilateral carotid endarterectomies twice.  She had bilateral aortoiliac  stenting in 1996 with end-stent restenosis.  Her last peripheral  angiogram was in January, 2006, and she had right common iliac artery  end-stent restenosis and was treated with angioplasty.  She has known  carotid disease and recent carotid Doppler studies done in the office,  suggesting that she has progression of 50-99% on the left.  Dr. Allyson Sabal  does note that she occasionally has angina and takes nitroglycerin.   Today, she was going to visit her daughter at the day care her daughter  works at.  She was cold and shivering when she was  outside in the cold  weather.  When she came inside, she continued to shiver and said she  developed angina.  She describes her angina as chin and gum pain.  She  took a nitroglycerin with relief.  She went to lay down.  Later that  afternoon, she had chin pain again and then took another nitroglycerin  and called the office.  She was told to come to the emergency room by  the nurse in the office.  She presented to the emergency room at 7:00  tonight.  She is currently painfree.  She does admit to some dyspnea on  exertion and says she wonders if she has not had progression of her  coronary disease.   PAST MEDICAL HISTORY:  1. Hypertension.  2. Treated dyslipidemia.  3. Asthmatic COPD and smoking.  4. Morbid obesity.  5. Non-insulin-dependent diabetes.  6. Remote GI bleeding, felt to be secondary to diverticulosis.  7. She has had a prior abdominal  hysterectomy and bilateral      oophorectomy.   CURRENT MEDICATIONS:  1. Toprol XL 50 mg a day.  2. Aspirin 325 mg a day.  3. Metformin 500 mg b.i.d.  4. Actos 30 mg a day.  5. Potassium 20 mEq a day.  6. OxyContin ER 10 mg a day.  7. Advair 100/50 b.i.d.  8. Norvasc 5 mg a day.  9. Plavix 75 mg a day.  10.Xanax 0.25 mg b.i.d.  11.Fluoxetine 20 mg a day.  12.Imdur 60 mg a day.  13.Zantac 150 b.i.d.  14.Lasix 40 mg a day.  15.Crestor 10 mg a day.   She reportedly is allergic to IV CONTRAST.  She also says she has had an  allergic reaction to [employer], which broke her out in a rash.   SOCIAL HISTORY:  She quit smoking in 1999.  She is separated.  She has  two children and several grandchildren.   Her family history is remarkable for coronary disease in her siblings,  her parents, and her children.   REVIEW OF SYSTEMS:  Essentially unremarkable except as noted above.  Overall, she says she has been doing fairly well.   PHYSICAL EXAMINATION:  Blood pressure 148/65, pulse 74, temp 100.9.  O2  sat is 93% on room air.   GENERAL:  She is as morbidly obese, pale elderly female in no acute  distress.  HEENT:  Normocephalic.  She wears glasses.  She is somewhat hard of  hearing.  NECK:  Bilateral endarterectomy scars with bilateral carotid bruits.  CHEST:  Only a few fine crackles at the right base.  No significant  wheezing.  CARDIAC:  Regular rate and rhythm with a soft systolic murmur at the  aortic valve area and normal S1 and S2.  ABDOMEN:  Morbidly obese and nontender.  EXTREMITIES:  Without edema.  She has intact distal pulses.  NEURO:  Grossly intact.  She is alert, oriented, and cooperative.  She  is somewhat of a difficult historian.   EKG shows sinus rhythm without acute changes.  Other labs are pending.   IMPRESSION:  1. Chin pain which the patient describes as her anginal symptoms as      well as gradual onset of increasing dyspnea on exertion, rule out      progression of coronary disease.  2. Known coronary disease, status post multiple right coronary artery      interventions in the 1990s with catheterization in 1999 revealing a      patent right coronary artery site but disease in the diagonal      circumflex and left anterior descending artery, which were all      addressed at that time.  Her last Cardiolite study was without      ischemia in December, 2005 and showed normal left ventricular      function.  3. Widespread peripheral vascular disease with previous bilateral      carotid endarterectomies x2 with suggestion of progression in      disease on the left by recent carotid Dopplers as well as known      bilateral iliac disease and remote aorto-iliac stenting.  4. Treated hypertension.  5. Treated dyslipidemia.  6. Remote smoker with a history of asthmatic chronic obstructive      pulmonary disease.  7. Non-insulin-dependent diabetes.  8. Morbid obesity.  9. History of anemia in the past.  She was evaluated by Dr. Lina Sar, and it was  felt that this may be from  diverticulosis.   PLAN:  I discussed Megan Mathews's situation with Dr. Allyson Sabal on the phone.  We will admit her to telemetry and check labs.  She does have a low  grade temp, and this will be repeated.  We will go ahead and check  cultures as well as a urine culture.  We will go ahead and check her  hemoglobin.  If this is stable, we will put her on Lovenox and plan to  do a diagnostic catheterization on her tomorrow, pending her labs.      Abelino Derrick, P.A.      Nanetta Batty, M.D.  Electronically Signed    LKK/MEDQ  D:  07/26/2007  T:  07/26/2007  Job:  [id number]

## 2011-01-19 NOTE — Cardiovascular Report (Signed)
NAME:  Megan Mathews, Megan Mathews NO.:  [id number]   MEDICAL RECORD NO.:  [id number]          PATIENT TYPE:  INP   LOCATION:  2502                         FACILITY:  [facility]   PHYSICIAN:  Nanetta Batty, M.D.   DATE OF BIRTH:  22-Nov-1931   DATE OF PROCEDURE:  DATE OF DISCHARGE:                            CARDIAC CATHETERIZATION   Ms. Wilmore is a 77 year old mildly overweight Caucasian female with  history of CAD and PAD.  The other problems include hypertension, non-  insulin requiring diabetes, hyperlipidemia, morbid obesity, GERD.  Last  cath, August 17, 2007, revealing 40-50% distal left main, patent  stents in the mid LAD, distal circumflex and mid and distal RCA.  She is  status post distal abdominal aorta and bilateral iliac artery PTA and  stenting by myself in 1996.  She has had progressive claudication with  Dopplers that show in-stent restenosis.  She presents now for  angiography and potential intervention.   DESCRIPTION OF PROCEDURE:  The patient was brought to the Second Floor  [facility] PV Angiographic Suite in the postabsorptive state.  She was  premedicated with p.o. Valium, IV fentanyl, IV Benadryl, Pepcid, and  Solu-Medrol for contrast allergy prophylaxis.  Her right and left groins  were prepped and shaved in the usual sterile fashion.  Xylocaine 1% was  used for local anesthesia.  A 5-French sheath was inserted into the  right femoral artery using standard Seldinger technique under direct  fluoroscopic control.  A 5-French long pigtail catheter was used for  arch angiography and distal abdominal aortography in the PA and lateral  view.  A 5-French right Judkins catheter was used for selective right  innominate/subclavian and left subclavian artery angiography.  A  pullback gradient was also performed across the distal abdominal aorta  and the right iliac artery after administration of 200 mcg of intra-  arterial nitroglycerin via the side-arm sheath.   Visipaque dye was used  for the entirety of the case.  Retrograde aortic pressure was monitored  during the case.   ANGIOGRAPHIC RESULTS:  1. Arch aortogram.      a.     Type 1 arch.      b.     Patent right subclavian.      c.     40-50% left subclavian artery stenosis.  2. Abdominal aorta.      a.     50% mid infrarenal abdominal aortic atherosclerotic       narrowing just above the previously placed stent, seen best in the       lateral view without the pullback gradient.      b.     Distal abdominal aortic stent was widely patent with some       mild aneurysmal dilatation just proximal to it.  3. Left lower extremity.      a.     50% in-stent restenosis at the ostium of the left common       iliac artery stent.  4. Right lower extremity.      a.     80% in-stent restenosis within the  proximal portion of the       right common iliac artery stent.  There was a 55-mm gradient upon       pullback with a 5-French end-hole after administration of 200 mcg       of intra-arterial nitroglycerin.   IMPRESSION:  In-stent restenosis within both iliac stents with  claudication and abnormal Doppler ABIs.  We will proceed with bilateral  iliac artery percutaneous transluminal angioplasty and stenting using  kissing stent technique.   The existing 5-French sheath was exchanged over wire for a 6-French long  pedicle sheath.  A 6-French sheath was inserted into left femoral artery  as well using standard Seldinger technique.  A J-wire was placed through  the right sheath into the distal aorta beyond the aortic stent and the  Wholey wire was it was placed into the left sheath and the distal aorta  as well.  A 7 x 18 Genesis and OPTA premounts, 80 cm sheaths were then  placed at the iliac bifurcation under careful fluoroscopic control and  deployed simultaneously at 8-10 atmospheres using kissing stent  technique.  Unilateral inflations were then performed with each stent  balloon in the more  proximal segment of the stent extending into the  aorta at 12-14 atmospheres with a final kissing inflation and a  completion aortogram using a pigtail catheter.  The final angiographic  result was reduction of 80% in-stent restenosis on the right to less  then 30-40% and a 50% on the left to less than 20%.  There was a 20-mm  residual gradient on the right; however, because of not having a  noncompliant balloon and because of the patient's age and virtuality, it  was decided not to pursue more aggressive larger balloon dilatation.  The patient tolerated the procedure well.  There was a moderate hematoma  of the left.  The ACT at the end of the case was 155.  Both sheaths were  pulled in the PV angiographic suite and pressure was held on each groin  to achieve hemostasis.  The patient left the lab in stable condition.      Nanetta Batty, M.D.  Electronically Signed     JB/MEDQ  D:  03/31/2009  T:  03/31/2009  Job:  [id number]   cc:   Rosalyn Gess. Norins, MD  Second Floor [facility] PV angiographic Suite  [facility]

## 2011-01-19 NOTE — Discharge Summary (Signed)
NAME:  Megan Mathews, Megan Mathews NO.:  [id number]   MEDICAL RECORD NO.:  [id number]          PATIENT TYPE:  INP   LOCATION:  2035                         FACILITY:  [facility]   PHYSICIAN:  Nanetta Batty, M.D.   DATE OF BIRTH:  1931-12-13   DATE OF ADMISSION:  03/31/2009  DATE OF DISCHARGE:  04/09/2009                               DISCHARGE SUMMARY   DISCHARGE DIAGNOSES:  1. Peripheral vascular disease, right common iliac artery percutaneous      transluminal angioplasty for in-stent restenosis and left common      iliac artery percutaneous transluminal angioplasty this admission.  2. Post percutaneous transluminal angioplasty pseudoaneurysm and right      groin hematoma this admission.  3. Blood loss anemia, transfused this admission.  4. Chronic obstructive pulmonary disease.  5. Morbid obesity.  6. Non-insulin-dependent diabetes.  7. Treated hypertension.  8. Known coronary disease with multiple percutaneous coronary      interventions in the past, last catheterization in December 2008      showing a 40% left main, 50% left main prior to the bifurcation, 40-      50% left anterior descending with patent left anterior descending      stent and patent right coronary artery stent and patent distal      circumflex stent.   HOSPITAL COURSE:  The patient is a morbidly obese 77 year old female  followed by Dr. Allyson Sabal and Dr. Debby Bud with vascular disease and coronary  disease and diabetes.  She has had claudication.  She was recently seen  in the office and had abnormal ABIs with 0.8 on the left and 0.87 on the  right.  She had higher velocities in the right.  From a cardiac  standpoint, she has been stable, she had a Myoview done in August 2009,  which was negative for ischemia.  She was admitted for elective PV  angiogram by Dr. Allyson Sabal.  This was done on March 31, 2009.  This revealed  a normal right subclavian artery, 40-50% left subclavian artery, 50% mid  infrarenal abdominal  aorta with a patent distal abdominal aorta stent,  50% in-stent restenosis of the left common iliac artery stent and an 80%  in-stent restenosis within the right common iliac artery stent.  The  patient underwent PTA and stenting to both common iliac arteries, which  she tolerated well.  We had planned to send her home the next day, but  she developed pain in her right groin.  The patient dropped her  hemoglobin to 7.7.  Right groin ultrasound showed pseudoaneurysm with an  area of 3 x 3.4 cm surrounded by thrombosed area.  We attempted a  pseudoaneurysm compression on April 01, 2009.  Initially, this was  successful, but then she developed a spontaneous hematoma in the right  femoral artery afterwards.  She was transfused 2 units.  She had also  had some wheezing issues and her medications were adjusted, Xopenex seem  to help.  We cut back on her beta-blocker.  Her hematoma was watched  closely over the weekend.  At one point, we felt  it may be enlarging and  we had Dr. Madilyn Fireman see her in consult, he felt it was stable to be watched  at this time.  Followup ultrasound on Monday showed a small  pseudoaneurysm and Dr. Madilyn Fireman reviewed this and felt she will be treated  medically for now.  We watched her through the 3rd and feel she can be  discharged on April 09, 2009.  We did have case manager see her about  possible nursing home placement, but the patient insists on going home  with help.  She will be discharged in the morning of the 4th pending her  hemoglobin.   LABS AT DISCHARGE:  Her hemoglobin is 11.0 and platelets 342.  Sodium  141, potassium 3.7, BUN 13, and creatinine 0.65.  CT of her abdomen did  not show any retroperitoneal bleed.  EKG shows sinus rhythm without  acute changes.   DISCHARGE MEDICATIONS:  1. Aspirin 325 mg a day.  2. Crestor 10 mg at bedtime.  3. Oxybutynin XL 10 mg a day.  4. Amlodipine 5 mg a day.  5. Furosemide 40 mg a day.  6. Fluoxetine 20 mg a day.  7.  Isosorbide 30 mg a day.  8. Potassium 20 mEq a day.  9. Plavix 75 mg a day.  10.Multivitamin daily.  11.Amitriptyline 25 mg 1-2 at bedtime p.r.n.  12.Spiriva 18 mcg a day.  13.Glimepiride 2 mg a day.  14.Ranitidine 150 mg twice a day.  15.Alprazolam 0.25 mg q.12 p.r.n.  16.Metoprolol 12.5 mg twice a day.  17.Metformin 500 mg twice a day.  18.Advair 250/50 one puff b.i.d.  19.Xopenex 0.63 mg.  20.Handheld nebulizer q.6 p.r.n.   DISPOSITION:  The patient will be discharged on April 09, 2009, pending  hemoglobin.      Abelino Derrick, P.A.      Nanetta Batty, M.D.  Electronically Signed    LKK/MEDQ  D:  04/08/2009  T:  04/09/2009  Job:  [id number]   cc:   Rosalyn Gess. Norins, MD

## 2011-01-22 NOTE — Assessment & Plan Note (Signed)
[facility]                         GASTROENTEROLOGY OFFICE NOTE   NAME:Megan Mathews, Megan Mathews                      MRN:          [id number]  DATE:08/24/2006                            DOB:          September 09, 1931    Megan Mathews is a delightful 77 year old white female here to evaluate  intermittent rectal bleeding and discuss colonoscopy.  We saw Megan Mathews in 1997 for colorectal screening.  Megan Mathews has a history of colon  cancer in both sides of her family, her grandmother as well as maternal  grandfather.  Her colonoscopy at that time showed mild diverticulosis of  the left colon, particularly of the haustral folds in the left colon.  Megan Mathews was not Hemoccult positive, but the etiology of the blood loss was  not clear.  Most recently, her blood count has been normal.  According  to the chart, last hemoglobin in July of 2007 was 12.8, hematocrit 38.2.  Megan Mathews has a history of carotid artery disease, coronary artery disease,  hyperlipidemia, smoking, constipation.   MEDICATIONS:  1. Zantac 150 mg p.o. daily  2. Ditropan XL 5 mg p.o. daily.  3. Klor-Con 20 mEq daily.  4. Prozac 20 mg p.o. daily.  5. Plavix 75 mg p.o. daily.  6. Aspirin 325 mg p.o. daily.  7. Lasix 40 mg p.o. daily.  8. Isosorbide 60 mg p.o. daily.  9. Toprol XL 50 mg p.o. daily.  10.Norvasc 5 mg daily.  11.Actos 30 mg p.o. daily.  12.Xanax 0.25 mg b.i.d.  13.Metformin 500 mg p.o. b.i.d.  14.Advair 100/50 in the morning and at bedtime.  15.Crestor 10 mg p.o. daily.  16.Oxybutynin 10 mg p.o. daily.   PAST HISTORY:  Significant for coronary artery disease, heart attacks,  depression, diabetes.  Megan Mathews had a percutaneous shunt coronary  intervention with stent placement in the past.  Megan Mathews had a hysterectomy  and appendectomy.   FAMILY HISTORY:  Positive for colon cancer in her grandmother and  grandfather.   SOCIAL HISTORY:  Megan Mathews is separated with 2 children.  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  Megan Mathews does not smoke currently and does not drink  alcohol.   REVIEW OF SYSTEMS:  The patient has been overweight on following Weight  [employer] diet trying to lose weight.  Megan Mathews has swelling of her feet,  changes in her urination, sleeping problems, arthritic complaints,  severe fatigue, shortness of breath, leakage of urine, and muscle  cramps.   PHYSICAL EXAM:  Blood pressure 130/60, pulse 62, weight 210 pounds.  Megan Mathews was clearly overweight.  No distress.  Very cooperative.  Alert and  oriented.  Sclerae anicteric.  NECK:  With bilateral carotid bruits.  LUNGS:  Normal breath sounds.  COR:  Normal S1, normal S2.  ABDOMEN:  Obese, soft, nontender with normoactive bowel sounds.  No  fullness or tenderness.  Liver edge at costal margin.  RECTAL:  Normal rectal tone.  Stool was Hemoccult negative.  EXTREMITIES:  With 1 to 2+ edema peripherally.   IMPRESSION:  73. A 77 year old white female with a family history of colon cancer in  the grandparents, who had an essentially normal colon exam over 10      years ago.  Megan Mathews has, now, intermittent rectal bleeding, which could      be related to anal source or to some lesion in the colon, either      polyps or even cancer.  Megan Mathews is, however, not anemic and, on my exam      today, heme negative.  2. The patient has been on anticoagulants Plavix and aspirin because      of her carotid disease that is being cared for by Dr. Allyson Sabal.  Megan Mathews      is due for carotid Dopplers in January, and I would like the      results of this test before scheduling her colonoscopy.   PLAN:  1. Tentatively scheduled for colonoscopy in February 2008.  2. Obtain results of the carotid Dopplers from Dr. Allyson Sabal.  This will      be done in January 2008.  3. I would like to do her colonoscopy while Megan Mathews is on Plavix and      aspirin because of high risk for thromboembolic disease in this      patient.  Megan Mathews will also need adjustment of her diabetic medications       prior to the prep.     Hedwig Morton. Juanda Chance, MD  Electronically Signed    DMB/MedQ  DD: 08/24/2006  DT: 08/24/2006  Job #: [id number]   cc:   Rosalyn Gess. Norins, MD  Nanetta Batty, M.D.

## 2011-01-22 NOTE — Op Note (Signed)
NAME:  Megan Mathews, Megan Mathews                    ACCOUNT NO.:  [id number]   MEDICAL RECORD NO.:  [id number]                   PATIENT TYPE:  INP   LOCATION:  2550                                 FACILITY:  [facility]   PHYSICIAN:  Pershing Cox, M.D.            DATE OF BIRTH:  05/21/1932   DATE OF PROCEDURE:  12/03/2002  DATE OF DISCHARGE:                                 OPERATIVE REPORT   PREOPERATIVE DIAGNOSIS:  Bilateral adnexal masses.   POSTOPERATIVE DIAGNOSIS:  Bilateral serous cyst adenofibromas.   ANESTHESIA:  General endotracheal.   ASSISTANT:  Maxie Better, M.D.   INDICATIONS FOR PROCEDURE:  The patient is 77 year old.  She recently had a  peripheral vascular study suggesting a pelvic mass resting on her artery.  Subsequently, a CT scan was performed which showed complex adnexal masses.  She has been evaluated preoperatively by Dr. Debby Bud and Dr. Allyson Sabal and  cleared for surgery.  She is brought to the operating room today for  exploration of these adnexal masses.   FINDINGS:  There was no ascites.  There were many adhesions of the  rectosigmoid to the ovaries and to the vaginal cuff.  This required  extensive lysis of adhesions.  Frozen section for both ovarian masses  returned as serous cyst adenofibromas.   DESCRIPTION OF PROCEDURE:  The patient was brought to the operating room  with an IV in place.  She had received 1 g of Ancef in the holding area.  Thigh-high PS stockings were placed on her lower extremities.  After  successful intubation, she was placed in a frog leg position and the  anterior abdominal wall, peroneum and vagina were prepped with a solution of  Hibiclens.  The Foley catheter was sterilely inserted into the bladder.  The  patient was then draped for a midline incision.  A 0.25% Marcaine was  injected into the subumbilical tissues.  The incision site was then scored  with a knife.  Blunt dissection was used to bring this down to the  fascia.  The fascia was opened using cautery.  The fascial incision was extended  superiorly and inferiorly.  The peritoneum was tented and opened  atraumatically.  The peritoneal laser edges were extended superiorly and  inferiorly to the dome of the bladder.  The right angle retractors were used  to lift the anterior abdominal wall and 500 cc of warm saline was instilled  and agitated and collected as peritoneal washings.  The Bookwalter retractor  was placed and moist laps were placed around the abdominal walls so that  retractors could be safely placed without injury to underlying structures.   Lysis of adhesions was extensive on the right pelvic side wall as the right  adnexal mass was adherent to several of the epiploica of the transverse  colon and descending colon.  These were lysed.  Round ligament was lifted  and suture ligated and transected with cautery.  Opening  the right pelvic  side wall, we were able to visualize the IP ligament which was extended and  on tension.  This clamped, cut, suture and free-tie ligated.  This ovary and  fallopian tube were then sent to the pathology laboratory for frozen  section.  This subsequently returned as a serous adenofibroma.  The other  ovary was sent after its removal.   The multiple adhesions of the side wall on the left to the transverse and  descending colon were lysed.  Adhesive disease over the left ovary was also  lysed using sharp dissection.  The ovary was covered by several epiploica  which were also attached to the vaginal apex.  The epiploica were initially  transected and then later in the case were removed by sharp dissection.  Once the ovary had been freed from its sidewall adhesions and its adhesion  to the vaginal apex, the round ligament was identified, suture ligated and  transected.  Opening the left side wall of the ureter was visualized.  The  IP ligament was clamped, cut, suture and free-tie ligated.  The remainder  of  the posterior peritoneum was incised and the ovary was lifted free.   The cul-de-sac was inspected.  There were multiple areas of bleeding.  The  epiploica which had been previously transected were lifted and dissected  free.  Once these were removed, the edges of the cut pelvic peritoneum were  individually cauterized or free-tie ligated with suture.  Once hemostasis  had been certified, the rectosigmoid was carefully layered back into the  pelvis.  Attempts were made to find the appendix.  The cecum itself was  plastered against the right side wall and the appendix could not be  visualized.  There was also adhesive disease of the ascending colon to the  undersurface of the liver.  The gallbladder was visualized.  It was soft and  somewhat distended.  There were no stones palpated.   The small bowel was carefully layered into the pelvis.  The omentum was  drawn over the top of the small bowel.  The fascial edges were carefully  identified and held with Kocher clamps during the closure.  A double-  stranded 0 Prolene suture was used to close the incision using a looped  suture starting both at the superior and inferior portions of the incision.  These two sutures were tied in the midline and the suture was then buried.  The subcutaneous tissues were brought together with the 0 chromic.  The skin  edges were closed with skin staples.   The patient's estimated blood loss was 250 cc.  Urine output 150 cc.  Fluids  2100 cc.                                                Pershing Cox, M.D.    MAJ/MEDQ  D:  12/03/2002  T:  12/03/2002  Job:  [id number]   cc:   Rosalyn Gess. Norins, M.D. [facility]   Nanetta Batty, M.D.  [facility]   Maxie Better, M.D.  [address]  [address]  [place]  [place] [address]  Fax: [phone]

## 2011-01-22 NOTE — Assessment & Plan Note (Signed)
[facility]                             PRIMARY CARE OFFICE NOTE   NAME:Megan Mathews, Megan Mathews                      MRN:          [id number]  DATE:07/19/2006                            DOB:          [birth year]    Megan Mathews is a delightful 77 year old woman who presents for followup  evaluation and exam. She was last seen in the office June 20, 2006  because of arm pain and at that examination had a normal breast exam with  normal skin, no tethering, no mass, no abnormality was noted. She was also  treated for possible bursitis with diclofenac. She was treated for a  possible UTI based on her symptoms with Cipro. She has done well since that  time. She does feel however that her bladder is not emptying but she has no  pain or burning.   The patient is followed by Dr. Nanetta Batty and reports she has a vascular  study scheduled for January 2008. The patient is followed for hyperlipidemia  and diabetes.   PAST MEDICAL HISTORY:  1. TAH in 1979.  2. Carotid endarterectomy with redo in 2000.  3. Stenting of a AAA.  4. Stenting of bilateral iliac with a restenting procedure on the right.  5. Bilateral oophorectomies for serous cyst adenofibromas in 2004.   MEDICAL ILLNESSES:  1. Usual childhood disease.  2. CAD.  3. PVD.  4. Hyperlipidemia.  5. Tobacco abuse.  6. Hypertension.  7. Chronic constipation.  8. Mild asthma.  9. Non-insulin dependent diabetes.   CURRENT MEDICATIONS:  1. Ranitidine 150 mg daily.  2. Ditropan XL 5 mg daily.  3. Potassium 20 mEq daily.  4. Prozac 20 mg daily.  5. Plavix 75 mg daily.  6. Furosemide 40 mg daily.  7. Aspirin 325 mg daily.  8. Isosorbide 60 mg daily.  9. Toprol XL 50 mg daily.  10.Norvasc 5 mg daily.  11.Actos 30 mg daily.  12.Xanax 0.25 mg b.i.d.  13.Metformin 500 mg b.i.d.  14.Advair 100/50 one inhalation a.m. and h.s.  15.Crestor 10 mg daily.  16.Nitroglycerin 0.4 mg sublingual p.r.n.  17.Tylenol p.r.n.  18.Mucinex p.r.n.   REVIEW OF SYSTEMS:  Negative for any constitutional, cardiovascular,  respiratory, GI or GU complaints at this visit.   PHYSICAL EXAMINATION:  Examination with the assistance of Stevphen Meuse,  FNP student.  VITAL SIGNS:  Temperature was 98.3, blood pressure 136/61, pulse 57, weight  207.  GENERAL:  This is an obese Caucasian woman looking her stated age in no  acute distress.  HEENT:  Normocephalic, atraumatic. EACs and TMs were clear. Mucous membranes  were moist. She had no buccal lesions, posterior pharynx was clear.  NECK:  Supple. There was no thyromegaly.  NODES:  No adenopathy was noted in the cervical or supraclavicular region.  CHEST:  No CVA tenderness, no deformities were noted.  LUNGS:  Clear with no rales, wheezes or rhonchi.  CARDIOVASCULAR:  2+ radial pulses. She had a quiet precordium. She had a  regular rate and rhythm without murmurs. She had 1+ edema in the lower  extremities. It  was nonpitting.  BREASTS:  Deferred. The recent examination is noted.  ABDOMEN:  Obese, soft with no organosplenomegaly, no masses, no tenderness,  no guarding or rebound.  PELVIC:  Deferred.  EXTREMITIES:  Without clubbing, cyanosis, edema, deformity.  DERM:  The patient had no lesions or ulcerations.  NEUROLOGIC:  Cranial nerves II-XII are grossly intact.   CHART REVIEW:  The last office note on the chart from [facility]  dates from May 23, 2006 where she had followup lower extremity Doppler  studies. She had a note March 23, 2006 for an office visit where she was  thought to be stable from a cardiovascular perspective.   The last cardiac catheterization on the chart by Nanetta Batty September 14, 2004 which is actually looking at peripheral vascular disease.   The last echocardiogram from [facility] from April 05, 2006 read as a technically difficult study with borderline concentric left  ventricular  hypertrophy, right ventricle moderately dilated. The left atrium  is moderate to severely dilated, right atrium is severely dilated. There is  mild mitral regurgitation, there is moderate to severe tricuspid  regurgitation. The aortic valve appeared to be mildly sclerotic. EF is  greater than 55%.   The last lower extremity Doppler study from June 21, 2005 showed ABI in  the right to be 0.67, on the left 0.94.   The last carotid Doppler study from August 11, 2004 that is on the chart  from [facility] CEA of less than 70% diameter  reduction.   ASSESSMENT/PLAN:  1. Cardiovascular:  The patient is followed by Dr. Allyson Sabal for coronary      artery disease and peripheral vascular disease. She does seem currently      stable on her present medical regimen. She is to followup with Dr.      Hazle Coca office and does have a study coming up in January.  2. Pulmonary:  The patient is being treated for mild asthma with low-dose      Advair, she is doing well with no increased work of breathing or      problems.  3. Hyperlipidemia:  The last lipid panel from March 30, 2005 with a      cholesterol of 147, LDL 76, HDL 46.8 on Crestor 10. The patient to have      a followup lipid panel at her convenience. She presently has excellent      control.  4. Diabetes:  The patient's last hemoglobin A1c in Star[place] 2006 was 6.5% on      her present regimen. The patient will have a followup A1c. She has been      well-controlled on her present medical regimen but will adjust her      medications as indicated.  5. Genitourinary:  Patient with sensation of a full bladder. I wonder if      this is urogenic in nature. She is having no symptoms significant or      consistent with urinary tract infection. No treatment at this time is      required or indicated.   HEALTH MAINTENANCE:  Last colonoscopy was in 1997. The patient would be a candidate for followup colonoscopy given this is the 10 year  mark. She will  be referred to Dr. Juanda Chance for office visit given her complex medical  history. The patient's last mammogram in August 2006 was normal. She is to  schedule a followup mammogram on her own.   In  summary, this is a very pleasant patient who seems to be medically stable  with her multiple medical problems as outlined above. She is asked to return  to see me in 6 months for followup.     Rosalyn Gess Norins, MD  Electronically Signed    MEN/MedQ  DD: 07/19/2006  DT: 07/20/2006  Job #: [id number]   cc:   Kyndel L Greig Castilla, M.D.  Hedwig Morton. Juanda Chance, MD

## 2011-01-23 ENCOUNTER — Other Ambulatory Visit: Payer: Self-pay | Admitting: Internal Medicine

## 2011-01-26 NOTE — Discharge Summary (Signed)
NAME:  Megan Mathews, Megan Mathews NO.:  [id number]   MEDICAL RECORD NO.:  [id number]          PATIENT TYPE:  OIB   LOCATION:  6523                         FACILITY:  [facility]   PHYSICIAN:  Nanetta Batty, M.D.   DATE OF BIRTH:  [birth year]   DATE OF ADMISSION:  09/14/2004  DATE OF DISCHARGE:  09/15/2004                                 DISCHARGE SUMMARY   DISCHARGE DIAGNOSES:  1.  Peripheral vascular disease, status post peripheral vascular angiography      yesterday with intervention by Nanetta Batty, M.D.  The patient      underwent percutaneous transluminal angioplasty and stenting of the      common iliac artery for in-stent restenosis, also residual disease in      carotid arteries, bilateral renal arteries with 40% stenosis, left      external carotid artery had moderate disease with 70% stenosis, status      post bilateral carotid endarterectomies, 50% in-stent restenosis of the      left common iliac artery.  2.  Known coronary artery disease.  3.  Obesity.  4.  Hypertension.  5.  Hyperlipidemia.  6.  Non-insulin-dependent diabetes mellitus.  7.  Anemia.   HISTORY OF PRESENT ILLNESS:  This is a 77 year old Caucasian lady with prior  history of peripheral vascular disease who had previously two stents placed  in the external iliac arteries bilaterally and is status post bilateral  carotid endarterectomies, who was evaluated by Dr. Allyson Sabal on August 20, 2004, with complaints of claudication and abnormal PV ultrasound, who  presented to [facility] for [facility] angiogram.   HOSPITAL COURSE:  The patient was brought to the peripheral vascular lab and  underwent angiography performed by Dr. Allyson Sabal on September 14, 2004.  This  showed intact vessels in the aortic arch and __________ aortic arch, right  vertebral artery was normal, right carotid artery normal, but left carotid  artery had 70% stenosis.  The left vertebral artery was okay.  Abdominal  aorta revealed  branches of abdominal aorta revealed 40 to 50% bilateral  renal artery stenosis, moderate atherosclerosis of abdominal aorta with  distal aortic stent being patent.  The patient also had bilateral stenting  of the common iliac arteries and left common iliac artery revealed 50% in-  stent restenosis and right common iliac artery revealed 75% in-stent  restenosis.   Dr. Allyson Sabal implanted Cordis stent in the right common iliac artery.  The  patient tolerated the procedure well.  Later developed moderate ecchymosis  of the groin site and no significant bleeding.  She ambulated during the  night around the ward with no difficulty.   Richard A. Alanda Amass, M.D. assessed the patient in the morning and  considered her to be stable for discharge home.   LABORATORY DATA:  Her hemoglobin on admission was 10.8 and discharge was  9.9, hematocrit 31.5, white blood cell count 10.4, platelets 428.  Potassium  4.2, sodium 135, BUN 14, creatinine 0.9, glucose 236.   DISCHARGE MEDICATIONS:  1.  Plavix 75 mg daily.  2.  Aspirin 81 mg daily.  3.  Lasix 40 mg daily.  4.  IMDUR 60 mg daily.  5.  Toprol XL 50 mg daily.  6.  Prevacid 50 mg daily.  7.  Actos 30 mg daily.  8.  Glucophage 500 mg b.i.d.  9.  Prozac 20 mg daily.  10. Xanax 0.25 mg daily.  11. Ditropan XL 5 mg daily.  12. Norvasc 5 mg daily.  13. Potassium chloride 20 mEq daily.  14. Lipitor 40 mg daily.   Her iron saturation and TIBC were low, serum ferritin was within normal  limits which was suggestive of iron-deficiency anemia and we advised the  patient to continue with iron supplementation.  Her GI physician is Lina Sar, M.D. [facility] and a few years ago, the patient was diagnosed with anemia  because of the GI bleed of diverticulosis.  Hence it is prudent to consider  another GI evaluation with Dr. Juanda Chance.  The patient is to see Dr. Allyson Sabal on  October 09, 2004, at 3:15 p.m.  Prior to that she will have a peripheral  vascular lower  extremity ultrasound on January 20, at 11 a.m.   She was advised to avoid driving, strenuous activities, lifting greater than  5 pounds for three days, and continue with low-fat, low-cholesterol diet.      MK/MEDQ  D:  09/15/2004  T:  09/15/2004  Job:  [id number]   cc:   Nanetta Batty, M.D.  Fax: [phone]

## 2011-01-26 NOTE — Cardiovascular Report (Signed)
NAME:  Megan Mathews, Megan Mathews NO.:  [id number]   MEDICAL RECORD NO.:  [id number]          PATIENT TYPE:  OIB   LOCATION:  2899                         FACILITY:  [facility]   PHYSICIAN:  Nanetta Batty, M.D.   DATE OF BIRTH:  29-Jun-1932   DATE OF PROCEDURE:  09/14/2004  DATE OF DISCHARGE:                              CARDIAC CATHETERIZATION   PROCEDURE:  1.  Cerebral angiogram.  2.  Abdominal aortogram.  3.  Percutaneous transluminal coronary angiography with stent.   CARDIOLOGIST:  Nanetta Batty, M.D.   INDICATIONS FOR PROCEDURE:  The patient is a 77 year old, moderately  overweight black female with a history of CAD and PVOD.  She was  catheterized by Dr. Pearletha Furl. Alanda Amass on July 21, 1998, and found to  have significant LAD and diagonal branch disease, for which she had a PCI  and stenting.  She has normal LV function, bilateral moderate renal artery  stenosis.  She has had a PT and stenting of her abdominal aorta and  bilateral iliacs.  She has had bilateral carotid endarterectomies in 1985,  with a redo of her right in 2000.  Other problems include hypertension,  hyperlipidemia, and non-insulin-requiring diabetes.  Carotid Dopplers  suggested high-grade disease in her distal left common carotid.  She also  had high velocities in both iliac arteries by duplex with ABIs to the 0.9 to  1.0 range.  She does complain of claudication, right greater than left.  She  presents now for a cerebral angiography, abdominal aortography and potential  intervention.   DESCRIPTION OF PROCEDURE:  The patient was brought to the sixth floor [facility] Peripheral Vascular Angiographic Suite in the post-  absorptive state.  She was premedicated with p.o. Valium.  Her right groin  was prepped and shaved in the usual sterile fashion.  Xylocaine 1% was used  for local anesthesia.  A 5-French sheath was inserted into the right femoral  artery using standard  Seldinger technique.  A 5-French tennis-racket  catheter and pigtail catheter were used for arch angiography and distal  abdominal aortography, as well as selective right vertebral, right carotid,  left carotid and left vertebral angiography, both intracranial and  extracranial views.  Visipaque dye was used for the entirety of the case.  Retrograde aortic pressures monitored throughout the case.   ANGIOGRAPHIC RESULTS:  1.  Arch aortogram.  Type 1 arch with 30% ostial narrowing of the innominate      artery.  2.  Right vertebral.  Widely patent both intracranial and extracranial.  3.  Right carotid:  A 30% right common carotid artery stenosis.  The      endarterectomy site is widely patent.  Intracranial and extracranial      anatomy were normal.  4.  Left common carotid:  The endarterectomy site at the distal left common      carotid artery, approximately a 75% complex stenosis going into the      internal carotid artery.  Intra-cranial and extra-cranial anatomy is      otherwise unremarkable.  5.  Left vertebral:  This  is widely patent with normal anatomy      intracranially and extracranially.  6.  Abdominal aortogram:  The abdominal aortography was performed using 20      mL of Visipaque dye at 20 mL per second.  The renal arteries revealed      50% bilateral renal artery stenosis.  The infrarenal abdominal aorta had      moderate atherosclerotic changes.  7.  Left lower extremity:  There was a 50% in-stent restenosis in the ostial      left common iliac artery stent.  8.  Right lower extremity:  There was a 75% in-stent restenosis within the      ostial right common iliac artery stent.   IMPRESSION:  1.  Restenosis within the left carotid endarterectomy site.  2.  Stable moderate bilateral renal artery stenosis.  3.  Patent left common iliac artery stent with high-grade in-stent      restenosis within the right common iliac artery.   PLAN:  PT and stenting of the right common  iliac artery stent.   DESCRIPTION OF PROCEDURE:  The patient received 1500 units of heparin  intravenously.  The 5-French short sheath was exchanged over a 3.5 Wholey  wire for a 6-French 35 cm Bright Tip Cordis sheath.  A PTA was performed  with a 6.2 Opta, followed by a 7.15 Opta, resulting in a small subintimal  dissection at the most proximal edge of the stent, with some subintimal  extravasation of contrast.  Following this, an 8 x 2 Smart stent was then  deployed in an overlapping fashion, completely sealing the dissection,  resulting in a reduction of a 75% lesion to less than 30% residual.   OVERALL IMPRESSION:  Successful right common iliac artery percutaneous  transluminal coronary angiography and stenting for in-stent restenosis and  symptomatic claudication.   The ACT was measured and the sheath was removed.  Pressure was held on the  groin to obtain hemostasis.  The patient left the laboratory in stable  condition.  She will be monitored overnight.  Her labs will be checked in  the morning, after which time she will be discharged home.   FOLLOWUP:  1.  I will see her back in the office in approximately two weeks.  She will      be treated with aspirin and Plavix.  2.  She will be sent back to her vascular surgeon for consideration of      repeat endarterectomy.      JB/MEDQ  D:  09/14/2004  T:  09/14/2004  Job:  [id number]   cc:   Angiographic Laboratory - [facility]   [facility]   Rosalyn Gess. Norins, M.D. [facility]

## 2011-01-26 NOTE — Discharge Summary (Signed)
NAME:  Megan Mathews, Megan Mathews                    ACCOUNT NO.:  [id number]   MEDICAL RECORD NO.:  [id number]                   PATIENT TYPE:  INP   LOCATION:  5741                                 FACILITY:  [facility]   PHYSICIAN:  Pershing Cox, M.D.            DATE OF BIRTH:  April 24, 1932   DATE OF ADMISSION:  12/03/2002  DATE OF DISCHARGE:  12/08/2002                                 DISCHARGE SUMMARY   ADMITTING DIAGNOSIS:  Bilateral adnexal masses.   DISCHARGE DIAGNOSIS:  Bilateral serous cystadenofibromas of the ovaries.   HISTORY OF PRESENT ILLNESS:  For details of the patient's admission history  and physical, please see the note dated December 03, 2002 transcribed in the  patient's chart.  Briefly, she is 77 years old and is asymptomatic.  During  a peripheral vascular study of her lower extremities, it was suggested that  she had something pressing on them which might be a big ovary.  Subsequently, a CT scan was performed which showed bilateral complex adnexal  masses.  The patient has a cardiac history and was seen preoperatively by  both Dr. Rosalyn Gess. Norins, her family doctor, and by Dr. Nanetta Batty,  her cardiologist.  She was prepped for surgery and is brought to the  operating room today on the day of admission.   HOSPITAL COURSE:  On the day of admission, the patient was taken to the  operating room where exploratory laparotomy, lysis of adhesions and  bilateral salpingo-oophorectomy were performed.  Specimens included  peritoneal washings and bilateral fallopian tubes and ovaries.  Procedure  was uncomplicated.  Her estimated blood loss was 200 mL.  The diagnosis was  made by frozen section and therefore, no staging procedure was performed.   In the immediate postoperative period, the patient was alert and conversant  and had no complaints of pain.  She had had a significant elevation of her  blood sugar in the emergency room one week before surgery and also on her  preoperative labs.  For this reason, medicine service was consulted in the  immediate postoperative period and they evaluated her, making a decision to  start her on sliding-scale insulin and to begin on an oral anti-hypoglycemic  medication the day after surgery.  The patient was admitted to the intensive  care unit because of her cardiac history.  She was switched to a monitored  bed status on postoperative day #1.  On the evening of her surgery, the  patient was fully awake.  Her O2 saturations were 98% on 2L.  Urine output  was acceptable.  On the morning of postoperative day #1, the patient had no  complaints, no nausea; her blood pressure was stable, saturations were 98%  on 2 L, her CBGs were anywhere from 190 to 270.  She received sliding-scale  insulin throughout the next three days until she was placed on oral anti-  hypoglycemics.   The patient was maintained in  the intensive care unit while waiting for a  monitored bed on the floor.  On postoperative day #2, she had no complaints.  T-max was 100.9 and then the rest of the measurements were normal.  She  began a diuresis on this day.  She was beginning on clear liquids but had no  flatus.  She was followed by the medicine service, who recommended an ADA  diet when she could begin taking medications.  On the evening of  postoperative day #2, she complained of nausea after having some solid food  for lunch.  She was very symptomatic and was feeling quite ill.  We moved  her diet back to water with sips only and on the morning of postoperative  day #3, her nausea was better.  She had been given a Dulcolax suppository  and had a little discharge from that, but no evidence of flatus.  Her wound  continued to look good but because there was a little bit of drainage on the  lower pad, the wound was prepped with Betadine and probed; there was no  evidence of a seroma.  On the evening of postoperative day #3, the patient  had no nausea.   She was given again a clear liquid diet but tolerated it  poorly.  On the morning of postoperative day #4, she was out of bed and  ambulating frequently during the day.  She had no specific complaints, but  continued to have no flatus.  That evening, she did have flatus and  continued to have a slightly distended abdomen.  That evening, I began her  on her regular medications.  She was able to take Darvocet for pain.  She  was able to tolerate Actos.  On the morning of postoperative day #4, she was  able to eat a regular diet, CBGs were adequate and she was able to be  discharged home with a prescription for Actos.  She was to resume all of her  usual medications at home.  She was to come to my office on Monday for a  glucose and also to have her staples removed.  The patient had peritoneal  washings obtained at the time of her surgery.  These peritoneal washings  returned showing atypical cells with multiple psammoma bodies.  These  psammoma bodies were present on some of the samples of tissue from the  pelvis which had held the ovary down.  The ovary itself was totally benign.  This finding was unexpected.  Certainly, there is no plan at this time to do  further surgery, but we will evaluate her in about six months with another  CT scan.   CONDITION AT THE TIME OF DISCHARGE:  Stable and improved.   DISCHARGE DIAGNOSIS:  Bilateral fibroadenomas of the ovaries.                                               Pershing Cox, M.D.    MAJ/MEDQ  D:  12/11/2002  T:  12/13/2002  Job:  [id number]

## 2011-02-07 ENCOUNTER — Other Ambulatory Visit: Payer: Self-pay | Admitting: Internal Medicine

## 2011-02-24 ENCOUNTER — Other Ambulatory Visit: Payer: Self-pay | Admitting: Internal Medicine

## 2011-02-24 NOTE — Telephone Encounter (Signed)
Pt would like to know if it is Ok for her to take OTC Benadryl.?

## 2011-02-25 ENCOUNTER — Telehealth: Payer: Self-pay | Admitting: *Deleted

## 2011-02-25 NOTE — Telephone Encounter (Signed)
Error

## 2011-02-25 NOTE — Telephone Encounter (Signed)
okfor refill x 5

## 2011-02-25 NOTE — Telephone Encounter (Signed)
Pharm is req RF of xanax.

## 2011-02-26 ENCOUNTER — Telehealth: Payer: Self-pay | Admitting: *Deleted

## 2011-02-26 MED ORDER — ALPRAZOLAM 0.25 MG PO TABS: 0.2500 mg | ORAL_TABLET | Freq: Two times a day (BID) | ORAL | Status: DC | PRN

## 2011-02-26 NOTE — Telephone Encounter (Signed)
error 

## 2011-02-26 NOTE — Telephone Encounter (Signed)
Called in.

## 2011-03-15 ENCOUNTER — Other Ambulatory Visit: Payer: Self-pay | Admitting: Internal Medicine

## 2011-03-27 ENCOUNTER — Other Ambulatory Visit: Payer: Self-pay | Admitting: Internal Medicine

## 2011-04-07 DIAGNOSIS — K2970 Gastritis, unspecified, without bleeding: Secondary | ICD-10-CM

## 2011-04-07 HISTORY — DX: Gastritis, unspecified, without bleeding: K29.70

## 2011-04-18 ENCOUNTER — Other Ambulatory Visit: Payer: Self-pay | Admitting: Internal Medicine

## 2011-04-24 ENCOUNTER — Other Ambulatory Visit: Payer: Self-pay | Admitting: Internal Medicine

## 2011-04-26 ENCOUNTER — Encounter: Payer: Self-pay | Admitting: Pulmonary Disease

## 2011-04-27 ENCOUNTER — Ambulatory Visit (INDEPENDENT_AMBULATORY_CARE_PROVIDER_SITE_OTHER)
Admission: RE | Admit: 2011-04-27 | Discharge: 2011-04-27 | Disposition: A | Discharge status: Home / Self Care | Payer: Medicare Other | Source: Ambulatory Visit | Attending: Pulmonary Disease | Admitting: Pulmonary Disease

## 2011-04-27 ENCOUNTER — Encounter: Payer: Self-pay | Admitting: Pulmonary Disease

## 2011-04-27 ENCOUNTER — Ambulatory Visit (INDEPENDENT_AMBULATORY_CARE_PROVIDER_SITE_OTHER): Payer: Medicare Other | Admitting: Pulmonary Disease

## 2011-04-27 ENCOUNTER — Other Ambulatory Visit (INDEPENDENT_AMBULATORY_CARE_PROVIDER_SITE_OTHER): Payer: Medicare Other

## 2011-04-27 VITALS — BP 118/70 | HR 86 | Temp 97.6°F | Ht 60.0 in | Wt 193.4 lb

## 2011-04-27 DIAGNOSIS — R0902 Hypoxemia: Secondary | ICD-10-CM

## 2011-04-27 DIAGNOSIS — R0609 Other forms of dyspnea: Secondary | ICD-10-CM

## 2011-04-27 DIAGNOSIS — R0989 Other specified symptoms and signs involving the circulatory and respiratory systems: Secondary | ICD-10-CM

## 2011-04-27 DIAGNOSIS — J4489 Other specified chronic obstructive pulmonary disease: Secondary | ICD-10-CM

## 2011-04-27 DIAGNOSIS — R0600 Dyspnea, unspecified: Secondary | ICD-10-CM | POA: Insufficient documentation

## 2011-04-27 DIAGNOSIS — G4733 Obstructive sleep apnea (adult) (pediatric): Secondary | ICD-10-CM

## 2011-04-27 DIAGNOSIS — J449 Chronic obstructive pulmonary disease, unspecified: Secondary | ICD-10-CM

## 2011-04-27 LAB — CBC WITH DIFFERENTIAL/PLATELET
Basophils Relative: 0.5 % (ref 0.0–3.0)
Eosinophils Absolute: 0.3 10*3/uL (ref 0.0–0.7)
HCT: 27.2 % — ABNORMAL LOW (ref 36.0–46.0)
Hemoglobin: 8.6 g/dL — ABNORMAL LOW (ref 12.0–15.0)
MCHC: 31.6 g/dL (ref 30.0–36.0)
MCV: 80.8 fl (ref 78.0–100.0)
Monocytes Absolute: 1 10*3/uL (ref 0.1–1.0)
Neutro Abs: 7.3 10*3/uL (ref 1.4–7.7)
RBC: 3.36 Mil/uL — ABNORMAL LOW (ref 3.87–5.11)

## 2011-04-27 LAB — BASIC METABOLIC PANEL
CO2: 27 mEq/L (ref 19–32)
Chloride: 107 mEq/L (ref 96–112)
Creatinine, Ser: 0.9 mg/dL (ref 0.4–1.2)
Sodium: 141 mEq/L (ref 135–145)

## 2011-04-27 NOTE — Assessment & Plan Note (Addendum)
She maintained her oxygen saturation with exertion using 3 liters.  Her daughter will d/w pt about whether she wants a portable concentrator and call to notify of decision.

## 2011-04-27 NOTE — Patient Instructions (Signed)
Chest xray and lab tests today >> will call with results Follow up in 6 months 

## 2011-04-27 NOTE — Assessment & Plan Note (Signed)
I do not think her current symptoms are related to worsening COPD.  She has not noticed any difference w/o spiriva.  Will not renew this.  She is to continue with advair and albuterol.

## 2011-04-27 NOTE — Progress Notes (Signed)
Subjective:    Patient ID: Megan Mathews, female    DOB: [birth year], 77 y.o.   MRN: [id number]  HPI 77 yo female former smoker with COPD, OSA using BPAP 10/7 and chronic hypoxic respiratory failure using 3 L O2.  She ran out of spiriva a few weeks ago.  She has not noticed any difference w/o spiriva  She continues to use advair and albuterol once per day.  She is not having much cough, or wheeze.  Her daughter feels like she gets winded more easily with activity.  This improves shortly after she rests.  She goes to the store 2 or 3 times per week.  Otherwise her activity level consists of short walks around her apartment.  She is using her BPAP every night.  She is not having any trouble with her mask.  She feels BPAP helps her sleep and energy level  Past Medical History  Diagnosis Date  . COPD (chronic obstructive pulmonary disease)   . OSA (obstructive sleep apnea)   . Hypoxemia   . Secondary pulmonary hypertension   . CAD (coronary artery disease)   . Diabetes mellitus type II   . Hyperlipidemia   . Hypertension   . Peripheral vascular disease   . Tobacco abuse   . Constipation      No family history on file.   History   Social History  . Marital Status: Widowed    Spouse Name: N/A    Number of Children: N/A  . Years of Education: N/A   Occupational History  . Not on file.   Social History Main Topics  . Smoking status: Former Smoker    Types: Cigarettes    Quit date: 09/06/1996  . Smokeless tobacco: Not on file  . Alcohol Use: Not on file  . Drug Use: Not on file  . Sexually Active: Not on file   Other Topics Concern  . Not on file   Social History Narrative  . No narrative on file     Allergies  Allergen Reactions  . Iohexol      Code: FEVER, Desc: per pt: rash fever unable to stand     Review of Systems     Objective:   Physical Exam BP 118/70  Pulse 86  Temp(Src) 97.6 F (36.4 C) (Oral)  Ht 5' (1.524 m)  Wt 193 lb 6.4 oz (87.726 kg)   BMI 37.77 kg/m2  SpO2 99%  General - Pale, wearing oxygen HEENT - pale sclera, no sinus tenderness, no oral exudate, wearing dentures, no LAN Cardiac - s1s2 no murmur Chest - prolonged exhalation, no wheeze/rales Abd - soft, nontender Ext - no edema Neuro - decreased hearing, normal strength Psych - normal mood/behavior  Spirometry 04/27/11>>FEV1 1.24(74%), FEV1% 74    Assessment & Plan:   COPD I do not think her current symptoms are related to worsening COPD.  She has not noticed any difference w/o spiriva.  Will not renew this.  She is to continue with advair and albuterol.  OBSTRUCTIVE SLEEP APNEA She is compliant with BPAP and demonstrates benefit from therapy.  Chronic respiratory failure with hypoxia She maintained her oxygen saturation with exertion using 3 liters.  Her daughter will d/w pt about whether she wants a portable concentrator and call to notify of decision.  Dyspnea I think her current symptoms are related to deconditioning.  Her spirometry did not show significant change since 2009.  She maintained her oxygenation on exertion.  Will check labs and chest  xray to further assess.  Will call her daughter with results [phone] work, [phone] cell).    Updated Medication List Outpatient Encounter Prescriptions as of 04/27/2011  Medication Sig Dispense Refill  . albuterol (PROAIR HFA) 108 (90 BASE) MCG/ACT inhaler 2 puffs up to 4 times a day as needed       . albuterol (PROVENTIL) (2.5 MG/3ML) 0.083% nebulizer solution Take 2.5 mg by nebulization every 6 (six) hours as needed.       . ALPRAZolam (XANAX) 0.25 MG tablet Take 1 tablet (0.25 mg total) by mouth 2 (two) times daily as needed for anxiety.  62 tablet  5  . amitriptyline (ELAVIL) 25 MG tablet TAKE 1 TABLET BY MOUTH AT BEDTIME FOR PERIPHERAL NEUROPATHY. MAY INCREASE TO 2 TABLETS IF NEEDED  270 tablet  0  . amLODipine (NORVASC) 5 MG tablet Take 5 mg by mouth daily.        Marland Kitchen aspirin 325 MG tablet Take 325 mg  by mouth daily.        . Calcium Carbonate-Vitamin D (CALCIUM 600+D) 600-400 MG-UNIT per tablet Take 1 tablet by mouth daily.        Marland Kitchen FLUoxetine (PROZAC) 20 MG capsule TAKE 1 CAPSULE BY MOUTH DAILY  90 capsule  0  . Fluticasone-Salmeterol (ADVAIR DISKUS) 250-50 MCG/DOSE AEPB Inhale 1 puff into the lungs every 12 (twelve) hours.        . furosemide (LASIX) 40 MG tablet Take 40 mg by mouth daily.        Marland Kitchen glimepiride (AMARYL) 2 MG tablet TAKE 1 TABLET BY MOUTH EVERY DAY  90 tablet  0  . guaifenesin (MUCUS RELIEF) 400 MG TABS 2 tablets every 12 hours as needed       . isosorbide mononitrate (IMDUR) 60 MG 24 hr tablet 1.5 tablets daily       . metFORMIN (GLUCOPHAGE) 500 MG tablet TAKE 1 TABLET TWICE DAILY WITH FOOD  180 tablet  0  . metoprolol (TOPROL-XL) 50 MG 24 hr tablet TAKE 1 TABLET BY MOUTH ONCE DAILY  30 tablet  0  . Multiple Vitamins-Minerals (MULTIVITAL PO) Once a day       . nitroGLYCERIN (NITROSTAT) 0.4 MG SL tablet Place 0.4 mg under the tongue every 5 (five) minutes as needed.        Marland Kitchen omeprazole (PRILOSEC) 40 MG capsule Take 20 mg by mouth 2 (two) times daily.       Marland Kitchen oxybutynin (DITROPAN-XL) 10 MG 24 hr tablet Take 10 mg by mouth daily.        Marland Kitchen PENTOXIFYLLINE PO 1 tablet 3 times a day       . PLAVIX 75 MG tablet TAKE 1 TABLET BY MOUTH DAILY  90 tablet  0  . potassium chloride (MICRO-K) 10 MEQ CR capsule TAKE 1 TABLET BY MOUTH TWICE DAILY OR 2 TABLETS BY MOUTH ONCE DAILY  60 capsule  0  . SENNOSIDES-DOCUSATE SODIUM PO Once a day       . simvastatin (ZOCOR) 80 MG tablet TAKE 1 TABLET BY MOUTH EVERY EVENING  30 tablet  0  . DISCONTD: levalbuterol (XOPENEX) 1.25 MG/0.5ML nebulizer solution 1 vial up to 4 times a day       . DISCONTD: nystatin (NYSTOP) 100000 UNIT/GM POWD APPLY TO AFFECTED AREA TWICE DAILY AS NEEDED  30 g  0  . DISCONTD: albuterol (PROAIR HFA) 108 (90 BASE) MCG/ACT inhaler Inhale 2 puffs into the lungs every 4 (four) hours as needed.        Marland Kitchen  DISCONTD: levalbuterol  (XOPENEX) 1.25 MG/3ML nebulizer solution 1 vial up to 4 times a day       . DISCONTD: tiotropium (SPIRIVA) 18 MCG inhalation capsule Place 18 mcg into inhaler and inhale daily.

## 2011-04-27 NOTE — Assessment & Plan Note (Signed)
She is compliant with BPAP and demonstrates benefit from therapy.

## 2011-04-27 NOTE — Assessment & Plan Note (Addendum)
I think her current symptoms are related to deconditioning.  Her spirometry did not show significant change since 2009.  She maintained her oxygenation on exertion.  Will check labs and chest xray to further assess.  Will call her daughter with results [phone] work, [phone] cell).

## 2011-04-28 ENCOUNTER — Telehealth: Payer: Self-pay | Admitting: *Deleted

## 2011-04-28 ENCOUNTER — Telehealth: Payer: Self-pay | Admitting: Pulmonary Disease

## 2011-04-28 NOTE — Telephone Encounter (Signed)
Pt needs OV for f/u anemia due to recent labs. Spoke with daughter and scheduled for OV Tuesday at 10:45

## 2011-04-28 NOTE — Telephone Encounter (Signed)
Results d/w pt's daughter Lynnell Dike.  She is more anemic with Hb 8.6.  D/W Dr. Debby Bud who will arrange for ROV to further assess.

## 2011-05-04 ENCOUNTER — Ambulatory Visit: Payer: Medicare Other

## 2011-05-04 ENCOUNTER — Ambulatory Visit (INDEPENDENT_AMBULATORY_CARE_PROVIDER_SITE_OTHER): Payer: Medicare Other | Admitting: Internal Medicine

## 2011-05-04 DIAGNOSIS — D649 Anemia, unspecified: Secondary | ICD-10-CM

## 2011-05-04 DIAGNOSIS — E1149 Type 2 diabetes mellitus with other diabetic neurological complication: Secondary | ICD-10-CM

## 2011-05-04 DIAGNOSIS — D631 Anemia in chronic kidney disease: Secondary | ICD-10-CM | POA: Insufficient documentation

## 2011-05-04 LAB — HEMOGLOBIN A1C: Hgb A1c MFr Bld: 7.9 % — ABNORMAL HIGH (ref 4.6–6.5)

## 2011-05-04 LAB — HEMOGLOBIN: Hemoglobin: 8.2 g/dL — ABNORMAL LOW (ref 12.0–15.0)

## 2011-05-04 NOTE — Progress Notes (Signed)
  Subjective:    Patient ID: Kenisha L Drapeau, female    DOB: 04-Jun-1932, 77 y.o.   MRN: [id number]  HPI Mrs. Canter presents for anemia - drop in Hgb 10.5 Nov '11 to 8.6 Aug '12. She has not had any significant bleeding. She has had a problem with constipation with abdominal pain but no hematochezia. She has had increased SOB and an increase in her angina.  I have reviewed the patient's medical history in detail and updated the computerized patient record.    Review of Systems  Constitutional: Negative for diaphoresis, activity change and fatigue.  HENT: Negative.   Respiratory: Negative for cough, choking, chest tightness and wheezing.   Cardiovascular: Negative.   Gastrointestinal: Negative.   Musculoskeletal: Negative.        Objective:   Physical Exam Vitals noted - normal heart rate Gen'l - overweight chronically ill-appearing white woman who looks significantly moe pale than usual. HEENT - C&S pale, oropharynx normal Cor - RRR Chest - CTAP Abdomen - obese, mild tnederness.      Assessment & Plan:

## 2011-05-04 NOTE — Patient Instructions (Addendum)
Anemia - a hemoglobin of 8.6 can make you very short of breath and can cause angina. We need to determine if you have lost blood or if you are not making blood. Lab work today will help.  Constipation - you should take something every day to keep a regular bowel habit. Start with Milk of magnesia 1 capful every 4 hours until a good BM or 4 doses in 24 hours. Once you have had relief of constipation you can take 1/2 capful every PM.  Diabetes - will check A1C today and also prescribe test strips.   Anemia - Nonspecific Your exam and blood tests show you are anemic. This means your blood (hemoglobin) level is low. Normal hemoglobin values are 12-15 for females and 14-17 for males. Make a note of your hemoglobin level today. The hematocrit percent is also used to measure anemia. A normal hematocrit is 38-46 in females and 42-49 in males. Make a note of your hematocrit level today. SYMPTOMS Anemia can come on suddenly (acute). It can also come on slowly (chronic). Symptoms can include:  Minor weakness.     Dizziness.    Palpitations.   Shortness of breath.     Symptoms may be absent until half your hemoglobin is missing if it comes on slowly. Anemia due to acute blood loss from an injury or internal bleeding may require blood transfusion if the loss is severe. Hospital care is needed if you are anemic and there is significant continued blood loss. CAUSES Anemia can be due to many different causes.  Excessive bleeding from periods is a common problem in women.  Other causes can include:  Intestinal bleeding.   Poor nutrition.   Kidney, thyroid, liver, and bone marrow diseases.  TREATMENT  Stool tests for blood (Hemoccult) and additional lab tests are often needed. This determines the best treatment.   Further checking on your condition and your response to treatment is very important. It often takes many weeks to correct anemia.  Depending on the cause, treatment can  include:  Supplements of iron.   Vitamins B12 and folic acid.   Hormone medicines.  If your anemia is due to bleeding, finding the cause of the blood loss is very important. This will help avoid further problems. SEEK IMMEDIATE MEDICAL CARE IF:  You develop fainting, extreme weakness, shortness of breath, or chest pain.   You develop heavy vaginal bleeding.   You develop bloody or black, tarry stools or vomit up blood.   You develop a high fever, rash, repeated vomiting, or dehydration.  Document Released: 09/30/2004 Document Re-Released: 02/10/2010 [facility] Patient Information 2011 [place], [place].Anemia - Nonspecific Your exam and blood tests show you are anemic. This means your blood (hemoglobin) level is low. Normal hemoglobin values are 12-15 for females and 14-17 for males. Make a note of your hemoglobin level today. The hematocrit percent is also used to measure anemia. A normal hematocrit is 38-46 in females and 42-49 in males. Make a note of your hematocrit level today. SYMPTOMS Anemia can come on suddenly (acute). It can also come on slowly (chronic). Symptoms can include:  Minor weakness.     Dizziness.    Palpitations.   Shortness of breath.     Symptoms may be absent until half your hemoglobin is missing if it comes on slowly. Anemia due to acute blood loss from an injury or internal bleeding may require blood transfusion if the loss is severe. Hospital care is needed if you are anemic and  there is significant continued blood loss. CAUSES Anemia can be due to many different causes.  Excessive bleeding from periods is a common problem in women.  Other causes can include:  Intestinal bleeding.   Poor nutrition.   Kidney, thyroid, liver, and bone marrow diseases.  TREATMENT  Stool tests for blood (Hemoccult) and additional lab tests are often needed. This determines the best treatment.   Further checking on your condition and your response to treatment is very  important. It often takes many weeks to correct anemia.  Depending on the cause, treatment can include:  Supplements of iron.   Vitamins B12 and folic acid.   Hormone medicines.  If your anemia is due to bleeding, finding the cause of the blood loss is very important. This will help avoid further problems. SEEK IMMEDIATE MEDICAL CARE IF:  You develop fainting, extreme weakness, shortness of breath, or chest pain.   You develop heavy vaginal bleeding.   You develop bloody or black, tarry stools or vomit up blood.   You develop a high fever, rash, repeated vomiting, or dehydration.  Document Released: 09/30/2004 Document Re-Released: 02/10/2010 [facility] Patient Information 2011 [place], [place].

## 2011-05-05 ENCOUNTER — Telehealth: Payer: Self-pay | Admitting: Internal Medicine

## 2011-05-05 LAB — RETICULOCYTES: ABS Retic: 59.2 10*3/uL (ref 19.0–186.0)

## 2011-05-05 NOTE — Telephone Encounter (Signed)
Daughter Susie called, patient would prefer [facility] b/c daughter, Lupita Leash, who has multiple medical problems will be w/her and they feel [facility] is more accessible and closer to Donna's home.   If you would like, We can try to call admitting in the AM.

## 2011-05-05 NOTE — Assessment & Plan Note (Signed)
Patient with drop in Hgb over the past week or so. She been sympotmatic with SOB, increase C/P Plan - repeat H/H, retic count and iron panel.            If Hgb decreased further and if iron deficiency will consier admission for IV iron

## 2011-05-05 NOTE — Telephone Encounter (Signed)
Patient and daughter, Lupita Leash informed. Also added to AM schedule, pt will arrive at 8:30  I called admitting and gave them info needed- They will call our office tomorrow in the am with info about bed.

## 2011-05-05 NOTE — Telephone Encounter (Signed)
Hgb 8.2 down from 8.6, iron levels very low consistent with blood loss anemia. A1C 7.9% - could use some work on blood sugar control.  Will arrange for direct admission - please call [facility] and request elective admission for Thursday, 8/30. Have patient come to office for admission exam and orders - 8:30 AM.  For questions call me.  Thanks

## 2011-05-06 ENCOUNTER — Ambulatory Visit: Payer: Medicare Other | Admitting: Internal Medicine

## 2011-05-06 ENCOUNTER — Inpatient Hospital Stay (HOSPITAL_COMMUNITY)
Admission: AD | Admit: 2011-05-06 | Discharge: 2011-05-07 | DRG: 812 | Disposition: A | Discharge status: Home / Self Care | Payer: Medicare Other | Source: Ambulatory Visit | Attending: Internal Medicine | Admitting: Internal Medicine

## 2011-05-06 ENCOUNTER — Encounter: Payer: Self-pay | Admitting: Internal Medicine

## 2011-05-06 DIAGNOSIS — D649 Anemia, unspecified: Secondary | ICD-10-CM

## 2011-05-06 DIAGNOSIS — D508 Other iron deficiency anemias: Secondary | ICD-10-CM

## 2011-05-06 DIAGNOSIS — I2510 Atherosclerotic heart disease of native coronary artery without angina pectoris: Secondary | ICD-10-CM

## 2011-05-06 DIAGNOSIS — J9611 Chronic respiratory failure with hypoxia: Secondary | ICD-10-CM

## 2011-05-06 DIAGNOSIS — I10 Essential (primary) hypertension: Secondary | ICD-10-CM

## 2011-05-06 DIAGNOSIS — K2970 Gastritis, unspecified, without bleeding: Secondary | ICD-10-CM | POA: Diagnosis present

## 2011-05-06 DIAGNOSIS — R079 Chest pain, unspecified: Secondary | ICD-10-CM | POA: Diagnosis present

## 2011-05-06 DIAGNOSIS — K2990 Gastroduodenitis, unspecified, without bleeding: Secondary | ICD-10-CM | POA: Diagnosis present

## 2011-05-06 DIAGNOSIS — R0602 Shortness of breath: Secondary | ICD-10-CM | POA: Diagnosis present

## 2011-05-06 DIAGNOSIS — E1149 Type 2 diabetes mellitus with other diabetic neurological complication: Secondary | ICD-10-CM

## 2011-05-06 DIAGNOSIS — J9610 Chronic respiratory failure, unspecified whether with hypoxia or hypercapnia: Secondary | ICD-10-CM

## 2011-05-06 LAB — ABO/RH: ABO/RH(D): O POS

## 2011-05-06 LAB — GLUCOSE, CAPILLARY: Glucose-Capillary: 148 mg/dL — ABNORMAL HIGH (ref 70–99)

## 2011-05-06 NOTE — Progress Notes (Signed)
Subjective:    Patient ID: [facility]ws, female    DOB: 24-May-1932, 77 y.o.   MRN: [id number]  HPI Megan [facility] presents for admission H&P and orders. She has had progressive anemia: since November Hgb has dropped from 10.5 gr to 8.6 August 24 to 8.2 g August 28. She has been symptomatic with increased SOB/DOE and increased angina. Due to her multiple comorbidities and age she is admitted for transfusion and further evaluation.  Past Medical History  Diagnosis Date  . COPD (chronic obstructive pulmonary disease)   . OSA (obstructive sleep apnea)   . Hypoxemia   . Secondary pulmonary hypertension   . CAD (coronary artery disease)   . Diabetes mellitus type II   . Hyperlipidemia   . Hypertension   . Peripheral vascular disease   . Tobacco abuse   . Constipation    Past Surgical History  Procedure Date  . Carotid endarterectomy     left, right   . Total abdominal hysterectomy   . Aaa stent   . Bilateral iliac stenting   . Resection of vocal chord lesions   . Excision serous cyst adenofibroma    No family history on file. History   Social History  . Marital Status: Widowed    Spouse Name: N/A    Number of Children: N/A  . Years of Education: N/A   Occupational History  . Not on file.   Social History Main Topics  . Smoking status: Former Smoker    Types: Cigarettes    Quit date: 09/06/1996  . Smokeless tobacco: Never Used  . Alcohol Use: No  . Drug Use: No  . Sexually Active: No   Other Topics Concern  . Not on file   Social History Narrative   Widowed. Daughters are very supportive - which allows her to live alone. End-of-Life care: discussed living will, HCPOA and MOST form. All are provided to the patient for her to consider with the help of her family. Out of Facilty Order and blank MOST form signed. (07/22/10)   Current Outpatient Prescriptions on File Prior to Visit  Medication Sig Dispense Refill  . albuterol (PROAIR HFA) 108 (90 BASE) MCG/ACT  inhaler 2 puffs up to 4 times a day as needed       . albuterol (PROVENTIL) (2.5 MG/3ML) 0.083% nebulizer solution Take 2.5 mg by nebulization every 6 (six) hours as needed.       . ALPRAZolam (XANAX) 0.25 MG tablet Take 1 tablet (0.25 mg total) by mouth 2 (two) times daily as needed for anxiety.  62 tablet  5  . amitriptyline (ELAVIL) 25 MG tablet TAKE 1 TABLET BY MOUTH AT BEDTIME FOR PERIPHERAL NEUROPATHY. MAY INCREASE TO 2 TABLETS IF NEEDED  270 tablet  0  . amLODipine (NORVASC) 5 MG tablet Take 5 mg by mouth daily.        Marland Kitchen aspirin 325 MG tablet Take 325 mg by mouth daily.        . Calcium Carbonate-Vitamin D (CALCIUM 600+D) 600-400 MG-UNIT per tablet Take 1 tablet by mouth daily.        Marland Kitchen FLUoxetine (PROZAC) 20 MG capsule TAKE 1 CAPSULE BY MOUTH DAILY  90 capsule  0  . Fluticasone-Salmeterol (ADVAIR DISKUS) 250-50 MCG/DOSE AEPB Inhale 1 puff into the lungs every 12 (twelve) hours.        . furosemide (LASIX) 40 MG tablet Take 40 mg by mouth daily.        Marland Kitchen glimepiride (AMARYL) 2  MG tablet TAKE 1 TABLET BY MOUTH EVERY DAY  90 tablet  0  . guaifenesin (MUCUS RELIEF) 400 MG TABS 2 tablets every 12 hours as needed       . isosorbide mononitrate (IMDUR) 60 MG 24 hr tablet 1.5 tablets daily       . metFORMIN (GLUCOPHAGE) 500 MG tablet TAKE 1 TABLET TWICE DAILY WITH FOOD  180 tablet  0  . metoprolol (TOPROL-XL) 50 MG 24 hr tablet TAKE 1 TABLET BY MOUTH ONCE DAILY  30 tablet  0  . Multiple Vitamins-Minerals (MULTIVITAL PO) Once a day       . nitroGLYCERIN (NITROSTAT) 0.4 MG SL tablet Place 0.4 mg under the tongue every 5 (five) minutes as needed.        Marland Kitchen omeprazole (PRILOSEC) 40 MG capsule Take 20 mg by mouth 2 (two) times daily.       Marland Kitchen oxybutynin (DITROPAN-XL) 10 MG 24 hr tablet Take 10 mg by mouth daily.        Marland Kitchen PENTOXIFYLLINE PO 1 tablet 3 times a day       . PLAVIX 75 MG tablet TAKE 1 TABLET BY MOUTH DAILY  90 tablet  0  . potassium chloride (MICRO-K) 10 MEQ CR capsule TAKE 1 TABLET BY MOUTH  TWICE DAILY OR 2 TABLETS BY MOUTH ONCE DAILY  60 capsule  0  . SENNOSIDES-DOCUSATE SODIUM PO Once a day       . simvastatin (ZOCOR) 80 MG tablet TAKE 1 TABLET BY MOUTH EVERY EVENING  30 tablet  0      Review of Systems Review of Systems  Constitutional:  Negative for fever, chills, activity change and unexpected weight change.  HEENT:  Negative for hearing loss, ear pain, congestion, neck stiffness and postnasal drip. Negative for sore throat or swallowing problems. Negative for dental complaints.   Eyes: Negative for vision loss or change in visual acuity.  Respiratory: Positive for chest tightness and wheezing.   Cardiovascular: Positive for chest pain and palpitation.  decreased exercise tolerance Gastrointestinal: No change in bowel habit. No bloating or gas. Positive reflux and indigestion Genitourinary: Negative for urgency, frequency, flank pain and difficulty urinating.  Musculoskeletal: Negative for myalgias, back pain, arthralgias and gait problem.  Neurological: Negative for dizziness, tremors, weakness and headaches.  Hematological: Negative for adenopathy.  Psychiatric/Behavioral: Negative for behavioral problems and dysphoric mood.       Objective:   Physical Exam Vitals reviewed - BP stable  Gen'l - obese elderly white woman in no acute distress HEENT- EAC/TMs normal, oropharynx with full dentures, posterior pharynx clear Neck -  supple, no thyromegaly Nodes - negative in cervical, supraclavicular regions Chest - miuld kyphosis, increased AP diameter Lungs - moving air well. No wheezing Cor - 2+ radial pulse, trace DP/PT pulses, RRR but distant, no murmur appreciated Abdomen - obese, BS+ x 4, no HSM, mild tenderness to deep palpation at the epigastrum Rectal - deferred until in hospital Ext - no deformity. Neuro - intact.   Lab Results  Component Value Date   WBC 10.4 04/27/2011   HGB 8.2* 05/04/2011   HCT 25.6* 05/04/2011   PLT 369.0 04/27/2011   CHOL 138  03/24/2010   TRIG 190.0* 03/24/2010   HDL 39.60 03/24/2010   ALT 23 07/27/2010   AST 27 07/27/2010   NA 141 04/27/2011   K 4.7 04/27/2011   CL 107 04/27/2011   CREATININE 0.9 04/27/2011   BUN 14 04/27/2011   CO2 27 04/27/2011  TSH 0.595  07/03/2009   INR 1.25 07/03/2009   HGBA1C 7.9* 05/04/2011   MICROALBUR 5.6* 10/22/2008   Lab Results  Component Value Date   IRON 29* 05/04/2011       Iron saturation        5.9% Lab Results  Component Value Date   RETICCTPCT 1.8 05/04/2011            Assessment & Plan:  ACP - reviewed with patient - she reaffirms that she would not want CPR or mechanical ventilation.

## 2011-05-06 NOTE — Assessment & Plan Note (Signed)
Patient with more angina associated with anemia.  Plan - continue home meds           Telemetry monitoring

## 2011-05-06 NOTE — Assessment & Plan Note (Signed)
Patient followed by Dr. Craige Cotta. She is oxygen dependent. Her anemia has exacerbated her respiratory distress  Plan - continue home meds           Continue oxygen

## 2011-05-06 NOTE — Assessment & Plan Note (Addendum)
Patient with symptomatic anemia. She has not had frank bleeding but her total iron is depressed at 29 with Iron sat 5.9%, retic count is 1.8% with an absolute count of 59.2. She has had GI problems in the past. Last colonoscopy NOv '08 but cannot locate report in EPIC. Her blood loss is most likely GI.  Plan - admit to tele bed            Type and cross for 2 units of blood indicated for Hgb 8.2 and dropping in patient with CAD having more chest pain and COPD with more SOB.            GI consult for possible endoscopic evaluation.            Will give IV iron while in hospital.

## 2011-05-06 NOTE — Telephone Encounter (Signed)
[facility]

## 2011-05-06 NOTE — Assessment & Plan Note (Signed)
BP Readings from Last 3 Encounters:  05/06/11 110/86  05/04/11 122/74  04/27/11 118/70   stable

## 2011-05-06 NOTE — Assessment & Plan Note (Signed)
Lab Results  Component Value Date   HGBA1C 7.9* 05/04/2011   Subopitmal control. With age and fragility will set target of between 7-8%.  Plan - continue home regimen.

## 2011-05-07 DIAGNOSIS — D649 Anemia, unspecified: Secondary | ICD-10-CM

## 2011-05-07 DIAGNOSIS — R079 Chest pain, unspecified: Secondary | ICD-10-CM

## 2011-05-07 DIAGNOSIS — R0602 Shortness of breath: Secondary | ICD-10-CM

## 2011-05-07 LAB — HEMOGLOBIN AND HEMATOCRIT, BLOOD: Hemoglobin: 9.9 g/dL — ABNORMAL LOW (ref 12.0–15.0)

## 2011-05-07 LAB — CROSSMATCH: Unit division: 0

## 2011-05-07 LAB — GLUCOSE, CAPILLARY: Glucose-Capillary: 223 mg/dL — ABNORMAL HIGH (ref 70–99)

## 2011-05-12 ENCOUNTER — Other Ambulatory Visit (INDEPENDENT_AMBULATORY_CARE_PROVIDER_SITE_OTHER): Payer: Medicare Other

## 2011-05-12 ENCOUNTER — Other Ambulatory Visit: Payer: Medicare Other

## 2011-05-12 ENCOUNTER — Other Ambulatory Visit: Payer: Self-pay | Admitting: Internal Medicine

## 2011-05-12 DIAGNOSIS — D649 Anemia, unspecified: Secondary | ICD-10-CM

## 2011-05-12 LAB — HEMOGLOBIN: Hemoglobin: 10 g/dL — ABNORMAL LOW (ref 12.0–15.0)

## 2011-05-23 NOTE — Discharge Summary (Signed)
  NAME:  [facility]ws, Megan Mathews NO.:  [id number]  MEDICAL RECORD NO.:  [id number]  LOCATION:  1406                         FACILITY:  [facility]  PHYSICIAN:  Rosalyn Gess. [place] Mcphee, MD  DATE OF BIRTH:  1932-06-18  DATE OF ADMISSION:  05/06/2011 DATE OF DISCHARGE:  05/07/2011                              DISCHARGE SUMMARY   ADMITTING DIAGNOSES:  Anemia, symptomatic with increased shortness of breath, and mild chest pain.  DISCHARGE DIAGNOSIS:  Anemia improved after transfusion.  CONSULTANT:  Dr. Gerilyn Pilgrim for Dr. Corinda Gubler, GI.  PROCEDURE:  The patient had esophagogastroduodenoscopy that showed mild gastritis with no other lesions.  HISTORY OF PRESENT ILLNESS:  Megan Mathews had been seen by Dr. Coralyn Helling, and was found to have a drop in hemoglobin from 10.5 g to 8.6 g on April 30, 2011, which represented a drop.  Now significant over 41-month period of time.  The patient had repeat H and H on May 04, 2011, witha further drop in hemoglobin to 8.2 g.  The patient was complaining of increasing shortness of breath and dyspnea on exertion and mild increased angina.  She subsequently was admitted for transfusion and GI evaluation.  Please see the EPIC generated H and P.  HOSPITAL COURSE:  The patient was admitted to the telemetry unit, given her multiple comorbidities.  She was typed, crossed-matched, and transfused 2 units of blood.  Followup lab revealed a hemoglobin of 9.9 on the morning of the discharge day.  The patient did have EGD performed by Dr. Gerilyn Pilgrim which revealed the patient to have mild distal gastritis and otherwise normal examination.  The patient at the time of discharge examination was feeling better, her color was better.  She was having no significant shortness of breath aside from her baseline and had no complaints of chest pain.  DISCHARGE PHYSICAL EXAMINATION:  VITAL SIGNS:  Temperature 98.7, blood pressure 143/55, heart rate 64, respirations 18, O2 sats 100%  on 3 liters, capillary blood glucose 154.  I's and O's were negative at 760. GENERAL APPEARANCE:  This is a chronically ill-appearing overweight woman sitting in a chair who is in no acute distress.  Her color is improved from the day of admission. RESPIRATORY:  The patient with no increased work of breathing.  No audible wheezing is noted. CARDIOVASCULAR:  The patient's heart rate is regular.  No further examination conducted.  DISPOSITION:  The patient is discharged to home.  She will continue on her home medications as prior to admission.  She will be coming to the office on Wednesday, May 12, 2011, for followup H and H.  The patient's condition at the time of discharge dictation is stable, but guarded given her multiple comorbidities.     Rosalyn Gess Rush Salce, MD     MEN/MEDQ  D:  05/07/2011  T:  05/08/2011  Job:  [id number]  Electronically Signed by Illene Regulus MD on 05/23/2011 10:50:36 PM

## 2011-05-25 ENCOUNTER — Other Ambulatory Visit: Payer: Self-pay | Admitting: Internal Medicine

## 2011-05-26 ENCOUNTER — Other Ambulatory Visit: Payer: Self-pay | Admitting: Internal Medicine

## 2011-05-27 ENCOUNTER — Other Ambulatory Visit: Payer: Self-pay | Admitting: Internal Medicine

## 2011-06-02 ENCOUNTER — Other Ambulatory Visit: Payer: Self-pay | Admitting: Internal Medicine

## 2011-06-13 ENCOUNTER — Other Ambulatory Visit: Payer: Self-pay | Admitting: Internal Medicine

## 2011-06-15 LAB — COMPREHENSIVE METABOLIC PANEL
ALT: 14
Albumin: 2.8 — ABNORMAL LOW
Albumin: 3 — ABNORMAL LOW
Alkaline Phosphatase: 54
BUN: 10
Chloride: 101
Chloride: 102
Creatinine, Ser: 0.77
Glucose, Bld: 122 — ABNORMAL HIGH
Potassium: 4.3
Sodium: 134 — ABNORMAL LOW
Total Bilirubin: 0.8
Total Protein: 6.5

## 2011-06-15 LAB — URINALYSIS, ROUTINE W REFLEX MICROSCOPIC
Bilirubin Urine: NEGATIVE
Glucose, UA: NEGATIVE
Hgb urine dipstick: NEGATIVE
Ketones, ur: 15 — AB
Nitrite: NEGATIVE
Specific Gravity, Urine: 1.024
pH: 6

## 2011-06-15 LAB — CBC
HCT: 28.8 — ABNORMAL LOW
HCT: 29.7 — ABNORMAL LOW
HCT: 31.8 — ABNORMAL LOW
Hemoglobin: 10.5 — ABNORMAL LOW
Hemoglobin: 9.6 — ABNORMAL LOW
MCHC: 33
MCHC: 33.3
Platelets: 296
Platelets: 310
Platelets: 318
RBC: 3.55 — ABNORMAL LOW
RDW: 14.1
RDW: 14.3
WBC: 11.3 — ABNORMAL HIGH

## 2011-06-15 LAB — CULTURE, BLOOD (ROUTINE X 2): Culture: NO GROWTH

## 2011-06-15 LAB — LIPID PANEL
Cholesterol: 115
LDL Cholesterol: 53
Triglycerides: 69
VLDL: 14

## 2011-06-15 LAB — URINE CULTURE
Colony Count: 40000
Special Requests: NEGATIVE

## 2011-06-15 LAB — DIFFERENTIAL
Basophils Absolute: 0
Basophils Absolute: 0.1
Basophils Relative: 1
Basophils Relative: 2 — ABNORMAL HIGH
Eosinophils Absolute: 0 — ABNORMAL LOW
Eosinophils Relative: 0
Eosinophils Relative: 1
Lymphocytes Relative: 9 — ABNORMAL LOW
Monocytes Absolute: 1
Monocytes Absolute: 1.1 — ABNORMAL HIGH
Monocytes Absolute: 1.2 — ABNORMAL HIGH
Monocytes Relative: 6
Neutro Abs: 10 — ABNORMAL HIGH
Neutro Abs: 12.2 — ABNORMAL HIGH

## 2011-06-15 LAB — CK TOTAL AND CKMB (NOT AT ARMC)
CK, MB: 0.8
CK, MB: 0.8
Relative Index: INVALID
Total CK: 56
Total CK: 70

## 2011-06-15 LAB — PROTIME-INR
INR: 1.2
Prothrombin Time: 15.4 — ABNORMAL HIGH

## 2011-06-15 LAB — BASIC METABOLIC PANEL
BUN: 12
BUN: 8
CO2: 25
Calcium: 8.8
Calcium: 8.9
Creatinine, Ser: 0.74
GFR calc non Af Amer: >60
Glucose, Bld: 110 — ABNORMAL HIGH
Sodium: 140

## 2011-06-15 LAB — TSH: TSH: 0.559

## 2011-06-15 LAB — TROPONIN I: Troponin I: 0.03

## 2011-06-24 ENCOUNTER — Other Ambulatory Visit: Payer: Self-pay | Admitting: Internal Medicine

## 2011-07-09 ENCOUNTER — Other Ambulatory Visit: Payer: Self-pay | Admitting: Internal Medicine

## 2011-07-17 ENCOUNTER — Other Ambulatory Visit: Payer: Self-pay | Admitting: Internal Medicine

## 2011-08-23 ENCOUNTER — Other Ambulatory Visit: Payer: Self-pay | Admitting: Internal Medicine

## 2011-09-20 ENCOUNTER — Telehealth: Payer: Self-pay | Admitting: Internal Medicine

## 2011-09-20 DIAGNOSIS — D649 Anemia, unspecified: Secondary | ICD-10-CM

## 2011-09-20 NOTE — Telephone Encounter (Signed)
The pt's daughter called and stated she thinks her mother needs to come in for a hemoglobin check.  She states she is pale.  Does she need to make an appt? Or can these lab orders be entered?  Also, she asked if this was a fasting lab. Thanks!

## 2011-09-23 ENCOUNTER — Telehealth: Payer: Self-pay | Admitting: *Deleted

## 2011-09-23 MED ORDER — CLOPIDOGREL BISULFATE 75 MG PO TABS: 75.0000 mg | ORAL_TABLET | Freq: Every day | ORAL | Status: DC

## 2011-09-23 NOTE — Telephone Encounter (Signed)
Refill request plavix 75mg .

## 2011-09-24 ENCOUNTER — Telehealth: Payer: Self-pay | Admitting: *Deleted

## 2011-09-24 MED ORDER — GLIMEPIRIDE 2 MG PO TABS: 2.0000 mg | ORAL_TABLET | Freq: Every day | ORAL | Status: DC

## 2011-09-24 NOTE — Telephone Encounter (Signed)
Refill request glimepiride 2mg .

## 2011-09-27 NOTE — Telephone Encounter (Signed)
Pt would like her hemoglobin checked. Please advise.

## 2011-09-27 NOTE — Telephone Encounter (Signed)
May come in for CBC dx 285.9

## 2011-09-28 ENCOUNTER — Telehealth: Payer: Self-pay | Admitting: *Deleted

## 2011-09-28 MED ORDER — METFORMIN HCL 500 MG PO TABS: 500.0000 mg | ORAL_TABLET | Freq: Two times a day (BID) | ORAL | Status: DC

## 2011-09-28 NOTE — Telephone Encounter (Signed)
Refill request metformin 500 mg  

## 2011-09-29 ENCOUNTER — Other Ambulatory Visit (INDEPENDENT_AMBULATORY_CARE_PROVIDER_SITE_OTHER): Payer: Medicare Other

## 2011-09-29 DIAGNOSIS — D649 Anemia, unspecified: Secondary | ICD-10-CM

## 2011-09-29 LAB — CBC
Hemoglobin: 11 g/dL — ABNORMAL LOW (ref 12.0–15.0)
Platelets: 291 10*3/uL (ref 150.0–400.0)
RBC: 3.47 Mil/uL — ABNORMAL LOW (ref 3.87–5.11)
RDW: 13.2 % (ref 11.5–14.6)
WBC: 8.7 10*3/uL (ref 4.5–10.5)

## 2011-09-29 NOTE — Telephone Encounter (Signed)
Notified pt & entered lab orders.

## 2011-10-07 ENCOUNTER — Telehealth: Payer: Self-pay

## 2011-10-07 MED ORDER — AMLODIPINE BESYLATE 5 MG PO TABS: 5.0000 mg | ORAL_TABLET | Freq: Every day | ORAL | Status: DC

## 2011-10-07 NOTE — Telephone Encounter (Signed)
Fax refill request from pharmacy

## 2011-10-18 ENCOUNTER — Other Ambulatory Visit: Payer: Self-pay | Admitting: *Deleted

## 2011-10-18 ENCOUNTER — Other Ambulatory Visit: Payer: Self-pay

## 2011-10-18 MED ORDER — AMITRIPTYLINE HCL 25 MG PO TABS: 25.0000 mg | ORAL_TABLET | Freq: Every day | ORAL | Status: DC

## 2011-10-18 MED ORDER — FLUOXETINE HCL 20 MG PO CAPS: 20.0000 mg | ORAL_CAPSULE | Freq: Every day | ORAL | Status: DC

## 2011-10-18 NOTE — Telephone Encounter (Signed)
Done

## 2011-10-18 NOTE — Telephone Encounter (Signed)
Ok for refill x 11 

## 2011-10-18 NOTE — Telephone Encounter (Signed)
Fluoxetine request [last refill 11.10.2012 #[id number]

## 2011-10-21 ENCOUNTER — Telehealth: Payer: Self-pay

## 2011-10-21 NOTE — Telephone Encounter (Signed)
Received fax request from [facility] for alprazolam 0.25 1 bid and Nystop 100,000 top powder . RX last filled 07/04/2011 and pt last seen 05/06/11. Please advise ok

## 2011-10-21 NOTE — Telephone Encounter (Signed)
Ok for refill? 

## 2011-10-22 MED ORDER — ALPRAZOLAM 0.25 MG PO TABS: 0.2500 mg | ORAL_TABLET | Freq: Two times a day (BID) | ORAL | Status: DC | PRN

## 2011-10-22 MED ORDER — NYSTATIN 100000 UNIT/GM EX POWD: CUTANEOUS | Status: DC

## 2011-10-22 NOTE — Telephone Encounter (Signed)
Rx for Alprazolam called into pharmacy. Rx for Nystatin powder sent to pharmacy.

## 2011-10-27 ENCOUNTER — Other Ambulatory Visit: Payer: Self-pay | Admitting: *Deleted

## 2011-10-27 MED ORDER — PENTOXIFYLLINE ER 400 MG PO TBCR: 400.0000 mg | EXTENDED_RELEASE_TABLET | Freq: Three times a day (TID) | ORAL | Status: DC

## 2011-10-27 NOTE — Telephone Encounter (Signed)
R'cd fax from [facility] for refill of Pentoxifylline

## 2011-11-05 ENCOUNTER — Other Ambulatory Visit: Payer: Self-pay | Admitting: *Deleted

## 2011-11-05 MED ORDER — OMEPRAZOLE 20 MG PO CPDR: 20.0000 mg | DELAYED_RELEASE_CAPSULE | Freq: Two times a day (BID) | ORAL | Status: DC

## 2011-11-23 ENCOUNTER — Other Ambulatory Visit: Payer: Self-pay

## 2011-11-23 MED ORDER — POTASSIUM CHLORIDE ER 10 MEQ PO CPCR: 10.0000 meq | ORAL_CAPSULE | Freq: Two times a day (BID) | ORAL | Status: DC

## 2011-11-23 MED ORDER — SIMVASTATIN 80 MG PO TABS: 80.0000 mg | ORAL_TABLET | Freq: Every day | ORAL | Status: DC

## 2011-11-30 ENCOUNTER — Encounter: Payer: Self-pay | Admitting: Pulmonary Disease

## 2011-11-30 ENCOUNTER — Ambulatory Visit (INDEPENDENT_AMBULATORY_CARE_PROVIDER_SITE_OTHER): Payer: Medicare Other | Admitting: Pulmonary Disease

## 2011-11-30 VITALS — BP 124/68 | HR 65 | Temp 97.6°F | Ht 61.0 in | Wt 199.6 lb

## 2011-11-30 DIAGNOSIS — R0902 Hypoxemia: Secondary | ICD-10-CM

## 2011-11-30 DIAGNOSIS — G4733 Obstructive sleep apnea (adult) (pediatric): Secondary | ICD-10-CM

## 2011-11-30 DIAGNOSIS — J9610 Chronic respiratory failure, unspecified whether with hypoxia or hypercapnia: Secondary | ICD-10-CM

## 2011-11-30 DIAGNOSIS — J9611 Chronic respiratory failure with hypoxia: Secondary | ICD-10-CM

## 2011-11-30 DIAGNOSIS — J449 Chronic obstructive pulmonary disease, unspecified: Secondary | ICD-10-CM

## 2011-11-30 MED ORDER — ALBUTEROL SULFATE HFA 108 (90 BASE) MCG/ACT IN AERS: 2.0000 | INHALATION_SPRAY | RESPIRATORY_TRACT | Status: AC | PRN

## 2011-11-30 MED ORDER — ALBUTEROL SULFATE (2.5 MG/3ML) 0.083% IN NEBU: 2.5000 mg | INHALATION_SOLUTION | Freq: Four times a day (QID) | RESPIRATORY_TRACT | Status: DC | PRN

## 2011-11-30 MED ORDER — FLUTICASONE-SALMETEROL 250-50 MCG/DOSE IN AEPB: INHALATION_SPRAY | RESPIRATORY_TRACT | Status: AC

## 2011-11-30 NOTE — Assessment & Plan Note (Signed)
She is compliant and reports benefit from BPAP therapy.     

## 2011-11-30 NOTE — Assessment & Plan Note (Signed)
She maintained her oxygen saturation with exertion using 3 liters.

## 2011-11-30 NOTE — Progress Notes (Signed)
Chief Complaint  Patient presents with  . Follow-up    Pt states her breathing has been doing fine. has very little dry cough. denies any wheezing, chest tightness. Pt states she wears her bpap machine everynight and is having no problems with it    History of Present Illness: Megan Mathews is a 78 y.o. female former smoker with COPD, OSA using BPAP 10/7 and chronic hypoxic respiratory failure using 3 L O2.  She has been doing well with her breathing.  She does not have much cough, wheeze, or chest congestion.  She is not having leg swelling.  She uses her albuterol twice per day.  She does this because she thought she had to use it this often.  She uses her oxygen 24/7.  She is doing well with BPAP.  Past Medical History  Diagnosis Date  . COPD (chronic obstructive pulmonary disease)   . OSA (obstructive sleep apnea)   . Hypoxemia   . Secondary pulmonary hypertension   . CAD (coronary artery disease)   . Diabetes mellitus type II   . Hyperlipidemia   . Hypertension   . Peripheral vascular disease   . Tobacco abuse   . Constipation     Past Surgical History  Procedure Date  . Carotid endarterectomy     left, right   . Total abdominal hysterectomy   . Aaa stent   . Bilateral iliac stenting   . Resection of vocal chord lesions   . Excision serous cyst adenofibroma     Allergies  Allergen Reactions  . Iohexol      Code: FEVER, Desc: per pt: rash fever unable to stand     Physical Exam:  Blood pressure 124/68, pulse 65, temperature 97.6 F (36.4 C), temperature source Oral, height 5\' 1"  (1.549 m), weight 199 lb 9.6 oz (90.538 kg), SpO2 99.00%. Body mass index is 37.71 kg/(m^2). Wt Readings from Last 2 Encounters:  11/30/11 199 lb 9.6 oz (90.538 kg)  05/06/11 197 lb (89.359 kg)    General - obese, wearing oxygen  HEENT - no sinus tenderness, no oral exudate, wearing dentures, no LAN  Cardiac - s1s2 no murmur  Chest - prolonged exhalation, no wheeze/rales  Abd  - soft, nontender  Ext - no edema  Neuro - decreased hearing, normal strength   Assessment/Plan:  Outpatient Encounter Prescriptions as of 11/30/2011  Medication Sig Dispense Refill  . ADVAIR DISKUS 250-50 MCG/DOSE AEPB USE ONE INHALATION BY MOUTH TWICE DAILY  180 each  3  . albuterol (PROAIR HFA) 108 (90 BASE) MCG/ACT inhaler 2 puffs up to 4 times a day as needed       . albuterol (PROVENTIL) (2.5 MG/3ML) 0.083% nebulizer solution Take 2.5 mg by nebulization every 6 (six) hours as needed.       . ALPRAZolam (XANAX) 0.25 MG tablet Take 1 tablet (0.25 mg total) by mouth 2 (two) times daily as needed for anxiety.  60 tablet  5  . amitriptyline (ELAVIL) 25 MG tablet Take 1 tablet (25 mg total) by mouth at bedtime.  270 tablet  3  . amLODipine (NORVASC) 5 MG tablet Take 1 tablet (5 mg total) by mouth daily.  90 tablet  1  . aspirin 325 MG tablet Take 325 mg by mouth daily.        . Calcium Carbonate-Vitamin D (CALCIUM 600+D) 600-400 MG-UNIT per tablet Take 1 tablet by mouth daily.        . clopidogrel (PLAVIX) 75 MG tablet  Take 1 tablet (75 mg total) by mouth daily.  90 tablet  0  . FLUoxetine (PROZAC) 20 MG capsule Take 1 capsule (20 mg total) by mouth daily.  30 capsule  11  . furosemide (LASIX) 40 MG tablet Take 40 mg by mouth daily.        Marland Kitchen glimepiride (AMARYL) 2 MG tablet Take 1 tablet (2 mg total) by mouth daily.  90 tablet  0  . guaifenesin (MUCUS RELIEF) 400 MG TABS 2 tablets every 12 hours as needed       . isosorbide mononitrate (IMDUR) 60 MG 24 hr tablet 1.5 tablets daily       . metFORMIN (GLUCOPHAGE) 500 MG tablet Take 1 tablet (500 mg total) by mouth 2 (two) times daily with a meal.  180 tablet  0  . metoprolol (TOPROL-XL) 50 MG 24 hr tablet TAKE 1 TABLET BY MOUTH ONCE DAILY  90 tablet  0  . Multiple Vitamins-Minerals (MULTIVITAL PO) Once a day       . nitroGLYCERIN (NITROSTAT) 0.4 MG SL tablet Place 0.4 mg under the tongue every 5 (five) minutes as needed.        Marland Kitchen omeprazole  (PRILOSEC) 20 MG capsule Take 1 capsule (20 mg total) by mouth 2 (two) times daily.  60 capsule  5  . oxybutynin (DITROPAN-XL) 10 MG 24 hr tablet TAKE 1 TABLET BY MOUTH EVERY DAY  90 tablet  2  . pentoxifylline (TRENTAL) 400 MG CR tablet Take 1 tablet (400 mg total) by mouth 3 (three) times daily.  270 tablet  1  . potassium chloride (MICRO-K) 10 MEQ CR capsule Take 1 capsule (10 mEq total) by mouth 2 (two) times daily.  180 capsule  1  . SENNOSIDES-DOCUSATE SODIUM PO Once a day       . simvastatin (ZOCOR) 80 MG tablet Take 1 tablet (80 mg total) by mouth at bedtime.  90 tablet  1  . DISCONTD: nystatin (MYCOSTATIN) powder Apply to affected areas as needed twice daily  30 g  0  . DISCONTD: omeprazole (PRILOSEC) 40 MG capsule Take 20 mg by mouth 2 (two) times daily.         Corderro Koloski Pager:  [phone] 11/30/2011, 9:14 AM

## 2011-11-30 NOTE — Assessment & Plan Note (Signed)
She has minimal symptoms.  Will see if she can tolerate further decrease in her inhaler regimen.  Will have her use advair one puff daily for 2 weeks, and if not difference she can then stop advair.  She is to continue albuterol prn.  I have explained that she only needs to use albuterol when she has trouble.

## 2011-11-30 NOTE — Patient Instructions (Signed)
Advair one puff daily for two weeks, and if okay then stop advair Albuterol one vial nebulized up to four times per day as needed for cough, wheeze, or chest congestion Proair two puffs as needed for cough, wheeze, or chest congestion Call if breathing gets worse after stopping advair Follow up in 6 months

## 2011-12-01 ENCOUNTER — Other Ambulatory Visit: Payer: Self-pay

## 2011-12-01 MED ORDER — FUROSEMIDE 40 MG PO TABS: 40.0000 mg | ORAL_TABLET | Freq: Every day | ORAL | Status: DC

## 2011-12-02 ENCOUNTER — Other Ambulatory Visit: Payer: Self-pay

## 2011-12-02 MED ORDER — METOPROLOL SUCCINATE ER 50 MG PO TB24: 50.0000 mg | ORAL_TABLET | Freq: Every day | ORAL | Status: DC

## 2011-12-24 ENCOUNTER — Telehealth: Payer: Self-pay

## 2011-12-24 MED ORDER — GLIMEPIRIDE 2 MG PO TABS: 2.0000 mg | ORAL_TABLET | Freq: Every day | ORAL | Status: DC

## 2011-12-24 NOTE — Telephone Encounter (Signed)
Rx refill request recieved

## 2012-01-04 ENCOUNTER — Other Ambulatory Visit: Payer: Self-pay

## 2012-01-04 MED ORDER — CLOPIDOGREL BISULFATE 75 MG PO TABS: 75.0000 mg | ORAL_TABLET | Freq: Every day | ORAL | Status: DC

## 2012-01-06 ENCOUNTER — Other Ambulatory Visit: Payer: Self-pay | Admitting: *Deleted

## 2012-01-06 MED ORDER — METFORMIN HCL 500 MG PO TABS: 500.0000 mg | ORAL_TABLET | Freq: Two times a day (BID) | ORAL | Status: DC

## 2012-02-29 ENCOUNTER — Other Ambulatory Visit: Payer: Self-pay

## 2012-02-29 MED ORDER — METOPROLOL SUCCINATE ER 50 MG PO TB24: 50.0000 mg | ORAL_TABLET | Freq: Every day | ORAL | Status: DC

## 2012-03-07 ENCOUNTER — Other Ambulatory Visit: Payer: Self-pay | Admitting: *Deleted

## 2012-03-07 MED ORDER — FUROSEMIDE 40 MG PO TABS: 40.0000 mg | ORAL_TABLET | Freq: Every day | ORAL | Status: DC

## 2012-03-07 NOTE — Telephone Encounter (Signed)
Refill Rx furosemide to [facility]

## 2012-03-27 ENCOUNTER — Other Ambulatory Visit: Payer: Self-pay | Admitting: *Deleted

## 2012-03-28 MED ORDER — GLIMEPIRIDE 2 MG PO TABS: 2.0000 mg | ORAL_TABLET | Freq: Every day | ORAL | Status: DC

## 2012-03-28 NOTE — Telephone Encounter (Signed)
[facility], rx refill glimepriride

## 2012-03-31 ENCOUNTER — Other Ambulatory Visit: Payer: Self-pay | Admitting: *Deleted

## 2012-04-03 ENCOUNTER — Other Ambulatory Visit: Payer: Self-pay | Admitting: *Deleted

## 2012-04-03 MED ORDER — AMLODIPINE BESYLATE 5 MG PO TABS: 5.0000 mg | ORAL_TABLET | Freq: Every day | ORAL | Status: DC

## 2012-04-05 ENCOUNTER — Other Ambulatory Visit: Payer: Self-pay

## 2012-04-05 MED ORDER — OXYBUTYNIN CHLORIDE ER 10 MG PO TB24: 10.0000 mg | ORAL_TABLET | Freq: Every day | ORAL | Status: DC

## 2012-04-05 MED ORDER — NYSTATIN 100000 UNIT/GM EX POWD: CUTANEOUS | Status: DC

## 2012-04-05 MED ORDER — CLOPIDOGREL BISULFATE 75 MG PO TABS: 75.0000 mg | ORAL_TABLET | Freq: Every day | ORAL | Status: DC

## 2012-04-05 NOTE — Telephone Encounter (Signed)
Prescription has been sent to pharmacy.

## 2012-04-05 NOTE — Telephone Encounter (Signed)
Ok to refill prn. Add to med list

## 2012-04-05 NOTE — Telephone Encounter (Signed)
Pharmacy requesting refill on Nystatin 100,000 U/GM top pow 30 gm. Prescription not on current medication list. Please advise

## 2012-04-24 ENCOUNTER — Encounter (HOSPITAL_COMMUNITY): Payer: Self-pay

## 2012-04-24 ENCOUNTER — Emergency Department (HOSPITAL_COMMUNITY)
Admission: EM | Admit: 2012-04-24 | Discharge: 2012-04-25 | Disposition: A | Discharge status: Home / Self Care | Payer: Medicare Other | Attending: Emergency Medicine | Admitting: Emergency Medicine

## 2012-04-24 DIAGNOSIS — R040 Epistaxis: Secondary | ICD-10-CM | POA: Insufficient documentation

## 2012-04-24 DIAGNOSIS — E119 Type 2 diabetes mellitus without complications: Secondary | ICD-10-CM | POA: Insufficient documentation

## 2012-04-24 DIAGNOSIS — I10 Essential (primary) hypertension: Secondary | ICD-10-CM | POA: Insufficient documentation

## 2012-04-24 DIAGNOSIS — G4733 Obstructive sleep apnea (adult) (pediatric): Secondary | ICD-10-CM | POA: Insufficient documentation

## 2012-04-24 DIAGNOSIS — Z87891 Personal history of nicotine dependence: Secondary | ICD-10-CM | POA: Insufficient documentation

## 2012-04-24 DIAGNOSIS — I2510 Atherosclerotic heart disease of native coronary artery without angina pectoris: Secondary | ICD-10-CM | POA: Insufficient documentation

## 2012-04-24 DIAGNOSIS — J449 Chronic obstructive pulmonary disease, unspecified: Secondary | ICD-10-CM | POA: Insufficient documentation

## 2012-04-24 DIAGNOSIS — J4489 Other specified chronic obstructive pulmonary disease: Secondary | ICD-10-CM | POA: Insufficient documentation

## 2012-04-24 DIAGNOSIS — E785 Hyperlipidemia, unspecified: Secondary | ICD-10-CM | POA: Insufficient documentation

## 2012-04-24 LAB — CBC
HCT: 29.2 % — ABNORMAL LOW (ref 36.0–46.0)
Hemoglobin: 9.3 g/dL — ABNORMAL LOW (ref 12.0–15.0)
MCH: 28.4 pg (ref 26.0–34.0)
MCHC: 31.8 g/dL (ref 30.0–36.0)
MCV: 89 fL (ref 78.0–100.0)
Platelets: 248 10*3/uL (ref 150–400)
RBC: 3.28 MIL/uL — ABNORMAL LOW (ref 3.87–5.11)
RDW: 13.8 % (ref 11.5–15.5)
WBC: 9.4 10*3/uL (ref 4.0–10.5)

## 2012-04-24 MED ORDER — OXYMETAZOLINE HCL 0.05 % NA SOLN
1.0000 | Freq: Once | NASAL | Status: AC
  Administered 2012-04-25: 1 via NASAL
  Filled 2012-04-24: qty 15

## 2012-04-24 MED ORDER — OXYMETAZOLINE HCL 0.05 % NA SOLN: NASAL | Status: DC

## 2012-04-24 NOTE — ED Provider Notes (Signed)
History     CSN: [id number]  Arrival date & time 04/24/12  2123   First MD Initiated Contact with Patient 04/24/12 2224      Chief Complaint  Patient presents with  . Hypertension  . Epistaxis   HPI:  This is a 78 year old woman with a history of PVD, HTN, and anemia requiring transfusion last year; presenting with hypertension and epistaxis.  She was washing her hands after using the bathroom tonight and her nose started bleeding spontaneously.  Hemostasis was difficult, but finally acheieved in the ambulance with direct pressure.  No packing or pharmacotherapy required.  100-200cc blood loss estimate.  EMS reports BPs of 190/100.  BP 159/44 once here.  Pt denies dyspnea and chest pain.  She did complain of some mild dizziness while bleeding but not now.  She is on ASA and Plavix.  No warfarin.  Past Medical History  Diagnosis Date  . COPD (chronic obstructive pulmonary disease)   . OSA (obstructive sleep apnea)   . Hypoxemia   . Secondary pulmonary hypertension   . CAD (coronary artery disease)   . Diabetes mellitus type II   . Hyperlipidemia   . Hypertension   . Peripheral vascular disease   . Tobacco abuse   . Constipation     Past Surgical History  Procedure Date  . Carotid endarterectomy     left, right   . Total abdominal hysterectomy   . Aaa stent   . Bilateral iliac stenting   . Resection of vocal chord lesions   . Excision serous cyst adenofibroma     History reviewed. No pertinent family history.  History  Substance Use Topics  . Smoking status: Former Smoker    Types: Cigarettes    Quit date: 09/06/1996  . Smokeless tobacco: Never Used  . Alcohol Use: No    OB History    Grav Para Term Preterm Abortions TAB SAB Ect Mult Living                  Review of Systems  All other systems reviewed and are negative.    Allergies  Iohexol  Home Medications   Current Outpatient Rx  Name Route Sig Dispense Refill  . ALBUTEROL SULFATE HFA 108 (90  BASE) MCG/ACT IN AERS Inhalation Inhale 2 puffs into the lungs every 4 (four) hours as needed (Cough, wheeze, or chest congestion). 2 puffs up to 4 times a day as needed 1 Inhaler 5  . ALBUTEROL SULFATE (2.5 MG/3ML) 0.083% IN NEBU Nebulization Take 3 mLs (2.5 mg total) by nebulization every 6 (six) hours as needed. 75 mL 11  . ALPRAZOLAM 0.25 MG PO TABS Oral Take 1 tablet (0.25 mg total) by mouth 2 (two) times daily as needed for anxiety. 60 tablet 5  . AMITRIPTYLINE HCL 25 MG PO TABS Oral Take 1 tablet (25 mg total) by mouth at bedtime. 270 tablet 3  . AMLODIPINE BESYLATE 5 MG PO TABS Oral Take 1 tablet (5 mg total) by mouth daily. Patient needs to make appt with Dr. Debby Bud for further refills. 90 tablet 0  . ASPIRIN 325 MG PO TABS Oral Take 325 mg by mouth daily.      Marland Kitchen CALCIUM CARBONATE-VITAMIN D 600-400 MG-UNIT PO TABS Oral Take 1 tablet by mouth daily.      Marland Kitchen CLOPIDOGREL BISULFATE 75 MG PO TABS Oral Take 1 tablet (75 mg total) by mouth daily. 90 tablet 0  . FLUOXETINE HCL 20 MG PO CAPS  Oral Take 1 capsule (20 mg total) by mouth daily. 30 capsule 11  . FUROSEMIDE 40 MG PO TABS Oral Take 1 tablet (40 mg total) by mouth daily. 30 tablet 3  . GLIMEPIRIDE 2 MG PO TABS Oral Take 1 tablet (2 mg total) by mouth daily. 30 tablet 6  . GUAIFENESIN 400 MG PO TABS  2 tablets every 12 hours as needed     . ISOSORBIDE MONONITRATE ER 60 MG PO TB24 Oral Take 1.5 mg by mouth daily. 1.5 tablets daily    . METFORMIN HCL 500 MG PO TABS Oral Take 1 tablet (500 mg total) by mouth 2 (two) times daily with a meal. 180 tablet 1  . METOPROLOL SUCCINATE ER 50 MG PO TB24 Oral Take 1 tablet (50 mg total) by mouth daily. Take with or immediately following a meal. 90 tablet 0    Need follow up appt soon  . MULTIVITAL PO  Once a day     . NITROGLYCERIN 0.4 MG SL SUBL Sublingual Place 0.4 mg under the tongue every 5 (five) minutes as needed. For chest pain    . OMEPRAZOLE 20 MG PO CPDR Oral Take 1 capsule (20 mg total) by  mouth 2 (two) times daily. 60 capsule 5  . OXYBUTYNIN CHLORIDE ER 10 MG PO TB24 Oral Take 1 tablet (10 mg total) by mouth daily. 90 tablet 1  . PENTOXIFYLLINE ER 400 MG PO TBCR Oral Take 1 tablet (400 mg total) by mouth 3 (three) times daily. 270 tablet 1  . POTASSIUM CHLORIDE ER 10 MEQ PO CPCR Oral Take 1 capsule (10 mEq total) by mouth 2 (two) times daily. 180 capsule 1  . SENNOSIDES-DOCUSATE SODIUM PO  Once a day     . SIMVASTATIN 80 MG PO TABS Oral Take 1 tablet (80 mg total) by mouth at bedtime. 90 tablet 1    BP 150/49  Pulse 74  Temp 98.9 F (37.2 C) (Oral)  Resp 12  SpO2 96%  Physical Exam  Constitutional: She appears well-developed and well-nourished. No distress.  HENT:       Dried blood in right nostril.  No source of bleeding identifiable.  Left nostril normal.  Eyes: Conjunctivae are normal. Pupils are equal, round, and reactive to light.  Cardiovascular: Normal rate, regular rhythm, normal heart sounds and normal pulses.   No murmur heard. Pulmonary/Chest: Effort normal and breath sounds normal.  Skin: Skin is warm, dry and intact.    ED Course  Procedures (including critical care time)   11:44 PM - Pt reassessed.  Hemodynamically stable.  Resting comfortably.   Labs Reviewed  CBC - Abnormal; Notable for the following:    RBC 3.28 (*)     Hemoglobin 9.3 (*)     HCT 29.2 (*)     All other components within normal limits    1. Hypertension   2. Right-sided epistaxis     MDM  1.   Epistaxis:  Probably precipitated by uncontrolled hypertension and propagated by antiplatelet therapy.  Hemostasis now achieved. Hemoglobin was 11 in January; here, it is 9.3.  She is asymptomatic.  1 spray of Afrin administered to right nostril.  Patient and family member instructed on steps to take if epistaxis resumes, including Afrin use. Patient instructed to follow up with PCP to address blood pressure control and with ENT.    Lollie Sails, MD 04/25/12 1610  Lollie Sails, MD 04/25/12 [phone]

## 2012-04-24 NOTE — ED Notes (Signed)
Pt states "I was just sitting in my chair and my nose started bleeding so bad, I couldn't get it to stop." Pt denies any other complaints. No epistaxis noted currently. Pt a&ox4. BP 159/44

## 2012-04-24 NOTE — ED Notes (Signed)
Per ems- pt had sudden onset of nosebleed, beginning at 7pm tonight. 100-200cc blood loss. Pt very hypertensive 190/100 BP  CBG-164 HR-90 NSR SPO2-96% on room air. Epistaxis stopped prior to transport. 20g IV left hand.

## 2012-04-25 ENCOUNTER — Other Ambulatory Visit: Payer: Self-pay | Admitting: *Deleted

## 2012-04-25 ENCOUNTER — Telehealth: Payer: Self-pay | Admitting: Internal Medicine

## 2012-04-25 MED ORDER — PENTOXIFYLLINE ER 400 MG PO TBCR: 400.0000 mg | EXTENDED_RELEASE_TABLET | Freq: Three times a day (TID) | ORAL | Status: DC

## 2012-04-25 NOTE — ED Provider Notes (Signed)
I saw and evaluated the patient, reviewed the resident's note and I agree with the findings and plan.  Epistaxis, now controlled.  No need for packing.  Hgb is low, but I think can be followed as outpt.  No symptoms of severe anemia.  BP is improved here, Afrin in small doses I think is ok, will refer to ENT for follow up.  Likely was anterior bleed and began to combo of HTN, antiplatelet meds.    Gavin Pound. Oletta Lamas, MD 04/25/12 1610

## 2012-04-25 NOTE — Telephone Encounter (Signed)
Ok for Wednesday

## 2012-04-26 NOTE — Telephone Encounter (Signed)
Lm for daughter informing her Wednesday appt is ok

## 2012-04-28 ENCOUNTER — Telehealth: Payer: Self-pay | Admitting: *Deleted

## 2012-04-28 MED ORDER — ALPRAZOLAM 0.25 MG PO TABS: 0.2500 mg | ORAL_TABLET | Freq: Two times a day (BID) | ORAL | Status: DC | PRN

## 2012-04-28 NOTE — Telephone Encounter (Signed)
Refill on medication called to [facility], alprazolam

## 2012-04-28 NOTE — Telephone Encounter (Signed)
Patient Dr, Debby Bud request refill on alprazolam 0.25mg  tablets. Patient has OV on 05/03/2012 with Dr. Debby Bud

## 2012-05-03 ENCOUNTER — Other Ambulatory Visit (INDEPENDENT_AMBULATORY_CARE_PROVIDER_SITE_OTHER): Payer: Medicare Other

## 2012-05-03 ENCOUNTER — Ambulatory Visit (INDEPENDENT_AMBULATORY_CARE_PROVIDER_SITE_OTHER): Payer: Medicare Other | Admitting: Internal Medicine

## 2012-05-03 ENCOUNTER — Encounter: Payer: Self-pay | Admitting: Internal Medicine

## 2012-05-03 VITALS — BP 132/60 | HR 88 | Temp 97.0°F | Resp 14 | Wt 200.0 lb

## 2012-05-03 DIAGNOSIS — E785 Hyperlipidemia, unspecified: Secondary | ICD-10-CM

## 2012-05-03 DIAGNOSIS — D649 Anemia, unspecified: Secondary | ICD-10-CM

## 2012-05-03 DIAGNOSIS — I10 Essential (primary) hypertension: Secondary | ICD-10-CM

## 2012-05-03 DIAGNOSIS — E1142 Type 2 diabetes mellitus with diabetic polyneuropathy: Secondary | ICD-10-CM

## 2012-05-03 DIAGNOSIS — E1149 Type 2 diabetes mellitus with other diabetic neurological complication: Secondary | ICD-10-CM

## 2012-05-03 LAB — CBC WITH DIFFERENTIAL/PLATELET
Basophils Absolute: 0.1 10*3/uL (ref 0.0–0.1)
Eosinophils Absolute: 0.4 10*3/uL (ref 0.0–0.7)
HCT: 31.2 % — ABNORMAL LOW (ref 36.0–46.0)
Hemoglobin: 10 g/dL — ABNORMAL LOW (ref 12.0–15.0)
Lymphs Abs: 2 10*3/uL (ref 0.7–4.0)
MCHC: 32.1 g/dL (ref 30.0–36.0)
MCV: 89.4 fl (ref 78.0–100.0)
Monocytes Absolute: 1 10*3/uL (ref 0.1–1.0)
Neutro Abs: 6.3 10*3/uL (ref 1.4–7.7)
Platelets: 302 10*3/uL (ref 150.0–400.0)
RDW: 14.1 % (ref 11.5–14.6)

## 2012-05-03 LAB — COMPREHENSIVE METABOLIC PANEL
ALT: 18 U/L (ref 0–35)
AST: 26 U/L (ref 0–37)
Alkaline Phosphatase: 57 U/L (ref 39–117)
CO2: 27 mEq/L (ref 19–32)
Creatinine, Ser: 1.1 mg/dL (ref 0.4–1.2)
GFR: 53.84 mL/min — ABNORMAL LOW (ref >60.00)
Sodium: 140 mEq/L (ref 135–145)
Total Bilirubin: 0.3 mg/dL (ref 0.3–1.2)
Total Protein: 7.2 g/dL (ref 6.0–8.3)

## 2012-05-03 LAB — LDL CHOLESTEROL, DIRECT: Direct LDL: 79.7 mg/dL

## 2012-05-03 LAB — LIPID PANEL
HDL: 43.3 mg/dL (ref >39.00)
Triglycerides: 264 mg/dL — ABNORMAL HIGH (ref 0.0–149.0)
VLDL: 52.8 mg/dL — ABNORMAL HIGH (ref 0.0–40.0)

## 2012-05-03 NOTE — Progress Notes (Signed)
  Subjective:    Patient ID: Megan Mathews, female    DOB: Oct 07, 1931, 78 y.o.   MRN: [id number]  HPI    Review of Systems     Objective:   Physical Exam        Assessment & Plan:

## 2012-05-03 NOTE — Progress Notes (Signed)
Subjective:     Patient ID: Megan Mathews, female   DOB: 08-Oct-1931, 78 y.o.   MRN: [id number]  HPI Comments: Megan Mathews is a 78 year old female who has coem to the clinic today for follow-up after she was seen in [facility] ED for a nosebleed. Her nosebleed began after she got up to go to the bathroom while watching wheel of fortune on 04/24/2012. She states that she felt numb in her face prior to the bleed. PT stated that EMS reported a systolic blood pressure over 200 while en route to the hospital. She normally takes her blood pressures at home and states that her systolic blood pressure never rose above 200. [name] reported that her humidifier that she uses with her home oxygen had run out prior to her nosebleed. In the ED she was determined to be stable, and her hemoglobin was 9.3. Today she denies lightheadedness, continued nosebleeds, and diaphoresis. She also expressed concern over the reasons for taking her medications.    Past Medical History  Diagnosis Date  . COPD (chronic obstructive pulmonary disease)   . OSA (obstructive sleep apnea)   . Hypoxemia   . Secondary pulmonary hypertension   . CAD (coronary artery disease)   . Diabetes mellitus type II   . Hyperlipidemia   . Hypertension   . Peripheral vascular disease   . Tobacco abuse   . Constipation   . Anxiety   . Depression    Allergies  Allergen Reactions  . Iohexol      Code: FEVER, Desc: per pt: rash fever unable to stand    Current Outpatient Prescriptions on File Prior to Visit  Medication Sig Dispense Refill  . albuterol (PROAIR HFA) 108 (90 BASE) MCG/ACT inhaler Inhale 2 puffs into the lungs every 4 (four) hours as needed (Cough, wheeze, or chest congestion). 2 puffs up to 4 times a day as needed  1 Inhaler  5  . albuterol (PROVENTIL) (2.5 MG/3ML) 0.083% nebulizer solution Take 3 mLs (2.5 mg total) by nebulization every 6 (six) hours as needed.  75 mL  11  . ALPRAZolam (XANAX) 0.25 MG tablet Take 1  tablet (0.25 mg total) by mouth 2 (two) times daily as needed for anxiety.  60 tablet  1  . amitriptyline (ELAVIL) 25 MG tablet Take 1 tablet (25 mg total) by mouth at bedtime.  270 tablet  3  . amLODipine (NORVASC) 5 MG tablet Take 1 tablet (5 mg total) by mouth daily. Patient needs to make appt with Dr. Debby Bud for further refills.  90 tablet  0  . aspirin 325 MG tablet Take 325 mg by mouth daily.        . Calcium Carbonate-Vitamin D (CALCIUM 600+D) 600-400 MG-UNIT per tablet Take 1 tablet by mouth daily.        . clopidogrel (PLAVIX) 75 MG tablet Take 1 tablet (75 mg total) by mouth daily.  90 tablet  0  . FLUoxetine (PROZAC) 20 MG capsule Take 1 capsule (20 mg total) by mouth daily.  30 capsule  11  . furosemide (LASIX) 40 MG tablet Take 1 tablet (40 mg total) by mouth daily.  30 tablet  3  . glimepiride (AMARYL) 2 MG tablet Take 1 tablet (2 mg total) by mouth daily.  30 tablet  6  . guaifenesin (MUCUS RELIEF) 400 MG TABS 2 tablets every 12 hours as needed       . isosorbide mononitrate (IMDUR) 60 MG 24 hr tablet  Take 1.5 mg by mouth daily. 1.5 tablets daily      . metFORMIN (GLUCOPHAGE) 500 MG tablet Take 1 tablet (500 mg total) by mouth 2 (two) times daily with a meal.  180 tablet  1  . metoprolol succinate (TOPROL-XL) 50 MG 24 hr tablet Take 1 tablet (50 mg total) by mouth daily. Take with or immediately following a meal.  90 tablet  0  . Multiple Vitamins-Minerals (MULTIVITAL PO) Once a day       . nitroGLYCERIN (NITROSTAT) 0.4 MG SL tablet Place 0.4 mg under the tongue every 5 (five) minutes as needed. For chest pain      . omeprazole (PRILOSEC) 20 MG capsule Take 1 capsule (20 mg total) by mouth 2 (two) times daily.  60 capsule  5  . oxybutynin (DITROPAN-XL) 10 MG 24 hr tablet Take 1 tablet (10 mg total) by mouth daily.  90 tablet  1  . oxymetazoline (AFRIN NASAL SPRAY) 0.05 % nasal spray 1 spray into right nostril if bleeding won't stop.  May repeat once.      . pentoxifylline (TRENTAL)  400 MG CR tablet Take 1 tablet (400 mg total) by mouth 3 (three) times daily.  270 tablet  1  . potassium chloride (MICRO-K) 10 MEQ CR capsule Take 1 capsule (10 mEq total) by mouth 2 (two) times daily.  180 capsule  1  . SENNOSIDES-DOCUSATE SODIUM PO Once a day       . simvastatin (ZOCOR) 80 MG tablet Take 1 tablet (80 mg total) by mouth at bedtime.  90 tablet  1    History   Social History  . Marital Status: Widowed    Spouse Name: N/A    Number of Children: N/A  . Years of Education: N/A   Social History Main Topics  . Smoking status: Former Smoker -- 1.0 packs/day for 60 years    Types: Cigarettes    Quit date: 10/08/1997  . Smokeless tobacco: Former Neurosurgeon  . Alcohol Use: No  . Drug Use: No  . Sexually Active: No   Other Topics Concern  . None   Social History Narrative   Widowed. Daughters are very supportive - which allows her to live alone. End-of-Life care: discussed living will, HCPOA and MOST form. All are provided to the patient for her to consider with the help of her family. Out of Facilty Order and blank MOST form signed. (07/22/10)   Reviewed complete history.  Reviewed all medications and provided complete explanations.    Review of Systems  Constitutional: Negative.   HENT: Positive for nosebleeds.   Eyes: Negative.   Gastrointestinal: Negative.   Genitourinary: Negative.  Negative for decreased urine volume.  Musculoskeletal: Negative.   Skin: Negative.   Neurological: Positive for tremors.  Hematological: Negative.   Psychiatric/Behavioral: Negative.        Objective:   Physical Exam  Constitutional: She is oriented to person, place, and time. She appears well-nourished. No distress.  HENT:  Head: Normocephalic.  Nose: Nose normal.  Mouth/Throat: Oropharynx is clear and moist. No oropharyngeal exudate.  Eyes: Pupils are equal, round, and reactive to light. Right eye exhibits no discharge. Left eye exhibits no discharge.  Neck: No tracheal  deviation present. No thyromegaly present.  Cardiovascular: Normal rate, regular rhythm and normal heart sounds.  Exam reveals no gallop and no friction rub.   No murmur heard. Pulmonary/Chest: Breath sounds normal. No stridor. No respiratory distress. She has no wheezes. She has no rales.  She exhibits no tenderness.  Musculoskeletal: She exhibits no edema and no tenderness.  Lymphadenopathy:    She has no cervical adenopathy.  Neurological: She is alert and oriented to person, place, and time.       Uvula appeared deviated to left. Both feet had sensation to pinprick and vibration.  Skin: Skin is warm and dry. No rash noted. She is not diaphoretic. No erythema. No pallor.       No cyanosis noted on hands or feet. No clubbing of hands or feet.  Psychiatric: She has a normal mood and affect. Her behavior is normal. Judgment and thought content normal.       Assessment:     1. Nosebleed This was an isolated nose bleed likely cause by her home oxygen drying out her nasal mucus membranes.  2. Anemia Her anemia is likely due to chronic disease, and is currently asymptomatic. She showed no signs of exacerbated anemia.  3. HTN Her hypertension is well controled. PT's isolated elevelated reading may have been due to distress from her emergent situation.  4. Medication Problem PT was concerned about her reasons for taking some of her medications and was not sure if some of them were for the same problem.  5. 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      Plan:     1. Nosebleed PT should be referred to ENT for further evaluation. Advise to seek medical attention for recurrent nosebleed.  2. Anemia PT should have a CBC drawn to assess hemoglobin. Advise to seek medical attention if experiences diaphoresis, lightheadedness, weakness, increased paleness of her skin, or signs of cyanosis.  3. HTN PT should keep a home log of her blood pressures and bring that log to  her next visit. PT should continue her current medication regimen and seek medical attention if she has persistent readings above 180/100  4. Medication Problem PT reviewed her medication list with me and was given a list of her medications and why she takes them.  5. DM2 PT is having an H1AC and lipid panel drawn today. Her neurological complications appear to be stable and she should continue her current medication regimen. Further changes will be made to this plan pending the results of her HA1C and lipid panel.      I have interviewed and examined Ms. Julien Girt. I agree with the evaluation, assessment and plan as outline above by Mr. Kai Levins. She will be notifed by letter of her lab results. She should return in 3 months for routine follow-up. M.Norins, MD

## 2012-05-04 ENCOUNTER — Other Ambulatory Visit: Payer: Self-pay | Admitting: *Deleted

## 2012-05-04 ENCOUNTER — Encounter (INDEPENDENT_AMBULATORY_CARE_PROVIDER_SITE_OTHER): Payer: Medicare Other | Admitting: Internal Medicine

## 2012-05-04 DIAGNOSIS — E1142 Type 2 diabetes mellitus with diabetic polyneuropathy: Secondary | ICD-10-CM

## 2012-05-04 DIAGNOSIS — E1149 Type 2 diabetes mellitus with other diabetic neurological complication: Secondary | ICD-10-CM

## 2012-05-04 MED ORDER — METFORMIN HCL 1000 MG PO TABS: 1000.0000 mg | ORAL_TABLET | Freq: Two times a day (BID) | ORAL | Status: DC

## 2012-05-04 MED ORDER — OMEPRAZOLE 20 MG PO CPDR: 20.0000 mg | DELAYED_RELEASE_CAPSULE | Freq: Two times a day (BID) | ORAL | Status: DC

## 2012-05-26 ENCOUNTER — Other Ambulatory Visit: Payer: Self-pay

## 2012-05-26 MED ORDER — METOPROLOL SUCCINATE ER 50 MG PO TB24: 50.0000 mg | ORAL_TABLET | Freq: Every day | ORAL | Status: DC

## 2012-05-26 MED ORDER — POTASSIUM CHLORIDE ER 10 MEQ PO CPCR: 10.0000 meq | ORAL_CAPSULE | Freq: Two times a day (BID) | ORAL | Status: DC

## 2012-05-26 MED ORDER — SIMVASTATIN 80 MG PO TABS: 80.0000 mg | ORAL_TABLET | Freq: Every day | ORAL | Status: DC

## 2012-06-07 ENCOUNTER — Encounter: Payer: Self-pay | Admitting: Pulmonary Disease

## 2012-06-07 ENCOUNTER — Ambulatory Visit (INDEPENDENT_AMBULATORY_CARE_PROVIDER_SITE_OTHER): Payer: Medicare Other | Admitting: Pulmonary Disease

## 2012-06-07 VITALS — BP 124/72 | HR 76 | Temp 98.0°F | Ht 63.0 in | Wt 197.4 lb

## 2012-06-07 DIAGNOSIS — J449 Chronic obstructive pulmonary disease, unspecified: Secondary | ICD-10-CM

## 2012-06-07 DIAGNOSIS — J9610 Chronic respiratory failure, unspecified whether with hypoxia or hypercapnia: Secondary | ICD-10-CM

## 2012-06-07 DIAGNOSIS — R0902 Hypoxemia: Secondary | ICD-10-CM

## 2012-06-07 DIAGNOSIS — G4733 Obstructive sleep apnea (adult) (pediatric): Secondary | ICD-10-CM

## 2012-06-07 DIAGNOSIS — J9611 Chronic respiratory failure with hypoxia: Secondary | ICD-10-CM

## 2012-06-07 NOTE — Assessment & Plan Note (Signed)
She is compliant and reports benefit from BPAP therapy.

## 2012-06-07 NOTE — Assessment & Plan Note (Signed)
She has mild COPD.  She is doing well with prn albuterol.  She got her flu shot earlier this week.

## 2012-06-07 NOTE — Assessment & Plan Note (Addendum)
She is to continue 3 liters oxygen 24/7. 

## 2012-06-07 NOTE — Progress Notes (Signed)
Chief Complaint  Patient presents with  . Follow-up    she has good and bad days w/ her breathing, very little cough.     History of Present Illness: Megan Mathews is a 78 y.o. female former smoker with COPD, OSA using BPAP 10/7 and chronic hypoxic respiratory failure using 3 L O2.  She has been doing okay.  She has been off advair since her last visit.  She gets occasional cough with clear sputum.  She uses albuterol about once per day.    She had trouble with water build up in her BPAP.  This was eventually fixed, and she was able to restart using her BPAP.  She uses this every night.  She got her flu shot this week.  Tests: Echo 04/24/08>>EF 55%, mod LA dilation, mild MR, mod TR, Severe PAH PFT 06/19/08>>FEV1 1.18(68%), FEV1% 63, TLC 3.15(72%), DLCO 43%, +BD  PSG 09/27/07>>AHI 11, O2 nadir 56% 03/19/09  Room air at rest SaO2 87% Spirometry 04/27/11>>FEV1 1.24(74%), FEV1% 76   Past Medical History  Diagnosis Date  . COPD (chronic obstructive pulmonary disease)   . OSA (obstructive sleep apnea)   . Hypoxemia   . Secondary pulmonary hypertension   . CAD (coronary artery disease)   . Diabetes mellitus type II   . Hyperlipidemia   . Hypertension   . Peripheral vascular disease   . Tobacco abuse   . Constipation   . Anxiety   . Depression     Past Surgical History  Procedure Date  . Carotid endarterectomy     left, right   . Total abdominal hysterectomy   . Aaa stent   . Bilateral iliac stenting   . Resection of vocal chord lesions   . Excision serous cyst adenofibroma     Allergies  Allergen Reactions  . Iohexol      Code: FEVER, Desc: per pt: rash fever unable to stand     Physical Exam:  Blood pressure 124/72, pulse 76, temperature 98 F (36.7 C), temperature source Oral, height 5\' 3"  (1.6 m), weight 197 lb 6.4 oz (89.54 kg), SpO2 98.00%.  Body mass index is 34.97 kg/(m^2). Wt Readings from Last 2 Encounters:  06/07/12 197 lb 6.4 oz (89.54 kg)    05/03/12 200 lb (90.719 kg)    General - obese, wearing oxygen  HEENT - no sinus tenderness, no oral exudate, wearing dentures, no LAN  Cardiac - s1s2 no murmur  Chest - prolonged exhalation, no wheeze/rales  Abd - soft, nontender  Ext - no edema  Neuro - decreased hearing, normal strength   Assessment/Plan:  Outpatient Encounter Prescriptions as of 06/07/2012  Medication Sig Dispense Refill  . albuterol (PROAIR HFA) 108 (90 BASE) MCG/ACT inhaler Inhale 2 puffs into the lungs every 4 (four) hours as needed (Cough, wheeze, or chest congestion). 2 puffs up to 4 times a day as needed  1 Inhaler  5  . albuterol (PROVENTIL) (2.5 MG/3ML) 0.083% nebulizer solution Take 3 mLs (2.5 mg total) by nebulization every 6 (six) hours as needed.  75 mL  11  . ALPRAZolam (XANAX) 0.25 MG tablet Take 1 tablet (0.25 mg total) by mouth 2 (two) times daily as needed for anxiety.  60 tablet  1  . amitriptyline (ELAVIL) 25 MG tablet Take 1 tablet (25 mg total) by mouth at bedtime.  270 tablet  3  . amLODipine (NORVASC) 5 MG tablet Take 1 tablet (5 mg total) by mouth daily. Patient needs to make appt with  Dr. Debby Bud for further refills.  90 tablet  0  . aspirin 325 MG tablet Take 325 mg by mouth daily.        . Calcium Carbonate-Vitamin D (CALCIUM 600+D) 600-400 MG-UNIT per tablet Take 1 tablet by mouth daily.        . clopidogrel (PLAVIX) 75 MG tablet Take 1 tablet (75 mg total) by mouth daily.  90 tablet  0  . FLUoxetine (PROZAC) 20 MG capsule Take 1 capsule (20 mg total) by mouth daily.  30 capsule  11  . furosemide (LASIX) 40 MG tablet Take 1 tablet (40 mg total) by mouth daily.  30 tablet  3  . guaifenesin (MUCUS RELIEF) 400 MG TABS 2 tablets every 12 hours as needed       . metoprolol succinate (TOPROL-XL) 50 MG 24 hr tablet Take 1 tablet (50 mg total) by mouth daily. Take with or immediately following a meal.  90 tablet  1  . Multiple Vitamins-Minerals (MULTIVITAL PO) Once a day       . nitroGLYCERIN  (NITROSTAT) 0.4 MG SL tablet Place 0.4 mg under the tongue every 5 (five) minutes as needed. For chest pain      . omeprazole (PRILOSEC) 20 MG capsule Take 1 capsule (20 mg total) by mouth 2 (two) times daily.  60 capsule  5  . oxybutynin (DITROPAN-XL) 10 MG 24 hr tablet Take 1 tablet (10 mg total) by mouth daily.  90 tablet  1  . oxymetazoline (AFRIN NASAL SPRAY) 0.05 % nasal spray 1 spray into right nostril if bleeding won't stop.  May repeat once.      . pentoxifylline (TRENTAL) 400 MG CR tablet Take 1 tablet (400 mg total) by mouth 3 (three) times daily.  270 tablet  1  . potassium chloride (MICRO-K) 10 MEQ CR capsule Take 1 capsule (10 mEq total) by mouth 2 (two) times daily.  180 capsule  1  . SENNOSIDES-DOCUSATE SODIUM PO Once a day       . simvastatin (ZOCOR) 80 MG tablet Take 1 tablet (80 mg total) by mouth at bedtime.  90 tablet  1  . glimepiride (AMARYL) 2 MG tablet Take 1 tablet (2 mg total) by mouth daily.  30 tablet  6  . isosorbide mononitrate (IMDUR) 60 MG 24 hr tablet Take 1.5 mg by mouth daily. 1.5 tablets daily      . metFORMIN (GLUCOPHAGE) 1000 MG tablet Take 1 tablet (1,000 mg total) by mouth 2 (two) times daily with a meal.  180 tablet  1    Shaunie Boehm Pager:  [phone] 06/07/2012, 9:40 AM

## 2012-06-07 NOTE — Patient Instructions (Signed)
Follow up in 6 months 

## 2012-06-26 ENCOUNTER — Other Ambulatory Visit: Payer: Self-pay

## 2012-06-26 MED ORDER — METFORMIN HCL 1000 MG PO TABS: 1000.0000 mg | ORAL_TABLET | Freq: Two times a day (BID) | ORAL | Status: DC

## 2012-06-28 ENCOUNTER — Other Ambulatory Visit: Payer: Self-pay | Admitting: *Deleted

## 2012-06-28 MED ORDER — METFORMIN HCL 1000 MG PO TABS: 1000.0000 mg | ORAL_TABLET | Freq: Two times a day (BID) | ORAL | Status: DC

## 2012-07-05 ENCOUNTER — Ambulatory Visit: Payer: Medicare Other | Admitting: Pulmonary Disease

## 2012-07-05 ENCOUNTER — Other Ambulatory Visit: Payer: Self-pay | Admitting: *Deleted

## 2012-07-05 MED ORDER — CLOPIDOGREL BISULFATE 75 MG PO TABS: 75.0000 mg | ORAL_TABLET | Freq: Every day | ORAL | Status: DC

## 2012-07-05 MED ORDER — AMLODIPINE BESYLATE 5 MG PO TABS: 5.0000 mg | ORAL_TABLET | Freq: Every day | ORAL | Status: DC

## 2012-07-05 NOTE — Telephone Encounter (Signed)
R'cd fax from [facility] for refill of Plavix and Amlodipine.

## 2012-07-10 ENCOUNTER — Other Ambulatory Visit: Payer: Self-pay | Admitting: *Deleted

## 2012-07-10 MED ORDER — FUROSEMIDE 40 MG PO TABS: 40.0000 mg | ORAL_TABLET | Freq: Every day | ORAL | Status: DC

## 2012-07-10 NOTE — Telephone Encounter (Signed)
R'cd fax from [facility] for refill of Furosemide.  

## 2012-07-20 ENCOUNTER — Other Ambulatory Visit (HOSPITAL_COMMUNITY): Payer: Self-pay | Admitting: Cardiovascular Disease

## 2012-07-20 DIAGNOSIS — I701 Atherosclerosis of renal artery: Secondary | ICD-10-CM

## 2012-07-20 DIAGNOSIS — I10 Essential (primary) hypertension: Secondary | ICD-10-CM

## 2012-07-31 ENCOUNTER — Encounter (HOSPITAL_COMMUNITY): Payer: Medicare Other

## 2012-08-01 ENCOUNTER — Telehealth: Payer: Self-pay | Admitting: Internal Medicine

## 2012-08-01 NOTE — Telephone Encounter (Signed)
Patient's daughter calling because her mother is having terrible abdominal pain in her upper abdomen. States the patient just called her and she told her it started today but daughter is not sure when it started. States the pain is above her belly button. Denies constipation or diarrhea but patient is having nausea. Denies vomiting. Offered OV tomorrow if patient can wait. She states she thinks her mother will want to go to ED because she is in such pain. She will take her mother to ED per daughter

## 2012-08-02 ENCOUNTER — Emergency Department (HOSPITAL_COMMUNITY): Payer: Medicare Other

## 2012-08-02 ENCOUNTER — Emergency Department (HOSPITAL_COMMUNITY)
Admission: EM | Admit: 2012-08-02 | Discharge: 2012-08-02 | Disposition: A | Discharge status: Home / Self Care | Payer: Medicare Other | Attending: Emergency Medicine | Admitting: Emergency Medicine

## 2012-08-02 ENCOUNTER — Encounter (HOSPITAL_COMMUNITY): Payer: Self-pay | Admitting: *Deleted

## 2012-08-02 DIAGNOSIS — N390 Urinary tract infection, site not specified: Secondary | ICD-10-CM | POA: Insufficient documentation

## 2012-08-02 DIAGNOSIS — E785 Hyperlipidemia, unspecified: Secondary | ICD-10-CM | POA: Insufficient documentation

## 2012-08-02 DIAGNOSIS — G4733 Obstructive sleep apnea (adult) (pediatric): Secondary | ICD-10-CM | POA: Insufficient documentation

## 2012-08-02 DIAGNOSIS — F411 Generalized anxiety disorder: Secondary | ICD-10-CM | POA: Insufficient documentation

## 2012-08-02 DIAGNOSIS — E119 Type 2 diabetes mellitus without complications: Secondary | ICD-10-CM | POA: Insufficient documentation

## 2012-08-02 DIAGNOSIS — Z8719 Personal history of other diseases of the digestive system: Secondary | ICD-10-CM | POA: Insufficient documentation

## 2012-08-02 DIAGNOSIS — Z7982 Long term (current) use of aspirin: Secondary | ICD-10-CM | POA: Insufficient documentation

## 2012-08-02 DIAGNOSIS — F3289 Other specified depressive episodes: Secondary | ICD-10-CM | POA: Insufficient documentation

## 2012-08-02 DIAGNOSIS — J4489 Other specified chronic obstructive pulmonary disease: Secondary | ICD-10-CM | POA: Insufficient documentation

## 2012-08-02 DIAGNOSIS — Z79899 Other long term (current) drug therapy: Secondary | ICD-10-CM | POA: Insufficient documentation

## 2012-08-02 DIAGNOSIS — R110 Nausea: Secondary | ICD-10-CM | POA: Insufficient documentation

## 2012-08-02 DIAGNOSIS — Z87891 Personal history of nicotine dependence: Secondary | ICD-10-CM | POA: Insufficient documentation

## 2012-08-02 DIAGNOSIS — I739 Peripheral vascular disease, unspecified: Secondary | ICD-10-CM | POA: Insufficient documentation

## 2012-08-02 DIAGNOSIS — I2510 Atherosclerotic heart disease of native coronary artery without angina pectoris: Secondary | ICD-10-CM | POA: Insufficient documentation

## 2012-08-02 DIAGNOSIS — F329 Major depressive disorder, single episode, unspecified: Secondary | ICD-10-CM | POA: Insufficient documentation

## 2012-08-02 DIAGNOSIS — J449 Chronic obstructive pulmonary disease, unspecified: Secondary | ICD-10-CM | POA: Insufficient documentation

## 2012-08-02 DIAGNOSIS — I2789 Other specified pulmonary heart diseases: Secondary | ICD-10-CM | POA: Insufficient documentation

## 2012-08-02 LAB — URINALYSIS, MICROSCOPIC ONLY
Glucose, UA: NEGATIVE mg/dL
Specific Gravity, Urine: 1.014 (ref 1.005–1.030)
pH: 5 (ref 5.0–8.0)

## 2012-08-02 LAB — COMPREHENSIVE METABOLIC PANEL
AST: 29 U/L (ref 0–37)
Albumin: 3.8 g/dL (ref 3.5–5.2)
Calcium: 10.3 mg/dL (ref 8.4–10.5)
Chloride: 100 mEq/L (ref 96–112)
Creatinine, Ser: 1.14 mg/dL — ABNORMAL HIGH (ref 0.50–1.10)
Sodium: 139 mEq/L (ref 135–145)

## 2012-08-02 LAB — CBC WITH DIFFERENTIAL/PLATELET
Basophils Absolute: 0 10*3/uL (ref 0.0–0.1)
Basophils Relative: 0 % (ref 0–1)
Eosinophils Relative: 2 % (ref 0–5)
HCT: 32.3 % — ABNORMAL LOW (ref 36.0–46.0)
MCHC: 31.6 g/dL (ref 30.0–36.0)
Monocytes Absolute: 0.9 10*3/uL (ref 0.1–1.0)
Neutro Abs: 8.3 10*3/uL — ABNORMAL HIGH (ref 1.7–7.7)
Platelets: 355 10*3/uL (ref 150–400)
RDW: 15.2 % (ref 11.5–15.5)

## 2012-08-02 MED ORDER — CEPHALEXIN 500 MG PO CAPS: 500.0000 mg | ORAL_CAPSULE | Freq: Four times a day (QID) | ORAL | Status: DC

## 2012-08-02 MED ORDER — DEXTROSE 5 % IV SOLN
1.0000 g | Freq: Once | INTRAVENOUS | Status: AC
  Administered 2012-08-02: 1 g via INTRAVENOUS
  Filled 2012-08-02: qty 10

## 2012-08-02 MED ORDER — SODIUM CHLORIDE 0.9 % IV SOLN
INTRAVENOUS | Status: DC
  Administered 2012-08-02: 17:00:00 via INTRAVENOUS

## 2012-08-02 NOTE — ED Notes (Signed)
Informed CT that patient is finished drinking her contrast media

## 2012-08-02 NOTE — ED Notes (Signed)
Family member Lupita Leash [phone]

## 2012-08-02 NOTE — ED Notes (Signed)
Pt reports having upper abd pain and distention that started yesterday evening, pain then moved to lower abd, having nausea, not eating. Last bm was yesterday. cbg 120 at triage.

## 2012-08-02 NOTE — ED Provider Notes (Signed)
History     CSN: [id number]  Arrival date & time 08/02/12  1426   First MD Initiated Contact with Patient 08/02/12 1553      Chief Complaint  Patient presents with  . Abdominal Pain    (Consider location/radiation/quality/duration/timing/severity/associated sxs/prior treatment) HPI Comments: Megan Mathews presents with a 24 hour history of intermittent abdominal cramping pain and intermittent episodes of distention with nausea but no emesis.  Her pain has been migratory,  Starting in the epigastric region yesterday,  But has also felt periumbilical pain and bilateral lower quadrant pain as well.  She has had regular bowel movements,  Most recently last night which did not relieve her symptoms.  She has had increasing episodes of flatulence since arriving here which she states does help some with the cramping.  She denies chest pain and shortness of breath.  She took an otc gas reliever medication without relief of symptoms last night.  The history is provided by the patient.    Past Medical History  Diagnosis Date  . COPD (chronic obstructive pulmonary disease)   . OSA (obstructive sleep apnea)   . Hypoxemia   . Secondary pulmonary hypertension   . CAD (coronary artery disease)   . Diabetes mellitus type II   . Hyperlipidemia   . Hypertension   . Peripheral vascular disease   . Tobacco abuse   . Constipation   . Anxiety   . Depression     Past Surgical History  Procedure Date  . Carotid endarterectomy     left, right   . Total abdominal hysterectomy   . Aaa stent   . Bilateral iliac stenting   . Resection of vocal chord lesions   . Excision serous cyst adenofibroma     History reviewed. No pertinent family history.  History  Substance Use Topics  . Smoking status: Former Smoker -- 1.0 packs/day for 60 years    Types: Cigarettes    Quit date: 10/08/1997  . Smokeless tobacco: Former Neurosurgeon  . Alcohol Use: No    OB History    Grav Para Term Preterm Abortions  TAB SAB Ect Mult Living                  Review of Systems  Constitutional: Negative for fever.  HENT: Negative for congestion, sore throat and neck pain.   Eyes: Negative.   Respiratory: Negative for chest tightness and shortness of breath.   Cardiovascular: Negative for chest pain.  Gastrointestinal: Positive for nausea, abdominal pain and abdominal distention. Negative for vomiting, diarrhea and constipation.  Genitourinary: Negative.   Musculoskeletal: Negative for joint swelling and arthralgias.  Skin: Negative.  Negative for rash and wound.  Neurological: Negative for dizziness, weakness, light-headedness, numbness and headaches.  Hematological: Negative.   Psychiatric/Behavioral: Negative.     Allergies  Iohexol  Home Medications   Current Outpatient Rx  Name  Route  Sig  Dispense  Refill  . ALBUTEROL SULFATE HFA 108 (90 BASE) MCG/ACT IN AERS   Inhalation   Inhale 2 puffs into the lungs every 4 (four) hours as needed (Cough, wheeze, or chest congestion). 2 puffs up to 4 times a day as needed   1 Inhaler   5   . ALBUTEROL SULFATE (2.5 MG/3ML) 0.083% IN NEBU   Nebulization   Take 2.5 mg by nebulization every 6 (six) hours as needed. For shortness of breath         . ALPRAZOLAM 0.25 MG PO TABS  Oral   Take 1 tablet (0.25 mg total) by mouth 2 (two) times daily as needed for anxiety.   60 tablet   1   . AMITRIPTYLINE HCL 25 MG PO TABS   Oral   Take 1 tablet (25 mg total) by mouth at bedtime.   270 tablet   3   . AMLODIPINE BESYLATE 5 MG PO TABS   Oral   Take 1 tablet (5 mg total) by mouth daily.   90 tablet   1   . ASPIRIN 325 MG PO TABS   Oral   Take 325 mg by mouth daily.          Marland Kitchen CALCIUM CARBONATE-VITAMIN D 600-400 MG-UNIT PO TABS   Oral   Take 1 tablet by mouth daily.           Marland Kitchen CLOPIDOGREL BISULFATE 75 MG PO TABS   Oral   Take 1 tablet (75 mg total) by mouth daily.   90 tablet   1   . FLUOXETINE HCL 20 MG PO CAPS   Oral   Take 1  capsule (20 mg total) by mouth daily.   30 capsule   11   . FUROSEMIDE 40 MG PO TABS   Oral   Take 1 tablet (40 mg total) by mouth daily.   30 tablet   5   . GLIMEPIRIDE 2 MG PO TABS   Oral   Take 1 tablet (2 mg total) by mouth daily.   30 tablet   6   . GUAIFENESIN 400 MG PO TABS   Oral   Take 400 mg by mouth 2 (two) times daily as needed. For congestion         . ISOSORBIDE MONONITRATE ER 60 MG PO TB24   Oral   Take 90 mg by mouth daily.          Marland Kitchen METFORMIN HCL 1000 MG PO TABS   Oral   Take 1 tablet (1,000 mg total) by mouth 2 (two) times daily with a meal.   180 tablet   3   . METOPROLOL SUCCINATE ER 50 MG PO TB24   Oral   Take 1 tablet (50 mg total) by mouth daily. Take with or immediately following a meal.   90 tablet   1   . MULTIVITAL PO   Oral   Take 1 tablet by mouth daily.          Marland Kitchen NITROGLYCERIN 0.4 MG SL SUBL   Sublingual   Place 0.4 mg under the tongue every 5 (five) minutes as needed. For chest pain         . OMEPRAZOLE 20 MG PO CPDR   Oral   Take 1 capsule (20 mg total) by mouth 2 (two) times daily.   60 capsule   5   . OXYBUTYNIN CHLORIDE ER 10 MG PO TB24   Oral   Take 1 tablet (10 mg total) by mouth daily.   90 tablet   1   . OXYMETAZOLINE HCL 0.05 % NA SOLN      1 spray into right nostril if bleeding won't stop.  May repeat once.         Marland Kitchen PENTOXIFYLLINE ER 400 MG PO TBCR   Oral   Take 1 tablet (400 mg total) by mouth 3 (three) times daily.   270 tablet   1   . POTASSIUM CHLORIDE ER 10 MEQ PO CPCR   Oral   Take 1 capsule (10  mEq total) by mouth 2 (two) times daily.   180 capsule   1   . SENNOSIDES 8.6 MG PO TABS   Oral   Take 1 tablet by mouth daily.         Marland Kitchen SIMVASTATIN 80 MG PO TABS   Oral   Take 1 tablet (80 mg total) by mouth at bedtime.   90 tablet   1     BP 170/58  Pulse 75  Temp 98.7 F (37.1 C) (Oral)  Resp 20  SpO2 100%  Physical Exam  Nursing note and vitals  reviewed. Constitutional: She appears well-developed and well-nourished.  HENT:  Head: Normocephalic and atraumatic.  Eyes: Conjunctivae normal are normal.  Neck: Normal range of motion.  Cardiovascular: Normal rate, regular rhythm, normal heart sounds and intact distal pulses.   Pulmonary/Chest: Effort normal and breath sounds normal. She has no wheezes.  Abdominal: Soft. Bowel sounds are normal. She exhibits no distension. There is no hepatosplenomegaly. There is tenderness in the suprapubic area. There is no rigidity, no rebound and no guarding.  Musculoskeletal: Normal range of motion.  Neurological: She is alert.  Skin: Skin is warm and dry.  Psychiatric: She has a normal mood and affect.    ED Course  Procedures (including critical care time)  Labs Reviewed  CBC WITH DIFFERENTIAL - Abnormal; Notable for the following:    WBC 11.9 (*)     RBC 3.75 (*)     Hemoglobin 10.2 (*)     HCT 32.3 (*)     Neutro Abs 8.3 (*)     All other components within normal limits  COMPREHENSIVE METABOLIC PANEL - Abnormal; Notable for the following:    Glucose, Bld 125 (*)     Creatinine, Ser 1.14 (*)     Total Bilirubin 0.2 (*)     GFR calc non Af Amer 45 (*)     GFR calc Af Amer 52 (*)     All other components within normal limits  LIPASE, BLOOD - Abnormal; Notable for the following:    Lipase 70 (*)     All other components within normal limits  URINALYSIS, MICROSCOPIC ONLY - Abnormal; Notable for the following:    Nitrite POSITIVE (*)     Leukocytes, UA SMALL (*)     Bacteria, UA MANY (*)     Squamous Epithelial / LPF FEW (*)     All other components within normal limits  URINE CULTURE   Ct Abdomen Pelvis Wo Contrast  08/02/2012  *RADIOLOGY REPORT*  Clinical Data: Upper abdominal pain.  CT ABDOMEN AND PELVIS WITHOUT CONTRAST  Technique:  Multidetector CT imaging of the abdomen and pelvis was performed following the standard protocol without intravenous contrast.  Comparison: CT of  the abdomen and pelvis 07/27/2010.  Findings:  Lung Bases: Mild scarring in the lung bases bilaterally.  Mild cardiomegaly.  Atherosclerotic calcifications in the distal left circumflex and right coronary arteries.  Abdomen/Pelvis:  Decreased attenuation throughout the hepatic parenchyma, compatible with hepatic steatosis.  The unenhanced appearance of the gallbladder, pancreas, spleen, bilateral adrenal glands and the left kidney is unremarkable.  In the right renal hilum there are several small calcifications which are similar to prior study 07/27/2010 and are generally favored to be vascular, however, nonobstructive calculi are difficult to entirely exclude. No calcifications are noted along the course of either ureter, or within the lumen of the urinary bladder.  Additionally, no hydroureteronephrosis is noted to suggest urinary tract obstruction at  this time.  Extensive atherosclerosis of the abdominal and pelvic vasculature, without definite aneurysm.  There are aorto-iliac stents in place bilaterally.  No ascites or pneumoperitoneum and no pathologic distension of small bowel.  No definite pathologic lymphadenopathy identified within the abdomen or pelvis.  The appendix is not confidently identified and has likely been surgically resected. Regardless, no inflammatory changes are noted around the cecum to suggest the presence of an acute appendicitis. Status post total abdominal hysterectomy and bilateral salpingo-oophorectomy. Urinary bladder is unremarkable in appearance.  Musculoskeletal: There are no aggressive appearing lytic or blastic lesions noted in the visualized portions of the skeleton.  IMPRESSION: 1.  No acute findings in the abdomen or pelvis to account for the patient's symptoms. 2.  Hepatic steatosis. 3.  Extensive atherosclerosis, including at least two-vessel coronary artery disease. 4.  Calcifications in the right renal hilum, favored to be vascular; tiny nonobstructive calculi are  difficult to entirely exclude. 5.  Additional incidental findings, as above.   Original Report Authenticated By: Trudie Reed, M.D.      1. UTI (lower urinary tract infection)     9:23 PM Patient reports has had increased flatulence since arrival,  And had a very loose bm just a few minutes ago with improvement in her abdominal cramping symptoms.  She has been given a dose of rocephin 1 gram IV for her uti prior to dc home.   MDM  Patient with uti per UA completed,  cx pending.  Abdomen is soft at re-exam prior to dc home,  She denies abdominal pain at this time and has had several packs of graham crackers,  A cola and now eating apple sauce without sx.  Reports hunger.  Dg at bedside.  Will plan to dc home with keflex,  B.r.a.t diet,  Encouraged yogurt additionally to help prevent diarrhea from abx.  Advised to return here if she develops worse sx, such as return of pain, worsened diarrhea, fevers.    Labs and Ct reviewed and discussed with Dr Manus Gunning prior to dc home, who also examined pt.  Note that pt is on chronic home O2 at 3L.        Burgess Amor, [place] 08/02/12 2157

## 2012-08-03 NOTE — ED Provider Notes (Signed)
Medical screening examination/treatment/procedure(s) were conducted as a shared visit with non-physician practitioner(s) and myself.  I personally evaluated the patient during the encounter  Intermittent crampy abdominal pain that has migrated from upper abdomen to lower. No vomiting. Normal BMs. Mild suprapubic tenderness without guarding. Intact distal pulses.  Glynn Octave, MD 08/03/12 [phone]

## 2012-08-04 LAB — URINE CULTURE: Colony Count: >=100000

## 2012-08-05 NOTE — ED Notes (Signed)
+   Urine Patient treated with Keflex-sensitive to same-chart appended per protocol MD. 

## 2012-08-07 ENCOUNTER — Other Ambulatory Visit: Payer: Self-pay | Admitting: Internal Medicine

## 2012-08-07 LAB — GLUCOSE, CAPILLARY: Glucose-Capillary: 120 mg/dL — ABNORMAL HIGH (ref 70–99)

## 2012-08-11 ENCOUNTER — Other Ambulatory Visit: Payer: Self-pay | Admitting: *Deleted

## 2012-08-14 ENCOUNTER — Encounter (HOSPITAL_COMMUNITY): Payer: Medicare Other

## 2012-08-15 MED ORDER — ALPRAZOLAM 0.25 MG PO TABS: 0.2500 mg | ORAL_TABLET | Freq: Two times a day (BID) | ORAL | Status: DC | PRN

## 2012-08-22 ENCOUNTER — Ambulatory Visit (HOSPITAL_COMMUNITY)
Admission: RE | Admit: 2012-08-22 | Discharge: 2012-08-22 | Disposition: A | Discharge status: Home / Self Care | Payer: Medicare Other | Source: Ambulatory Visit | Attending: Cardiovascular Disease | Admitting: Cardiovascular Disease

## 2012-08-22 DIAGNOSIS — I701 Atherosclerosis of renal artery: Secondary | ICD-10-CM

## 2012-08-22 DIAGNOSIS — I10 Essential (primary) hypertension: Secondary | ICD-10-CM

## 2012-08-22 NOTE — Progress Notes (Signed)
Renal arterial duplex completed 

## 2012-08-29 ENCOUNTER — Other Ambulatory Visit: Payer: Self-pay | Admitting: *Deleted

## 2012-08-29 MED ORDER — ALPRAZOLAM 0.25 MG PO TABS: 0.2500 mg | ORAL_TABLET | Freq: Two times a day (BID) | ORAL | Status: DC | PRN

## 2012-10-02 ENCOUNTER — Other Ambulatory Visit: Payer: Self-pay | Admitting: *Deleted

## 2012-10-02 MED ORDER — OXYBUTYNIN CHLORIDE ER 10 MG PO TB24: 10.0000 mg | ORAL_TABLET | Freq: Every day | ORAL | Status: DC

## 2012-10-19 ENCOUNTER — Other Ambulatory Visit: Payer: Self-pay | Admitting: Internal Medicine

## 2012-10-26 ENCOUNTER — Telehealth: Payer: Self-pay | Admitting: Pulmonary Disease

## 2012-10-26 NOTE — Telephone Encounter (Signed)
This form was done and faxed back on 10/03/12. This is also scanned into [employer]. I will refax this back to them again. Pt is aware of this. She needed nothing further

## 2012-10-30 ENCOUNTER — Other Ambulatory Visit: Payer: Self-pay | Admitting: General Practice

## 2012-10-30 MED ORDER — GLIMEPIRIDE 2 MG PO TABS: 2.0000 mg | ORAL_TABLET | Freq: Every day | ORAL | Status: DC

## 2012-10-30 NOTE — Telephone Encounter (Signed)
Med filled.  

## 2012-10-31 ENCOUNTER — Other Ambulatory Visit: Payer: Self-pay | Admitting: *Deleted

## 2012-10-31 ENCOUNTER — Encounter: Payer: Self-pay | Admitting: Cardiology

## 2012-10-31 MED ORDER — OMEPRAZOLE 20 MG PO CPDR: 20.0000 mg | DELAYED_RELEASE_CAPSULE | Freq: Two times a day (BID) | ORAL | Status: DC

## 2012-10-31 NOTE — Telephone Encounter (Signed)
Unsure of what this medication is.

## 2012-11-01 MED ORDER — PENTOXIFYLLINE ER 400 MG PO TBCR: 400.0000 mg | EXTENDED_RELEASE_TABLET | Freq: Three times a day (TID) | ORAL | Status: DC

## 2012-11-08 ENCOUNTER — Other Ambulatory Visit: Payer: Self-pay | Admitting: *Deleted

## 2012-11-08 MED ORDER — FLUOXETINE HCL 20 MG PO CAPS: 20.0000 mg | ORAL_CAPSULE | Freq: Every day | ORAL | Status: DC

## 2012-11-14 ENCOUNTER — Other Ambulatory Visit (HOSPITAL_COMMUNITY): Payer: Self-pay | Admitting: Cardiovascular Disease

## 2012-11-14 DIAGNOSIS — R079 Chest pain, unspecified: Secondary | ICD-10-CM

## 2012-11-14 DIAGNOSIS — R011 Cardiac murmur, unspecified: Secondary | ICD-10-CM

## 2012-11-14 DIAGNOSIS — I739 Peripheral vascular disease, unspecified: Secondary | ICD-10-CM

## 2012-11-15 ENCOUNTER — Ambulatory Visit (HOSPITAL_COMMUNITY)
Admission: RE | Admit: 2012-11-15 | Discharge: 2012-11-15 | Disposition: A | Discharge status: Home / Self Care | Payer: Medicare Other | Source: Ambulatory Visit | Attending: Cardiovascular Disease | Admitting: Cardiovascular Disease

## 2012-11-15 DIAGNOSIS — I739 Peripheral vascular disease, unspecified: Secondary | ICD-10-CM | POA: Insufficient documentation

## 2012-11-15 NOTE — Progress Notes (Signed)
Carotid Duplex Complete Megan Mathews 

## 2012-11-27 ENCOUNTER — Encounter (HOSPITAL_COMMUNITY): Payer: Medicare Other

## 2012-11-28 ENCOUNTER — Ambulatory Visit (HOSPITAL_COMMUNITY): Payer: Medicare Other

## 2012-11-28 ENCOUNTER — Encounter (HOSPITAL_COMMUNITY): Payer: Medicare Other

## 2012-12-07 ENCOUNTER — Encounter: Payer: Self-pay | Admitting: Cardiovascular Disease

## 2012-12-27 ENCOUNTER — Other Ambulatory Visit: Payer: Self-pay

## 2012-12-27 MED ORDER — POTASSIUM CHLORIDE ER 10 MEQ PO CPCR: 10.0000 meq | ORAL_CAPSULE | Freq: Two times a day (BID) | ORAL | Status: DC

## 2012-12-29 ENCOUNTER — Other Ambulatory Visit: Payer: Self-pay

## 2012-12-29 MED ORDER — CLOPIDOGREL BISULFATE 75 MG PO TABS: 75.0000 mg | ORAL_TABLET | Freq: Every day | ORAL | Status: DC

## 2012-12-29 MED ORDER — AMLODIPINE BESYLATE 5 MG PO TABS: 5.0000 mg | ORAL_TABLET | Freq: Every day | ORAL | Status: DC

## 2013-01-01 ENCOUNTER — Other Ambulatory Visit: Payer: Self-pay

## 2013-01-01 MED ORDER — METOPROLOL SUCCINATE ER 50 MG PO TB24: 50.0000 mg | ORAL_TABLET | Freq: Every day | ORAL | Status: DC

## 2013-01-02 ENCOUNTER — Inpatient Hospital Stay (HOSPITAL_COMMUNITY)
Admission: EM | Admit: 2013-01-02 | Discharge: 2013-01-11 | DRG: 280 | Disposition: A | Discharge status: Home Health | Payer: Medicare Other | Attending: Cardiology | Admitting: Cardiology

## 2013-01-02 ENCOUNTER — Encounter (HOSPITAL_COMMUNITY): Payer: Self-pay | Admitting: *Deleted

## 2013-01-02 DIAGNOSIS — I701 Atherosclerosis of renal artery: Secondary | ICD-10-CM | POA: Diagnosis present

## 2013-01-02 DIAGNOSIS — F411 Generalized anxiety disorder: Secondary | ICD-10-CM | POA: Diagnosis present

## 2013-01-02 DIAGNOSIS — I700 Atherosclerosis of aorta: Secondary | ICD-10-CM | POA: Diagnosis present

## 2013-01-02 DIAGNOSIS — D509 Iron deficiency anemia, unspecified: Secondary | ICD-10-CM | POA: Diagnosis present

## 2013-01-02 DIAGNOSIS — N184 Chronic kidney disease, stage 4 (severe): Secondary | ICD-10-CM

## 2013-01-02 DIAGNOSIS — K219 Gastro-esophageal reflux disease without esophagitis: Secondary | ICD-10-CM | POA: Diagnosis present

## 2013-01-02 DIAGNOSIS — J9611 Chronic respiratory failure with hypoxia: Secondary | ICD-10-CM

## 2013-01-02 DIAGNOSIS — Z7982 Long term (current) use of aspirin: Secondary | ICD-10-CM

## 2013-01-02 DIAGNOSIS — I214 Non-ST elevation (NSTEMI) myocardial infarction: Secondary | ICD-10-CM

## 2013-01-02 DIAGNOSIS — Z7902 Long term (current) use of antithrombotics/antiplatelets: Secondary | ICD-10-CM

## 2013-01-02 DIAGNOSIS — I10 Essential (primary) hypertension: Secondary | ICD-10-CM

## 2013-01-02 DIAGNOSIS — N289 Disorder of kidney and ureter, unspecified: Secondary | ICD-10-CM

## 2013-01-02 DIAGNOSIS — I209 Angina pectoris, unspecified: Secondary | ICD-10-CM

## 2013-01-02 DIAGNOSIS — J449 Chronic obstructive pulmonary disease, unspecified: Secondary | ICD-10-CM | POA: Diagnosis present

## 2013-01-02 DIAGNOSIS — E785 Hyperlipidemia, unspecified: Secondary | ICD-10-CM | POA: Diagnosis present

## 2013-01-02 DIAGNOSIS — IMO0001 Reserved for inherently not codable concepts without codable children: Secondary | ICD-10-CM | POA: Diagnosis present

## 2013-01-02 DIAGNOSIS — R630 Anorexia: Secondary | ICD-10-CM

## 2013-01-02 DIAGNOSIS — N189 Chronic kidney disease, unspecified: Secondary | ICD-10-CM | POA: Diagnosis present

## 2013-01-02 DIAGNOSIS — I471 Supraventricular tachycardia, unspecified: Secondary | ICD-10-CM

## 2013-01-02 DIAGNOSIS — F329 Major depressive disorder, single episode, unspecified: Secondary | ICD-10-CM | POA: Diagnosis present

## 2013-01-02 DIAGNOSIS — I739 Peripheral vascular disease, unspecified: Secondary | ICD-10-CM | POA: Diagnosis present

## 2013-01-02 DIAGNOSIS — I498 Other specified cardiac arrhythmias: Secondary | ICD-10-CM | POA: Diagnosis present

## 2013-01-02 DIAGNOSIS — Z9981 Dependence on supplemental oxygen: Secondary | ICD-10-CM

## 2013-01-02 DIAGNOSIS — D649 Anemia, unspecified: Secondary | ICD-10-CM

## 2013-01-02 DIAGNOSIS — D631 Anemia in chronic kidney disease: Secondary | ICD-10-CM | POA: Diagnosis present

## 2013-01-02 DIAGNOSIS — N170 Acute kidney failure with tubular necrosis: Secondary | ICD-10-CM | POA: Diagnosis not present

## 2013-01-02 DIAGNOSIS — K7689 Other specified diseases of liver: Secondary | ICD-10-CM | POA: Diagnosis present

## 2013-01-02 DIAGNOSIS — I2510 Atherosclerotic heart disease of native coronary artery without angina pectoris: Secondary | ICD-10-CM

## 2013-01-02 DIAGNOSIS — J9610 Chronic respiratory failure, unspecified whether with hypoxia or hypercapnia: Secondary | ICD-10-CM | POA: Diagnosis present

## 2013-01-02 DIAGNOSIS — I2789 Other specified pulmonary heart diseases: Secondary | ICD-10-CM | POA: Diagnosis present

## 2013-01-02 DIAGNOSIS — Z9861 Coronary angioplasty status: Secondary | ICD-10-CM

## 2013-01-02 DIAGNOSIS — I509 Heart failure, unspecified: Secondary | ICD-10-CM | POA: Diagnosis present

## 2013-01-02 DIAGNOSIS — I5043 Acute on chronic combined systolic (congestive) and diastolic (congestive) heart failure: Secondary | ICD-10-CM | POA: Diagnosis not present

## 2013-01-02 DIAGNOSIS — N179 Acute kidney failure, unspecified: Secondary | ICD-10-CM

## 2013-01-02 DIAGNOSIS — J4489 Other specified chronic obstructive pulmonary disease: Secondary | ICD-10-CM | POA: Diagnosis present

## 2013-01-02 DIAGNOSIS — G4733 Obstructive sleep apnea (adult) (pediatric): Secondary | ICD-10-CM | POA: Diagnosis present

## 2013-01-02 DIAGNOSIS — F3289 Other specified depressive episodes: Secondary | ICD-10-CM | POA: Diagnosis present

## 2013-01-02 DIAGNOSIS — R634 Abnormal weight loss: Secondary | ICD-10-CM

## 2013-01-02 DIAGNOSIS — Z79899 Other long term (current) drug therapy: Secondary | ICD-10-CM

## 2013-01-02 DIAGNOSIS — I129 Hypertensive chronic kidney disease with stage 1 through stage 4 chronic kidney disease, or unspecified chronic kidney disease: Secondary | ICD-10-CM | POA: Diagnosis present

## 2013-01-02 DIAGNOSIS — Z87891 Personal history of nicotine dependence: Secondary | ICD-10-CM

## 2013-01-02 HISTORY — DX: Gastritis, unspecified, without bleeding: K29.70

## 2013-01-02 HISTORY — DX: Non-ST elevation (NSTEMI) myocardial infarction: I21.4

## 2013-01-02 HISTORY — DX: Stricture of artery: I77.1

## 2013-01-02 LAB — CBC WITH DIFFERENTIAL/PLATELET
Basophils Absolute: 0.1 10*3/uL (ref 0.0–0.1)
Basophils Relative: 1 % (ref 0–1)
MCHC: 34 g/dL (ref 30.0–36.0)
Monocytes Absolute: 0.7 10*3/uL (ref 0.1–1.0)
Neutro Abs: 7.6 10*3/uL (ref 1.7–7.7)
Neutrophils Relative %: 78 % — ABNORMAL HIGH (ref 43–77)
RDW: 13.8 % (ref 11.5–15.5)

## 2013-01-02 LAB — BASIC METABOLIC PANEL
Chloride: 105 mEq/L (ref 96–112)
Creatinine, Ser: 1.73 mg/dL — ABNORMAL HIGH (ref 0.50–1.10)
GFR calc Af Amer: 31 mL/min — ABNORMAL LOW (ref >90)
Potassium: 4.1 mEq/L (ref 3.5–5.1)

## 2013-01-02 LAB — TROPONIN I: Troponin I: <0.3 ng/mL (ref <0.30)

## 2013-01-02 NOTE — ED Notes (Signed)
Pt denies chest pain; pt states it was hurting all over earlier "before the guy in ambulance gave her the medicine" pt denies n/v. Pt denies dizziness and lightheadedness. Pt denies numbness and tingling. Pt alert and mentating appropriately.

## 2013-01-02 NOTE — ED Provider Notes (Signed)
History     CSN: [id number]  Arrival date & time 01/02/13  4098   First MD Initiated Contact with Patient 01/02/13 2000      Chief Complaint  Patient presents with  . Chest Pain    (Consider location/radiation/quality/duration/timing/severity/associated sxs/prior treatment) Patient is a 79 y.o. female presenting with chest pain. The history is provided by the patient.  Chest Pain She had onset at about 6 PM of severe anterior chest pain radiating to both arms. There was a heavy feeling and tight feeling typical of her angina. There is associated dyspnea but no nausea or diaphoresis. Onset was at rest. She was aware of her heart beating fast. She took aspirin and nitroglycerin with no relief. She rated pain at 8/10. She called EMS who noted that she was in a supraventricular tachycardia with heart rate of about 200. She was given adenosine with conversion to sinus rhythm. EMS reported pain had reduced to 3/10. Patient states that she is now completely pain-free and she states she was pain-free as soon as her heart rate dropped.  Past Medical History  Diagnosis Date  . COPD (chronic obstructive pulmonary disease)     home 02  . OSA (obstructive sleep apnea)   . Hypoxemia   . Secondary pulmonary hypertension 8/09    "severe" by echo  . CAD (coronary artery disease) '95,'96,'98.'99,'08    Multiple PCI/stenting all 3 vessels  . Diabetes mellitus type II   . Hyperlipidemia   . Hypertension   . Peripheral vascular disease '96, July 2010    AoIliac stenting with PTA for ISR 7/10  . Tobacco abuse     quit '99  . Constipation   . Anxiety   . Depression     Past Surgical History  Procedure Laterality Date  . Carotid endarterectomy      bilat with redo on Lt  . Total abdominal hysterectomy    . Aaa stent  10/96  . Bilateral iliac stenting  10/96, 7/10  . Resection of vocal chord lesions    . Excision serous cyst adenofibroma    . Coronary angioplasty  2/95    RCA  . Coronary  angioplasty with stent placement  8/96    RCA  . Coronary angioplasty with stent placement  5/98    RCA X 2  . Coronary angioplasty with stent placement  11/99    CFX,LAD,Dx  . Coronary angiogram  12/08    patent stents, 40% LM    History reviewed. No pertinent family history.  History  Substance Use Topics  . Smoking status: Former Smoker -- 1.00 packs/day for 60 years    Types: Cigarettes    Quit date: 10/08/1997  . Smokeless tobacco: Former Neurosurgeon  . Alcohol Use: No    OB History   Grav Para Term Preterm Abortions TAB SAB Ect Mult Living                  Review of Systems  Cardiovascular: Positive for chest pain.  All other systems reviewed and are negative.    Allergies  Iohexol  Home Medications   Current Outpatient Rx  Name  Route  Sig  Dispense  Refill  . albuterol (PROAIR HFA) 108 (90 BASE) MCG/ACT inhaler   Inhalation   Inhale 2 puffs into the lungs every 4 (four) hours as needed (Cough, wheeze, or chest congestion). 2 puffs up to 4 times a day as needed   1 Inhaler   5   .  albuterol (PROVENTIL) (2.5 MG/3ML) 0.083% nebulizer solution   Nebulization   Take 2.5 mg by nebulization every 6 (six) hours as needed. For shortness of breath         . ALPRAZolam (XANAX) 0.25 MG tablet   Oral   Take 1 tablet (0.25 mg total) by mouth 2 (two) times daily as needed for anxiety.   60 tablet   1   . amitriptyline (ELAVIL) 25 MG tablet   Oral   Take 1 tablet (25 mg total) by mouth at bedtime.   270 tablet   3   . amLODipine (NORVASC) 5 MG tablet   Oral   Take 1 tablet (5 mg total) by mouth daily.   90 tablet   0   . aspirin 325 MG tablet   Oral   Take 325 mg by mouth daily.          . Calcium Carbonate-Vitamin D (CALCIUM 600+D) 600-400 MG-UNIT per tablet   Oral   Take 1 tablet by mouth daily.           . cephALEXin (KEFLEX) 500 MG capsule   Oral   Take 1 capsule (500 mg total) by mouth 4 (four) times daily.   28 capsule   0   . clopidogrel  (PLAVIX) 75 MG tablet   Oral   Take 1 tablet (75 mg total) by mouth daily.   90 tablet   0   . FLUoxetine (PROZAC) 20 MG capsule   Oral   Take 1 capsule (20 mg total) by mouth daily.   30 capsule   11   . furosemide (LASIX) 40 MG tablet   Oral   Take 1 tablet (40 mg total) by mouth daily.   30 tablet   5   . glimepiride (AMARYL) 2 MG tablet   Oral   Take 1 tablet (2 mg total) by mouth daily.   30 tablet   6   . guaifenesin (MUCUS RELIEF) 400 MG TABS   Oral   Take 400 mg by mouth 2 (two) times daily as needed. For congestion         . isosorbide mononitrate (IMDUR) 60 MG 24 hr tablet   Oral   Take 90 mg by mouth daily.          . metFORMIN (GLUCOPHAGE) 1000 MG tablet   Oral   Take 1 tablet (1,000 mg total) by mouth 2 (two) times daily with a meal.   180 tablet   3   . metoprolol succinate (TOPROL-XL) 50 MG 24 hr tablet   Oral   Take 1 tablet (50 mg total) by mouth daily. Take with or immediately following a meal.   90 tablet   0     PATIENT IS DUE FOR A ROUTINE PHYSICAL   . Multiple Vitamins-Minerals (MULTIVITAL PO)   Oral   Take 1 tablet by mouth daily.          . nitroGLYCERIN (NITROSTAT) 0.4 MG SL tablet   Sublingual   Place 0.4 mg under the tongue every 5 (five) minutes as needed. For chest pain         . omeprazole (PRILOSEC) 20 MG capsule   Oral   Take 1 capsule (20 mg total) by mouth 2 (two) times daily.   60 capsule   5   . oxybutynin (DITROPAN-XL) 10 MG 24 hr tablet   Oral   Take 1 tablet (10 mg total) by mouth daily.   90 tablet  1   . oxymetazoline (AFRIN NASAL SPRAY) 0.05 % nasal spray      1 spray into right nostril if bleeding won't stop.  May repeat once.         . pentoxifylline (TRENTAL) 400 MG CR tablet   Oral   Take 1 tablet (400 mg total) by mouth 3 (three) times daily.   270 tablet   1   . potassium chloride (MICRO-K) 10 MEQ CR capsule   Oral   Take 1 capsule (10 mEq total) by mouth 2 (two) times daily.    180 capsule   1   . senna (SENOKOT) 8.6 MG tablet   Oral   Take 1 tablet by mouth daily.         . simvastatin (ZOCOR) 80 MG tablet   Oral   Take 1 tablet (80 mg total) by mouth at bedtime.   90 tablet   1     BP 170/54  Pulse 110  Temp(Src) 98.7 F (37.1 C) (Oral)  Resp 21  SpO2 100%  Physical Exam  Nursing note and vitals reviewed.  79 year old female, resting comfortably and in no acute distress. Vital signs are significant for tachycardia with heart rate 110, tachypnea with respiratory rate of 21, hypertension with blood pressure 107/54. Oxygen saturation is 100%, which is normal. Head is normocephalic and atraumatic. PERRLA, EOMI. Oropharynx is clear. Neck is nontender and supple without adenopathy or JVD. Back is nontender and there is no CVA tenderness. Lungs are clear without rales, wheezes, or rhonchi. Chest is nontender. Heart has regular rate and rhythm without murmur. Abdomen is soft, flat, nontender without masses or hepatosplenomegaly and peristalsis is normoactive. Extremities have no cyanosis or edema, full range of motion is present. Skin is warm and dry without rash. Neurologic: Mental status is normal, cranial nerves are intact, there are no motor or sensory deficits.  ED Course  Procedures (including critical care time)  Results for orders placed during the hospital encounter of 01/02/13  CBC WITH DIFFERENTIAL      Result Value Range   WBC 9.7  4.0 - 10.5 K/uL   RBC 2.88 (*) 3.87 - 5.11 MIL/uL   Hemoglobin 8.6 (*) 12.0 - 15.0 g/dL   HCT 81.1 (*) 91.4 - 78.2 %   MCV 87.8  78.0 - 100.0 fL   MCH 29.9  26.0 - 34.0 pg   MCHC 34.0  30.0 - 36.0 g/dL   RDW 95.6  21.3 - 08.6 %   Platelets 281  150 - 400 K/uL   Neutrophils Relative 78 (*) 43 - 77 %   Neutro Abs 7.6  1.7 - 7.7 K/uL   Lymphocytes Relative 12  12 - 46 %   Lymphs Abs 1.1  0.7 - 4.0 K/uL   Monocytes Relative 8  3 - 12 %   Monocytes Absolute 0.7  0.1 - 1.0 K/uL   Eosinophils Relative 2   0 - 5 %   Eosinophils Absolute 0.2  0.0 - 0.7 K/uL   Basophils Relative 1  0 - 1 %   Basophils Absolute 0.1  0.0 - 0.1 K/uL  BASIC METABOLIC PANEL      Result Value Range   Sodium 142  135 - 145 mEq/L   Potassium 4.1  3.5 - 5.1 mEq/L   Chloride 105  96 - 112 mEq/L   CO2 24  19 - 32 mEq/L   Glucose, Bld 212 (*) 70 - 99 mg/dL   BUN  27 (*) 6 - 23 mg/dL   Creatinine, Ser 1.61 (*) 0.50 - 1.10 mg/dL   Calcium 9.3  8.4 - 09.6 mg/dL   GFR calc non Af Amer 27 (*) >90 mL/min   GFR calc Af Amer 31 (*) >90 mL/min  TROPONIN I      Result Value Range   Troponin I <0.30  <0.30 ng/mL    Date: 01/02/2013  Rate: 106  Rhythm: sinus tachycardia  QRS Axis: normal  Intervals: normal  ST/T Wave abnormalities: nonspecific T wave changes  Conduction Disutrbances:none  Narrative Interpretation: Sinus tachycardia, nonspecific T wave changes. When compared with ECG of 03/20/2012, no significant changes are seen.  Old EKG Reviewed: unchanged    1. SVT (supraventricular tachycardia)   2. Angina pectoris   3. Anemia   4. Renal insufficiency       MDM  Angina pectoris secondary to episode of supraventricular tachycardia. Old records are reviewed and she does have significant COPD and atherosclerotic peripheral vascular disease as well as coronary artery disease. Since she is symptom free, troponin I will be checked now and in 3 hours.  Initial troponin is normal. Repeat troponin is pending. Case will be signed out to Dr. Arnoldo Morale to check on that result. Slight worsening of renal function is noted and this can be followed as an outpatient. There is also been a slight drop in hemoglobin over baseline which can also be followed as an outpatient.  Dione Booze, MD 01/03/13 [facility] Manners

## 2013-01-02 NOTE — ED Notes (Signed)
Per EMS: called out for CP x 2 hours, pt took her own nitro SL at home with some relief.  Also had 325 ASA PTA.  Pt initially in SVT with rate of 200.  Given 6 mg Adenosine by EMS and converted to sinus tachycardia.  CP 3/10 after conversion with radiation to her jaw.  Pt also initially c/o SOB, put on 3 L O2-pt on home O2.

## 2013-01-03 ENCOUNTER — Encounter (HOSPITAL_COMMUNITY): Payer: Self-pay | Admitting: Internal Medicine

## 2013-01-03 ENCOUNTER — Emergency Department (HOSPITAL_COMMUNITY): Payer: Medicare Other

## 2013-01-03 DIAGNOSIS — I739 Peripheral vascular disease, unspecified: Secondary | ICD-10-CM | POA: Diagnosis present

## 2013-01-03 DIAGNOSIS — I10 Essential (primary) hypertension: Secondary | ICD-10-CM | POA: Diagnosis present

## 2013-01-03 DIAGNOSIS — N179 Acute kidney failure, unspecified: Secondary | ICD-10-CM

## 2013-01-03 DIAGNOSIS — I219 Acute myocardial infarction, unspecified: Secondary | ICD-10-CM

## 2013-01-03 DIAGNOSIS — I471 Supraventricular tachycardia: Secondary | ICD-10-CM | POA: Diagnosis present

## 2013-01-03 DIAGNOSIS — I498 Other specified cardiac arrhythmias: Secondary | ICD-10-CM

## 2013-01-03 DIAGNOSIS — I214 Non-ST elevation (NSTEMI) myocardial infarction: Secondary | ICD-10-CM | POA: Diagnosis present

## 2013-01-03 LAB — MRSA PCR SCREENING: MRSA by PCR: NEGATIVE

## 2013-01-03 LAB — GLUCOSE, CAPILLARY
Glucose-Capillary: 133 mg/dL — ABNORMAL HIGH (ref 70–99)
Glucose-Capillary: 211 mg/dL — ABNORMAL HIGH (ref 70–99)
Glucose-Capillary: 169 mg/dL — ABNORMAL HIGH (ref 70–99)

## 2013-01-03 LAB — BASIC METABOLIC PANEL
BUN: 19 mg/dL (ref 6–23)
CO2: 25 mEq/L (ref 19–32)
CO2: 23 mEq/L (ref 19–32)
Chloride: 108 mEq/L (ref 96–112)
Chloride: 106 mEq/L (ref 96–112)
Creatinine, Ser: 1.55 mg/dL — ABNORMAL HIGH (ref 0.50–1.10)
Creatinine, Ser: 1.57 mg/dL — ABNORMAL HIGH (ref 0.50–1.10)
GFR calc Af Amer: 35 mL/min — ABNORMAL LOW (ref >90)
Glucose, Bld: 244 mg/dL — ABNORMAL HIGH (ref 70–99)
Potassium: 3.5 mEq/L (ref 3.5–5.1)
Potassium: 3.7 mEq/L (ref 3.5–5.1)

## 2013-01-03 LAB — CBC
HCT: 26.5 % — ABNORMAL LOW (ref 36.0–46.0)
Hemoglobin: 8.7 g/dL — ABNORMAL LOW (ref 12.0–15.0)
MCV: 88.9 fL (ref 78.0–100.0)
RBC: 2.98 MIL/uL — ABNORMAL LOW (ref 3.87–5.11)
RDW: 13.8 % (ref 11.5–15.5)
WBC: 10.9 10*3/uL — ABNORMAL HIGH (ref 4.0–10.5)

## 2013-01-03 LAB — OCCULT BLOOD, POC DEVICE: Fecal Occult Bld: NEGATIVE

## 2013-01-03 LAB — T4, FREE: Free T4: 1.01 ng/dL (ref 0.80–1.80)

## 2013-01-03 LAB — HEPARIN LEVEL (UNFRACTIONATED): Heparin Unfractionated: 0.45 [IU]/mL (ref 0.30–0.70)

## 2013-01-03 LAB — TROPONIN I: Troponin I: 0.74 ng/mL (ref <0.30)

## 2013-01-03 MED ORDER — HEPARIN (PORCINE) IN NACL 100-0.45 UNIT/ML-% IJ SOLN
1000.0000 [IU]/h | INTRAMUSCULAR | Status: DC
  Administered 2013-01-04: 1000 [IU]/h via INTRAVENOUS
  Filled 2013-01-03: qty 250

## 2013-01-03 MED ORDER — SODIUM CHLORIDE 0.9 % IV SOLN: INTRAVENOUS | Status: DC

## 2013-01-03 MED ORDER — AMLODIPINE BESYLATE 5 MG PO TABS
5.0000 mg | ORAL_TABLET | Freq: Every day | ORAL | Status: DC
  Filled 2013-01-03: qty 1

## 2013-01-03 MED ORDER — HYDROMORPHONE HCL PF 1 MG/ML IJ SOLN: 1.0000 mg | Freq: Once | INTRAMUSCULAR | Status: DC

## 2013-01-03 MED ORDER — INSULIN ASPART 100 UNIT/ML ~~LOC~~ SOLN
0.0000 [IU] | Freq: Three times a day (TID) | SUBCUTANEOUS | Status: DC
  Administered 2013-01-04: 2 [IU] via SUBCUTANEOUS
  Administered 2013-01-05 (×2): 1 [IU] via SUBCUTANEOUS
  Administered 2013-01-06: 2 [IU] via SUBCUTANEOUS
  Administered 2013-01-06 (×2): 1 [IU] via SUBCUTANEOUS
  Administered 2013-01-07: 3 [IU] via SUBCUTANEOUS
  Administered 2013-01-07 – 2013-01-08 (×3): 1 [IU] via SUBCUTANEOUS
  Administered 2013-01-09 – 2013-01-10 (×2): 2 [IU] via SUBCUTANEOUS
  Administered 2013-01-10 – 2013-01-11 (×2): 1 [IU] via SUBCUTANEOUS

## 2013-01-03 MED ORDER — ACETAMINOPHEN 500 MG PO TABS: 1000.0000 mg | ORAL_TABLET | Freq: Four times a day (QID) | ORAL | Status: DC | PRN

## 2013-01-03 MED ORDER — CLOPIDOGREL BISULFATE 75 MG PO TABS
75.0000 mg | ORAL_TABLET | Freq: Every day | ORAL | Status: DC
  Administered 2013-01-03 – 2013-01-11 (×9): 75 mg via ORAL
  Filled 2013-01-03 (×10): qty 1

## 2013-01-03 MED ORDER — ATORVASTATIN CALCIUM 40 MG PO TABS
40.0000 mg | ORAL_TABLET | Freq: Every day | ORAL | Status: DC
  Administered 2013-01-03 – 2013-01-10 (×8): 40 mg via ORAL
  Filled 2013-01-03 (×10): qty 1

## 2013-01-03 MED ORDER — ISOSORBIDE MONONITRATE ER 60 MG PO TB24
90.0000 mg | ORAL_TABLET | Freq: Every day | ORAL | Status: DC
  Administered 2013-01-03 – 2013-01-11 (×9): 90 mg via ORAL
  Filled 2013-01-03 (×9): qty 1

## 2013-01-03 MED ORDER — LABETALOL HCL 5 MG/ML IV SOLN
INTRAVENOUS | Status: AC
  Administered 2013-01-03: 20 mg
  Filled 2013-01-03: qty 4

## 2013-01-03 MED ORDER — PANTOPRAZOLE SODIUM 40 MG PO TBEC
40.0000 mg | DELAYED_RELEASE_TABLET | Freq: Every day | ORAL | Status: DC
  Administered 2013-01-03 – 2013-01-11 (×9): 40 mg via ORAL
  Filled 2013-01-03 (×9): qty 1

## 2013-01-03 MED ORDER — HEPARIN BOLUS VIA INFUSION: 60.0000 [IU]/kg | Freq: Once | INTRAVENOUS | Status: DC

## 2013-01-03 MED ORDER — ASPIRIN 325 MG PO TABS: 325.0000 mg | ORAL_TABLET | Freq: Every day | ORAL | Status: DC

## 2013-01-03 MED ORDER — ACETAMINOPHEN 650 MG RE SUPP: 650.0000 mg | Freq: Four times a day (QID) | RECTAL | Status: DC | PRN

## 2013-01-03 MED ORDER — HEPARIN (PORCINE) IN NACL 100-0.45 UNIT/ML-% IJ SOLN: 12.0000 [IU]/kg/h | Freq: Once | INTRAMUSCULAR | Status: DC

## 2013-01-03 MED ORDER — HEPARIN (PORCINE) IN NACL 100-0.45 UNIT/ML-% IJ SOLN
1000.0000 [IU]/h | INTRAMUSCULAR | Status: DC
  Administered 2013-01-03: 1000 [IU]/h via INTRAVENOUS
  Filled 2013-01-03 (×2): qty 250

## 2013-01-03 MED ORDER — ONDANSETRON HCL 4 MG/2ML IJ SOLN: 4.0000 mg | Freq: Four times a day (QID) | INTRAMUSCULAR | Status: DC | PRN

## 2013-01-03 MED ORDER — GLIMEPIRIDE 2 MG PO TABS
2.0000 mg | ORAL_TABLET | Freq: Every day | ORAL | Status: DC
  Administered 2013-01-03 – 2013-01-11 (×9): 2 mg via ORAL
  Filled 2013-01-03 (×10): qty 1

## 2013-01-03 MED ORDER — ALBUTEROL SULFATE (5 MG/ML) 0.5% IN NEBU
2.5000 mg | INHALATION_SOLUTION | RESPIRATORY_TRACT | Status: DC | PRN
  Administered 2013-01-06 – 2013-01-07 (×5): 2.5 mg via RESPIRATORY_TRACT
  Filled 2013-01-03 (×7): qty 0.5

## 2013-01-03 MED ORDER — ACETAMINOPHEN 325 MG PO TABS
650.0000 mg | ORAL_TABLET | Freq: Four times a day (QID) | ORAL | Status: DC | PRN
  Administered 2013-01-06 – 2013-01-07 (×4): 650 mg via ORAL
  Filled 2013-01-03: qty 2
  Filled 2013-01-03: qty 1
  Filled 2013-01-03 (×2): qty 2

## 2013-01-03 MED ORDER — METOPROLOL SUCCINATE ER 50 MG PO TB24
50.0000 mg | ORAL_TABLET | Freq: Every day | ORAL | Status: DC
  Administered 2013-01-03 – 2013-01-04 (×2): 50 mg via ORAL
  Filled 2013-01-03 (×3): qty 1

## 2013-01-03 MED ORDER — POTASSIUM CHLORIDE ER 10 MEQ PO CPCR: 20.0000 meq | ORAL_CAPSULE | Freq: Every day | ORAL | Status: DC

## 2013-01-03 MED ORDER — HEPARIN (PORCINE) IN NACL 100-0.45 UNIT/ML-% IJ SOLN: 12.0000 [IU]/kg/h | INTRAMUSCULAR | Status: DC

## 2013-01-03 MED ORDER — ALPRAZOLAM 0.25 MG PO TABS: 0.2500 mg | ORAL_TABLET | Freq: Two times a day (BID) | ORAL | Status: DC | PRN

## 2013-01-03 MED ORDER — ASPIRIN EC 325 MG PO TBEC
325.0000 mg | DELAYED_RELEASE_TABLET | Freq: Every day | ORAL | Status: DC
  Administered 2013-01-03 – 2013-01-09 (×7): 325 mg via ORAL
  Filled 2013-01-03 (×7): qty 1

## 2013-01-03 MED ORDER — NITROGLYCERIN 0.4 MG SL SUBL: 0.4000 mg | SUBLINGUAL_TABLET | SUBLINGUAL | Status: DC | PRN

## 2013-01-03 MED ORDER — POTASSIUM CHLORIDE CRYS ER 20 MEQ PO TBCR
20.0000 meq | EXTENDED_RELEASE_TABLET | Freq: Every day | ORAL | Status: DC
  Administered 2013-01-03 – 2013-01-11 (×9): 20 meq via ORAL
  Filled 2013-01-03 (×9): qty 1

## 2013-01-03 MED ORDER — SODIUM CHLORIDE 0.9 % IV SOLN
INTRAVENOUS | Status: DC
  Administered 2013-01-03: 75 mL/h via INTRAVENOUS
  Administered 2013-01-04 – 2013-01-06 (×3): via INTRAVENOUS

## 2013-01-03 MED ORDER — FLUOXETINE HCL 20 MG PO CAPS
20.0000 mg | ORAL_CAPSULE | Freq: Every day | ORAL | Status: DC
  Administered 2013-01-03 – 2013-01-11 (×9): 20 mg via ORAL
  Filled 2013-01-03 (×9): qty 1

## 2013-01-03 MED ORDER — AMLODIPINE BESYLATE 10 MG PO TABS
10.0000 mg | ORAL_TABLET | Freq: Every day | ORAL | Status: DC
  Administered 2013-01-03 – 2013-01-10 (×8): 10 mg via ORAL
  Filled 2013-01-03 (×8): qty 1

## 2013-01-03 MED ORDER — OXYBUTYNIN CHLORIDE ER 10 MG PO TB24
10.0000 mg | ORAL_TABLET | Freq: Every day | ORAL | Status: DC
  Administered 2013-01-03 – 2013-01-11 (×9): 10 mg via ORAL
  Filled 2013-01-03 (×10): qty 1

## 2013-01-03 MED ORDER — HEPARIN BOLUS VIA INFUSION
4000.0000 [IU] | Freq: Once | INTRAVENOUS | Status: AC
  Administered 2013-01-03: 4000 [IU] via INTRAVENOUS

## 2013-01-03 MED ORDER — ALPRAZOLAM 0.25 MG PO TABS
0.2500 mg | ORAL_TABLET | Freq: Two times a day (BID) | ORAL | Status: DC | PRN
  Administered 2013-01-07: 0.25 mg via ORAL
  Filled 2013-01-03: qty 1

## 2013-01-03 MED ORDER — ONDANSETRON HCL 4 MG PO TABS: 4.0000 mg | ORAL_TABLET | Freq: Four times a day (QID) | ORAL | Status: DC | PRN

## 2013-01-03 MED ORDER — FUROSEMIDE 40 MG PO TABS
40.0000 mg | ORAL_TABLET | Freq: Every day | ORAL | Status: DC
  Administered 2013-01-03 – 2013-01-05 (×3): 40 mg via ORAL
  Filled 2013-01-03 (×4): qty 1

## 2013-01-03 MED ORDER — NITROGLYCERIN 0.4 MG/SPRAY TL SOLN: 1.0000 | Status: DC | PRN

## 2013-01-03 MED ORDER — SODIUM CHLORIDE 0.9 % IJ SOLN
3.0000 mL | Freq: Two times a day (BID) | INTRAMUSCULAR | Status: DC
  Administered 2013-01-03 – 2013-01-10 (×13): 3 mL via INTRAVENOUS

## 2013-01-03 NOTE — ED Notes (Signed)
Spoke with Darcella Cheshire, RN and gave report.  She advised that they are in the middle of report and to try to bring patient after 0730.  I advised we would bring then.

## 2013-01-03 NOTE — Progress Notes (Signed)
ANTICOAGULATION CONSULT NOTE - Follow Up Consult  Pharmacy Consult for heparin Indication: chest pain/ACS  Allergies  Allergen Reactions  . Amitriptyline Other (See Comments)    Took along with Prozac-caused very bad shaking in hands, could not coordinate movements.    . Iohexol Rash and Other (See Comments)    Fever, unable to stand also    Patient Measurements:   Heparin Dosing Weight: 75kg  Vital Signs: Temp: 98.9 F (37.2 C) (04/30 1722) Temp src: Oral (04/30 1722) BP: 139/32 mmHg (04/30 1645) Pulse Rate: 79 (04/30 1722)  Labs:  Recent Labs  01/02/13 2027 01/02/13 2357 01/03/13 1110 01/03/13 1557 01/03/13 1558 01/03/13 1957  HGB 8.6*  --  8.7*  --   --   --   HCT 25.3*  --  26.5*  --   --   --   PLT 281  --  292  --   --   --   HEPARINUNFRC  --   --  0.45  --   --  0.38  CREATININE 1.73*  --  1.55* 1.57*  --   --   TROPONINI <0.30 0.83* 0.89*  --  0.44*  --     The CrCl is unknown because both a height and weight (above a minimum accepted value) are required for this calculation.  Assessment: 79 year old female admitted with chest pain. Initial heparin level is within desired range will recheck to confirm. Upon recheck heparin level remains therapeutic.  No bleeding or complications noted.  Order for heparin was d/c'd around 1800 tonight, but per RN drip was not stopped.  Spoke to with Dr. Herbie Baltimore around 2000 tonight, and he wishes to continue heparin, targeting the lower end of therapeutic range.  Order was re-entered.  Infusion not interrupted.  Goal of Therapy:  Heparin level 0.3-0.5 Monitor platelets by anticoagulation protocol: Yes   Plan:  Continue heparin at 1000 units/hr Daily heparin level and CBC.  Tad Moore, BCPS  Clinical Pharmacist Pager [phone]  01/03/2013 8:53 PM

## 2013-01-03 NOTE — H&P (Addendum)
[facility] History and Physical  Kenley L Sosinski BJY:[id number] DOB: 1931-11-15 DOA: 01/02/2013  Referring physician: Dr.Manly. PCP: Illene Regulus, MD  Specialists: [facility] heart and vascular.  Chief Complaint: Palpitations and chest pain.  HPI: Megan Mathews is a 79 y.o. female history of CAD status post stenting experiencing palpitations and chest pressure last evening. At that point EMS was called and patient was found to be in SVT and patient was given adenosine after which patient's rhythm reverted back to normal sinus. Patient's chest pain subsided and resolved after patient's palpitations resolved. In the ER EKG did not show any acute ST-T changes. Cardiac enzymes came positive. And on-call cardiologist for [facility] heart and vascular was contacted by the ER physician and at this time they have requested admission by hospitalist and they'll be seeing patient in consult. Patient is currently chest pain-free. Patient denies any nausea vomiting abdominal pain fever chills. Patient has anemia and denies having any blood in the stools or black stools. Chest XRAY is pending.  Review of Systems: As presented in the history of presenting illness, rest negative.  Past Medical History  Diagnosis Date  . COPD (chronic obstructive pulmonary disease)     home 02  . OSA (obstructive sleep apnea)   . Hypoxemia   . Secondary pulmonary hypertension 8/09    "severe" by echo  . CAD (coronary artery disease) '95,'96,'98.'99,'08    Multiple PCI/stenting all 3 vessels  . Diabetes mellitus type II   . Hyperlipidemia   . Hypertension   . Peripheral vascular disease '96, July 2010    AoIliac stenting with PTA for ISR 7/10  . Tobacco abuse     quit '99  . Constipation   . Anxiety   . Depression    Past Surgical History  Procedure Laterality Date  . Carotid endarterectomy      bilat with redo on Lt  . Total abdominal hysterectomy    . Aaa stent  10/96  . Bilateral iliac stenting   10/96, 7/10  . Resection of vocal chord lesions    . Excision serous cyst adenofibroma    . Coronary angioplasty  2/95    RCA  . Coronary angioplasty with stent placement  8/96    RCA  . Coronary angioplasty with stent placement  5/98    RCA X 2  . Coronary angioplasty with stent placement  11/99    CFX,LAD,Dx  . Coronary angiogram  12/08    patent stents, 40% LM   Social History:  reports that she quit smoking about 15 years ago. Her smoking use included Cigarettes. She has a 60 pack-year smoking history. She has quit using smokeless tobacco. She reports that she does not drink alcohol or use illicit drugs. Lives at home. where does patient live-- Can do ADLs. Can patient participate in ADLs?  Allergies  Allergen Reactions  . Amitriptyline Other (See Comments)    Took along with Prozac-caused very bad shaking in hands, could not coordinate movements.    . Iohexol Rash and Other (See Comments)    Fever, unable to stand also    History reviewed. No pertinent family history.    Prior to Admission medications   Medication Sig Start Date End Date Taking? Authorizing Provider  acetaminophen (TYLENOL) 500 MG tablet Take 1,000 mg by mouth every 6 (six) hours as needed for pain.   Yes Historical Provider, MD  albuterol (PROAIR HFA) 108 (90 BASE) MCG/ACT inhaler Inhale 2 puffs into the lungs every 4 (  four) hours as needed (Cough, wheeze, or chest congestion). 2 puffs up to 4 times a day as needed 11/30/11  Yes Coralyn Helling, MD  albuterol (PROVENTIL) (2.5 MG/3ML) 0.083% nebulizer solution Take 2.5 mg by nebulization every 6 (six) hours as needed. For shortness of breath   Yes Historical Provider, MD  ALPRAZolam (XANAX) 0.25 MG tablet Take 0.25 mg by mouth 2 (two) times daily as needed for anxiety. 08/29/12 08/29/13 Yes Jacques Navy, MD  amLODipine (NORVASC) 5 MG tablet Take 1 tablet (5 mg total) by mouth daily. 12/29/12  Yes Jacques Navy, MD  aspirin 325 MG tablet Take 325 mg by mouth  daily.    Yes Historical Provider, MD  Calcium Carbonate-Vitamin D (CALCIUM 600+D) 600-400 MG-UNIT per tablet Take 1 tablet by mouth daily.     Yes Historical Provider, MD  clopidogrel (PLAVIX) 75 MG tablet Take 1 tablet (75 mg total) by mouth daily. 12/29/12  Yes Jacques Navy, MD  FLUoxetine (PROZAC) 20 MG capsule Take 1 capsule (20 mg total) by mouth daily. 11/08/12  Yes Jacques Navy, MD  furosemide (LASIX) 40 MG tablet Take 1 tablet (40 mg total) by mouth daily. 07/10/12  Yes Jacques Navy, MD  glimepiride (AMARYL) 2 MG tablet Take 1 tablet (2 mg total) by mouth daily. 10/30/12  Yes Jacques Navy, MD  guaifenesin (MUCUS RELIEF) 400 MG TABS Take 400 mg by mouth 2 (two) times daily as needed. For congestion   Yes Historical Provider, MD  isosorbide mononitrate (IMDUR) 60 MG 24 hr tablet Take 90 mg by mouth daily.    Yes Historical Provider, MD  metFORMIN (GLUCOPHAGE) 1000 MG tablet Take 1 tablet (1,000 mg total) by mouth 2 (two) times daily with a meal. 06/28/12  Yes Jacques Navy, MD  metoprolol succinate (TOPROL-XL) 50 MG 24 hr tablet Take 1 tablet (50 mg total) by mouth daily. Take with or immediately following a meal. 01/01/13  Yes Jacques Navy, MD  Multiple Vitamins-Minerals (MULTIVITAL PO) Take 1 tablet by mouth daily.    Yes Historical Provider, MD  nitroGLYCERIN (NITROLINGUAL) 0.4 MG/SPRAY spray Place 1 spray under the tongue every 5 (five) minutes as needed for chest pain.   Yes Historical Provider, MD  oxybutynin (DITROPAN-XL) 10 MG 24 hr tablet Take 1 tablet (10 mg total) by mouth daily. 10/02/12  Yes Jacques Navy, MD  potassium chloride (MICRO-K) 10 MEQ CR capsule Take 20 mEq by mouth daily.   Yes Historical Provider, MD  simvastatin (ZOCOR) 80 MG tablet Take 1 tablet (80 mg total) by mouth at bedtime. 05/26/12  Yes Jacques Navy, MD  omeprazole (PRILOSEC) 20 MG capsule Take 1 capsule (20 mg total) by mouth 2 (two) times daily. 10/31/12 10/31/13  Jacques Navy, MD   oxymetazoline (AFRIN NASAL SPRAY) 0.05 % nasal spray 1 spray into right nostril if bleeding won't stop.  May repeat once. 04/24/12   Lollie Sails, MD   Physical Exam: Filed Vitals:   01/02/13 2245 01/03/13 0015 01/03/13 0100 01/03/13 0145  BP: 153/66 164/57 166/57 169/47  Pulse:   91 93  Temp:      TempSrc:      Resp: 20 18 20 20   SpO2:   100% 100%     General:  Well-developed and nourished.  Eyes: Anicteric no pallor.  ENT: No discharge from the ears eyes nose mouth.  Neck: No mass felt.  Cardiovascular: S1-S2 heard.  Respiratory: No rhonchi or crepitations.  Abdomen: Soft nontender bowel sounds present.  Skin: No rash.  Musculoskeletal: No edema.  Psychiatric: Appears normal.  Neurologic: Alert awake oriented to time place and person. Moves all extremities.  Labs on Admission:  Basic Metabolic Panel:  Recent Labs Lab 01/02/13 2027  NA 142  K 4.1  CL 105  CO2 24  GLUCOSE 212*  BUN 27*  CREATININE 1.73*  CALCIUM 9.3   Liver Function Tests: No results found for this basename: AST, ALT, ALKPHOS, BILITOT, PROT, ALBUMIN,  in the last 168 hours No results found for this basename: LIPASE, AMYLASE,  in the last 168 hours No results found for this basename: AMMONIA,  in the last 168 hours CBC:  Recent Labs Lab 01/02/13 2027  WBC 9.7  NEUTROABS 7.6  HGB 8.6*  HCT 25.3*  MCV 87.8  PLT 281   Cardiac Enzymes:  Recent Labs Lab 01/02/13 2027 01/02/13 2357  TROPONINI <0.30 0.83*    BNP (last 3 results) No results found for this basename: PROBNP,  in the last 8760 hours CBG: No results found for this basename: GLUCAP,  in the last 168 hours  Radiological Exams on Admission: No results found.  EKG: Independently reviewed. Sinus tachycardia.  Assessment/Plan Principal Problem:   Non-ST elevation MI (NSTEMI) Active Problems:   OBSTRUCTIVE SLEEP APNEA   COPD   Anemia   Type II or unspecified type diabetes mellitus without mention of  complication, uncontrolled   SVT (supraventricular tachycardia)   ARF (acute renal failure)   1. Non-ST elevation MI - cardiologist to see patient in consult. Patient is presently chest pain-free. We'll start patient on heparin infusion given the history of stents. Cycle cardiac markers. We'll keep patient n.p.o. in anticipation of possible cardiac procedures. 2. SVT - presently in sinus rhythm. Check thyroid function tests. 3. Anemia - patient's hemoglobin has decreased from previous. Check stool for occult blood and closely follow CBC. Patient denies having taking any NSAIDs or have noticed any blood in the stools or black stools. 4. Diabetes mellitus type 2 - closely follow CBGs. Patient is n.p.o. presently in anticipation of possible cardiac procedures. 5. OSA on BiPAP - BiPAP her respiratory. 6. Acute renal failure - closely follow metabolic panel intake and output. Check UA. We'll hold off Lasix if there is any further worsening of creatinine. 7. Hyperlipidemia - continue statins.    Code Status: Full code.  Family Communication: None.  Disposition Plan: Admit to inpatient.    KAKRAKANDY,ARSHAD N. [facility] Pager [phone].  If 7PM-7AM, please contact night-coverage [email or web address] Password [email or web address] 01/03/2013, 3:06 AM Addendum - Chest xray does not show anything acute.  Midge Minium.

## 2013-01-03 NOTE — Care Management Note (Signed)
    Page 1 of 2   01/11/2013     2:50:05 PM   CARE MANAGEMENT NOTE 01/11/2013  Patient:  [facility]   Account Number:  [id number]  Date Initiated:  01/03/2013  Documentation initiated by:  Junius Creamer  Subjective/Objective Assessment:   adm w mi     Action/Plan:   lives alone, pcp dr Casimiro Needle norins   Anticipated DC Date:  01/11/2013   Anticipated DC Plan:  HOME W HOME HEALTH SERVICES  In-house referral  Financial Counselor      DC Planning Services  CM consult      [facility] Choice  HOME HEALTH   Choice offered to / List presented to:  C-1 Patient        HH arranged  HH-1 RN  HH-10 DISEASE MANAGEMENT  HH-2 PT  HH-4 NURSE'S AIDE  HH-6 SOCIAL WORKER      HH agency  [facility]   Status of service:  Completed, signed off Medicare Important Message given?   (If response is "NO", the following Medicare IM given date fields will be blank) Date Medicare IM given:   Date Additional Medicare IM given:    Discharge Disposition:  HOME W HOME HEALTH SERVICES  Per UR Regulation:  Reviewed for med. necessity/level of care/duration of stay  If discussed at Long Length of Stay Meetings, dates discussed:   01/09/2013  01/11/2013    Comments:  01/11/13 Prudie Guthridge,RN,BSN [phone] DISCUSSED DC PLANS WITH DAUGHTER SUSIE [phone]).  DTR STATES PT LIVES ALONE, NEEDS ASSISTANCE AT HOME.  SHE IS AGREEABLE TO HH CARE, AS IS PT. CHOOSES [facility] FOR HH FOLLOW UP.  SHE STATES PT ALSO NEEDS PRIVATE DUTY SERVICES FOR HOUSEKEEPING, LAUNDRY.  WILL LEAVE PRIVATE DUTY LIST WITH PT, ADD CSW TO HH ORDERS.  DTR STATES PT HAD MEDICAID AT ONE TIME AND PCS AIDE, BUT CURRENTLY DOES NOT HAVE.  SHE PLANS ON REAPPLYING FOR [facility].  CSW AT HOME CAN ASSIST WITH APPLICATION.  REFERRAL TO [facility]; START OF CARE 24-48H POST DC DATE.  01/09/13 Lenorris Karger,RN,BSN [phone] PTA, PT LIVES ALONE, HAS HOME OXYGEN THROUGH [facility].  P.T EVAL COMPLETE:  RECOMMENDING HH FOLLOW UP AT DC.  WOULD RECOMMEND HHRN FOR DISEASE  MANAGEMENT OF COPD AND HHPT. MD, PLEASE LEAVE ORDERS, IF YOU AGREE.  5/5 1433 debbie dowell rn,bsn await phy ther eval. spoke w nse and md to get phy ther eval. sedentary pta per chart. transf to med floor prior to being seen by therapy. sw ref made and cm will cont to follow and assist as pt progresses and phy ther advises.  4/30 0903 debbie dowell rn,bsn

## 2013-01-03 NOTE — ED Notes (Signed)
Tried to call report to [facility], Charity fundraiser.  She asked me to call Ramana, RN to give report.  Ramana was unavailable, but to callback for report.

## 2013-01-03 NOTE — Consult Note (Addendum)
Reason for Consult: NSTEI Referring Physician: TRH    HPI:  The patient is a 79 y/o female, followed at [facility] by Dr. Allyson Sabal - last seen in 11/2012.    She has a history of CAD and PVOD.  Cath Nov 2008-- 40% tapered distal left main stenosis but otherwise noncritical CAD. She did have patent stents to her LAD, circumflex and mid and distal RCA with normal LV function.   Known renal artery stenosis, R>L, and  distal abdominal aorta and bilateral iliac stenting which were patent at the time of her catheterization. She underwent bilateral Iliac Artery reintervention March 30, 2009.   Her last Dopplers performed a year and a half ago showed progressive in-stent restenosis and she does complain of lifestyle-limiting claudication.   She has also had bilateral carotid endarterectomies with re-do on the left.   Her other problems include hypertension, hyperlipidemia, insulin-dependent diabetes and GERD.    She presented to [facility] on 01/02/13 with complaints of palpitations and chest pressure. She reports  angina off and on for past several months, but states that yesterday's episode was more severe. It was pressure like substernal discomfort with radiation to both upper extremities. The pain was not relieved with nitroglycerin. She denies associated SOB, n/v, diaphoresis, syncope/presyncope. She was brought to the ER by EMS. When they arrived to her home she was found to be in SVT. The patient was given adenosine. She converted back to NSR and her chest pain subsided and palpitations resolved. Her EKG in the ER did not show any acute changes. Her initial troponin was negative, but the second was positive at 0.83. She was admitted by [facility] and placed on IV heparin. She denies any further chest discomfort or palpitations.    Past Medical History  Diagnosis Date  . COPD (chronic obstructive pulmonary disease)     home 02  . OSA (obstructive sleep apnea)   . Hypoxemia   . Secondary pulmonary hypertension 8/09     "severe" by echo  . CAD (coronary artery disease) '95,'96,'98.'99,'08    Multiple PCI/stenting all 3 vessels  . Diabetes mellitus type II   . Hyperlipidemia   . Hypertension   . Peripheral vascular disease '96, July 2010    AoIliac stenting with PTA for ISR 7/10  . Tobacco abuse     quit '99  . Constipation   . Anxiety   . Depression   . Artery stenosis     renal   bilateral    Past Surgical History  Procedure Laterality Date  . Carotid endarterectomy      bilat with redo on Lt  . Total abdominal hysterectomy    . Aaa stent  10/96  . Bilateral iliac stenting  10/96, 7/10  . Resection of vocal chord lesions    . Excision serous cyst adenofibroma    . Coronary angioplasty  2/95    RCA  . Coronary angioplasty with stent placement  8/96    RCA  . Coronary angioplasty with stent placement  5/98    RCA X 2  . Coronary angioplasty with stent placement  11/99    CFX,LAD,Dx  . Coronary angiogram  12/08    patent stents, 40% LM    History reviewed. No pertinent family history.  Social History:  reports that she quit smoking about 15 years ago. Her smoking use included Cigarettes. She has a 60 pack-year smoking history. She has quit using smokeless tobacco. She reports that she does not drink alcohol or  use illicit drugs.  Allergies:  Allergies  Allergen Reactions  . Amitriptyline Other (See Comments)    Took along with Prozac-caused very bad shaking in hands, could not coordinate movements.    . Iohexol Rash and Other (See Comments)    Fever, unable to stand also    Medications: Prior to Admission:  Prescriptions prior to admission  Medication Sig Dispense Refill  . acetaminophen (TYLENOL) 500 MG tablet Take 1,000 mg by mouth every 6 (six) hours as needed for pain.      Marland Kitchen albuterol (PROAIR HFA) 108 (90 BASE) MCG/ACT inhaler Inhale 2 puffs into the lungs every 4 (four) hours as needed (Cough, wheeze, or chest congestion). 2 puffs up to 4 times a day as needed  1 Inhaler  5   . albuterol (PROVENTIL) (2.5 MG/3ML) 0.083% nebulizer solution Take 2.5 mg by nebulization every 6 (six) hours as needed. For shortness of breath      . ALPRAZolam (XANAX) 0.25 MG tablet Take 0.25 mg by mouth 2 (two) times daily as needed for anxiety.      Marland Kitchen amLODipine (NORVASC) 5 MG tablet Take 1 tablet (5 mg total) by mouth daily.  90 tablet  0  . aspirin 325 MG tablet Take 325 mg by mouth daily.       . Calcium Carbonate-Vitamin D (CALCIUM 600+D) 600-400 MG-UNIT per tablet Take 1 tablet by mouth daily.        . clopidogrel (PLAVIX) 75 MG tablet Take 1 tablet (75 mg total) by mouth daily.  90 tablet  0  . FLUoxetine (PROZAC) 20 MG capsule Take 1 capsule (20 mg total) by mouth daily.  30 capsule  11  . furosemide (LASIX) 40 MG tablet Take 1 tablet (40 mg total) by mouth daily.  30 tablet  5  . glimepiride (AMARYL) 2 MG tablet Take 1 tablet (2 mg total) by mouth daily.  30 tablet  6  . guaifenesin (MUCUS RELIEF) 400 MG TABS Take 400 mg by mouth 2 (two) times daily as needed. For congestion      . isosorbide mononitrate (IMDUR) 60 MG 24 hr tablet Take 90 mg by mouth daily.       . metFORMIN (GLUCOPHAGE) 1000 MG tablet Take 1 tablet (1,000 mg total) by mouth 2 (two) times daily with a meal.  180 tablet  3  . metoprolol succinate (TOPROL-XL) 50 MG 24 hr tablet Take 1 tablet (50 mg total) by mouth daily. Take with or immediately following a meal.  90 tablet  0  . Multiple Vitamins-Minerals (MULTIVITAL PO) Take 1 tablet by mouth daily.       . nitroGLYCERIN (NITROLINGUAL) 0.4 MG/SPRAY spray Place 1 spray under the tongue every 5 (five) minutes as needed for chest pain.      Marland Kitchen oxybutynin (DITROPAN-XL) 10 MG 24 hr tablet Take 1 tablet (10 mg total) by mouth daily.  90 tablet  1  . potassium chloride (MICRO-K) 10 MEQ CR capsule Take 20 mEq by mouth daily.      . simvastatin (ZOCOR) 80 MG tablet Take 1 tablet (80 mg total) by mouth at bedtime.  90 tablet  1  . omeprazole (PRILOSEC) 20 MG capsule Take 1  capsule (20 mg total) by mouth 2 (two) times daily.  60 capsule  5  . oxymetazoline (AFRIN NASAL SPRAY) 0.05 % nasal spray 1 spray into right nostril if bleeding won't stop.  May repeat once.        Results for orders  placed during the hospital encounter of 01/02/13 (from the past 48 hour(s))  CBC WITH DIFFERENTIAL     Status: Abnormal   Collection Time    01/02/13  8:27 PM      Result Value Range   WBC 9.7  4.0 - 10.5 K/uL   RBC 2.88 (*) 3.87 - 5.11 MIL/uL   Hemoglobin 8.6 (*) 12.0 - 15.0 g/dL   HCT 62.1 (*) 30.8 - 65.7 %   MCV 87.8  78.0 - 100.0 fL   MCH 29.9  26.0 - 34.0 pg   MCHC 34.0  30.0 - 36.0 g/dL   RDW 84.6  96.2 - 95.2 %   Platelets 281  150 - 400 K/uL   Neutrophils Relative 78 (*) 43 - 77 %   Neutro Abs 7.6  1.7 - 7.7 K/uL   Lymphocytes Relative 12  12 - 46 %   Lymphs Abs 1.1  0.7 - 4.0 K/uL   Monocytes Relative 8  3 - 12 %   Monocytes Absolute 0.7  0.1 - 1.0 K/uL   Eosinophils Relative 2  0 - 5 %   Eosinophils Absolute 0.2  0.0 - 0.7 K/uL   Basophils Relative 1  0 - 1 %   Basophils Absolute 0.1  0.0 - 0.1 K/uL  BASIC METABOLIC PANEL     Status: Abnormal   Collection Time    01/02/13  8:27 PM      Result Value Range   Sodium 142  135 - 145 mEq/L   Potassium 4.1  3.5 - 5.1 mEq/L   Chloride 105  96 - 112 mEq/L   CO2 24  19 - 32 mEq/L   Glucose, Bld 212 (*) 70 - 99 mg/dL   BUN 27 (*) 6 - 23 mg/dL   Creatinine, Ser 8.41 (*) 0.50 - 1.10 mg/dL   Calcium 9.3  8.4 - 32.4 mg/dL   GFR calc non Af Amer 27 (*) >90 mL/min   GFR calc Af Amer 31 (*) >90 mL/min  TROPONIN I     Status: None   Collection Time    01/02/13  8:27 PM      Result Value Range   Troponin I <0.30  <0.30 ng/mL  TROPONIN I     Status: Abnormal   Collection Time    01/02/13 11:57 PM      Result Value Range   Troponin I 0.83 (*) <0.30 ng/mL     CRITICAL RESULT CALLED TO, READ BACK BY AND VERIFIED WITH:     K.ROBINSON RN [id number] 01/03/13 E.GADDY  OCCULT BLOOD, POC DEVICE     Status: None   Collection  Time    01/03/13  3:49 AM      Result Value Range   Fecal Occult Bld NEGATIVE  NEGATIVE  GLUCOSE, CAPILLARY     Status: Abnormal   Collection Time    01/03/13  8:36 AM      Result Value Range   Glucose-Capillary 161 (*) 70 - 99 mg/dL  MRSA PCR SCREENING     Status: None   Collection Time    01/03/13  8:37 AM      Result Value Range   MRSA by PCR NEGATIVE  NEGATIVE    Dg Chest 2 View  01/03/2013  *RADIOLOGY REPORT*  Clinical Data: Pain and palpitations.  History of prior heart attacks.  CHEST - 2 VIEW  Comparison: 04/27/2011  Findings: Cardiac enlargement with mild pulmonary vascular congestion.  No edema.  No focal  consolidation.  No pneumothorax. Mediastinal contours appear intact.  Calcification of the aorta. Surgical clips in the base of the neck.  Degenerative changes in the spine.  No significant change since previous study.  IMPRESSION: Cardiac enlargement with mild pulmonary vascular congestion.  No edema or consolidation.   Original Report Authenticated By: Burman Nieves, M.D.     Review of Systems  Constitutional: Negative for diaphoresis.  Respiratory: Negative for shortness of breath.   Cardiovascular: Positive for chest pain, palpitations and claudication. Negative for orthopnea, leg swelling and PND.  Gastrointestinal: Negative for nausea, vomiting, blood in stool and melena.  Genitourinary: Negative for hematuria.  Neurological: Negative for dizziness and loss of consciousness.   Blood pressure 161/43, pulse 75, temperature 98.6 F (37 C), temperature source Oral, resp. rate 16, SpO2 99.00%. Physical Exam  Constitutional: She is oriented to person, place, and time. She appears well-developed and well-nourished. No distress.  Obese   HENT:  Head: Normocephalic and atraumatic.  Eyes: EOM are normal. Pupils are equal, round, and reactive to light.  Neck: No JVD present. Carotid bruit is present (bilateral).  Cardiovascular: Normal rate and regular rhythm.  Exam  reveals no gallop and no friction rub.   Murmur heard.  Crescendo decrescendo systolic murmur is present with a grade of 2/6  Pulses:      Radial pulses are 2+ on the right side, and 2+ on the left side.       Dorsalis pedis pulses are 2+ on the right side, and 0 on the left side.  Respiratory: Effort normal and breath sounds normal. No respiratory distress. She has no wheezes. She has no rales.  GI: Soft. Bowel sounds are normal. She exhibits no distension and no mass. There is no tenderness.  Musculoskeletal: She exhibits no edema.  Neurological: She is alert and oriented to person, place, and time.  Skin: Skin is warm and dry. She is not diaphoretic.  Psychiatric: She has a normal mood and affect. Her behavior is normal.    Assessment/Plan: Principal Problem:   Non-ST elevation MI (NSTEMI) Active Problems:   OBSTRUCTIVE SLEEP APNEA   COPD   Anemia   Type II or unspecified type diabetes mellitus without mention of complication, uncontrolled   SVT (supraventricular tachycardia)   ARF (acute renal failure)   HTN (hypertension)   HLD (hyperlipidemia)   PVD (peripheral vascular disease)  Plan: Admitted for NSTEMI with troponin of 0.83. Currently CP free. Keep on IV Heparin. She is hypertensive with SPB of 195. She has not yet received her am meds. She is on 50 mg of Toprol and 5 mg of amlodipine. Will give 10 mg IV Labetalol. Her last heart cath was in 2008. Will need to re-study. SCr. is 1.73. Will need to hydrate with IV fluids and get blood pressure under control before undergoing cardiac catheterization. PT currently in NSR. TSH was ordered due to SVT. Results pending.  Allayne Butcher, PA-C 01/03/2013, 11:27 AM   I have seen and evaluated the patient this AM along with Boyce Medici, or Wilburt Finlay, PA. I agree with her findings, examination as well as impression recommendations.  After a very long discussion with the patient & communicating with Dr. Allyson Sabal, we have  developed a plan. Essentially a 79 y/o woman with extensive PAD &CAD (stents + moderate LM disease) with what sounds like worsening angina & claudication from Dr. Hazle Coca last clinic note (plan was Carotid, Abd & LEA dopplers, Echo for Ao murmur, & Lexiscan Myoview).  She did not go through with Myoview or Echo & LEA dopplers are up-coming. She presented with significant Anginal CP in the setting of SVT, easily converted with adenosine.  Troponin elevation may be due to Supply vs. Demand ischemic myocardial necrosis (Type 2 mI) & not due to ACS related plaque rupture (Type 1 MI), but we cannot be certain.  Both Dr. Allyson Sabal & I do feel that this is essentially a "failed stress test" & precludes the need for Myoview/Cardiolite ST & would prefer proceeding with invasive evaluation;  Unfortunately, her Cr of 1.7 is above her previously recorded "normal", will hydrate gently o/n & if improved, will proceed with Radial approach cath (she is concerned re: PSA at time of last cath).  Will keep on Heparin until we see the Troponin trend.  --> if decreasing, can d/c (esp in light of Hgb drop).  Will need to figure out game plan if LM stenosis is found -> she is not in favor of CABG  Is on ASA & Plavix as well as statin  NPO after MN for possible cath in AM  Anemic on this AM blood draw --> no sign of bleeding, will check stool quaiac.  On PPI  BP is elevated - better with Labetalol.  Will increase amlodipine.  Continue home DM regimen + SSI  I spent roughly 1hr 15 min discussing findings & plan of care as well as counseling.   She is very hard of hearing & I am not sure of how much she understands.  Dispo:  She has not come to a clear decision re: Code Status -- states that someone came out to her house to talk about it & asked her to sign something saying no CPR (as best I can tell, she has not signed anything & may simply not understand.  Even after >62min, she did not fully comprehend.)  Will take  over care.  Appreciate TRH assistance.  Marykay Lex, M.D., M.S. [facility] [address] [place], [place]  [address]  [phone] Pager # [phone] 01/03/2013 12:44 PM

## 2013-01-03 NOTE — ED Provider Notes (Addendum)
Follow-up on sign out from Dr. Preston Fleeting at change of shift. Elderly vasculopathic patient with 02 dependent COPD who is s/p approx 2 hr of SVT associated with chest pain. S/P conversion to sinus rhythm en route by EMS. No pain since then. First troponin wnl but second troponin is elevated at 0.83. Patient remains pain free. She had ASA PTA. Had usual dose of BB this morning. Notes daily angina which has increased in frequency and severity over the past 2-3 months. Followed by Dr. Allyson Sabal of [facility].   I have paged [facility] to request admission for monitoring and cardiology consultation.   Brandt Loosen, MD 01/03/13 0100  Case discussed with Dr. Toniann Fail who requested that I consult Cardiology. Case discussed with Dr. Herbie Baltimore who agrees with ED management. As the patient is stable and has not experienced any chest pain since she converted to sinus rhythm, he does not feel that urgent consultation is indicated.   The patient is noted to have a normocytic anemia with Hgb of 8.3 which is a departure from her baseline. We will hemeoccult prior to starting anticoagulation with heparin.   Brandt Loosen, MD 01/03/13 0120

## 2013-01-03 NOTE — Progress Notes (Signed)
ANTICOAGULATION CONSULT NOTE - Follow Up Consult  Pharmacy Consult for heparin Indication: chest pain/ACS  Allergies  Allergen Reactions  . Amitriptyline Other (See Comments)    Took along with Prozac-caused very bad shaking in hands, could not coordinate movements.    . Iohexol Rash and Other (See Comments)    Fever, unable to stand also    Patient Measurements:   Heparin Dosing Weight: 75kg  Vital Signs: Temp: 99.2 F (37.3 C) (04/30 1221) Temp src: Oral (04/30 1221) BP: 141/46 mmHg (04/30 1221) Pulse Rate: 85 (04/30 1221)  Labs:  Recent Labs  01/02/13 2027 01/02/13 2357 01/03/13 1110  HGB 8.6*  --  8.7*  HCT 25.3*  --  26.5*  PLT 281  --  292  HEPARINUNFRC  --   --  0.45  CREATININE 1.73*  --  1.55*  TROPONINI <0.30 0.83*  --     The CrCl is unknown because both a height and weight (above a minimum accepted value) are required for this calculation.  Assessment: 79 year old female admitted with chest pain. Initial heparin level is within desired range will recheck to confirm. Plan for cath tomorrow if renal function improves.   Goal of Therapy:  Heparin level 0.3-0.7 units/ml Monitor platelets by anticoagulation protocol: Yes   Plan:  Continue heparin at 1000 units/hr Recheck heparin level tonight to confirm then daily  Sheppard Coil PharmD., BCPS Clinical Pharmacist Pager [phone] 01/03/2013 1:59 PM

## 2013-01-03 NOTE — Progress Notes (Signed)
ANTICOAGULATION CONSULT NOTE - Initial Consult  Pharmacy Consult for heparin Indication: chest pain/ACS  Allergies  Allergen Reactions  . Amitriptyline Other (See Comments)    Took along with Prozac-caused very bad shaking in hands, could not coordinate movements.    . Iohexol Rash and Other (See Comments)    Fever, unable to stand also    Patient Measurements: Heparin Dosing Weight: 75kg  Vital Signs: Temp: 98.7 F (37.1 C) (04/29 2009) Temp src: Oral (04/29 2009) BP: 177/65 mmHg (04/30 0315) Pulse Rate: 93 (04/30 0145)  Labs:  Recent Labs  01/02/13 2027 01/02/13 2357  HGB 8.6*  --   HCT 25.3*  --   PLT 281  --   CREATININE 1.73*  --   TROPONINI <0.30 0.83*    Medical History: Past Medical History  Diagnosis Date  . COPD (chronic obstructive pulmonary disease)     home 02  . OSA (obstructive sleep apnea)   . Hypoxemia   . Secondary pulmonary hypertension 8/09    "severe" by echo  . CAD (coronary artery disease) '95,'96,'98.'99,'08    Multiple PCI/stenting all 3 vessels  . Diabetes mellitus type II   . Hyperlipidemia   . Hypertension   . Peripheral vascular disease '96, July 2010    AoIliac stenting with PTA for ISR 7/10  . Tobacco abuse     quit '99  . Constipation   . Anxiety   . Depression     Assessment: 79yo female c/o paliptations and chest pressure last pm, EMS found pt to be in SVT, converted w/ adenosine, no EKG changes but positive troponin, to begin heparin.  Goal of Therapy:  Heparin level 0.3-0.7 units/ml Monitor platelets by anticoagulation protocol: Yes   Plan:  Will give heparin 4000 units IV bolus x1 followed by gtt at 1000 units/hr and monitor heparin levels and CBC.  Vernard Gambles, PharmD, BCPS  01/03/2013,4:00 AM

## 2013-01-03 NOTE — ED Notes (Signed)
Megan Mathews (daughter) telephone- [phone]

## 2013-01-03 NOTE — ED Notes (Signed)
Patient returned from radiology

## 2013-01-03 NOTE — ED Notes (Signed)
EDP Manly aware of troponin 0.83

## 2013-01-04 ENCOUNTER — Encounter (HOSPITAL_COMMUNITY)
Admission: EM | Disposition: A | Discharge status: Home Health | Payer: Self-pay | Source: Home / Self Care | Attending: Cardiology

## 2013-01-04 ENCOUNTER — Other Ambulatory Visit: Payer: Self-pay

## 2013-01-04 LAB — CBC
MCV: 89.8 fL (ref 78.0–100.0)
Platelets: 271 10*3/uL (ref 150–400)
RBC: 2.55 MIL/uL — ABNORMAL LOW (ref 3.87–5.11)
RDW: 13.9 % (ref 11.5–15.5)
WBC: 8.5 10*3/uL (ref 4.0–10.5)

## 2013-01-04 LAB — BASIC METABOLIC PANEL
CO2: 25 mEq/L (ref 19–32)
Calcium: 8.8 mg/dL (ref 8.4–10.5)
Chloride: 109 mEq/L (ref 96–112)
Creatinine, Ser: 1.72 mg/dL — ABNORMAL HIGH (ref 0.50–1.10)
GFR calc Af Amer: 32 mL/min — ABNORMAL LOW (ref >90)
Sodium: 144 mEq/L (ref 135–145)

## 2013-01-04 LAB — PROTIME-INR
INR: 1.14 (ref 0.00–1.49)
Prothrombin Time: 14.4 seconds (ref 11.6–15.2)

## 2013-01-04 LAB — GLUCOSE, CAPILLARY: Glucose-Capillary: 117 mg/dL — ABNORMAL HIGH (ref 70–99)

## 2013-01-04 SURGERY — LEFT HEART CATHETERIZATION WITH CORONARY ANGIOGRAM
Anesthesia: LOCAL

## 2013-01-04 MED ORDER — FERROUS SULFATE 325 (65 FE) MG PO TABS
325.0000 mg | ORAL_TABLET | Freq: Three times a day (TID) | ORAL | Status: DC
  Administered 2013-01-04 – 2013-01-07 (×10): 325 mg via ORAL
  Filled 2013-01-04 (×12): qty 1

## 2013-01-04 MED ORDER — HEPARIN SODIUM (PORCINE) 5000 UNIT/ML IJ SOLN
5000.0000 [IU] | Freq: Three times a day (TID) | INTRAMUSCULAR | Status: DC
  Administered 2013-01-04 – 2013-01-11 (×20): 5000 [IU] via SUBCUTANEOUS
  Filled 2013-01-04 (×25): qty 1

## 2013-01-04 NOTE — Progress Notes (Signed)
[facility]  Subjective: No complaints. Denies CP & SOB.   Objective: Vital signs in last 24 hours: Temp:  [98.2 F (36.8 C)-99.3 F (37.4 C)] 98.9 F (37.2 C) (05/01 0750) Pulse Rate:  [75-93] 93 (05/01 0300) Resp:  [11-29] 22 (05/01 0750) BP: (139-220)/(32-87) 167/87 mmHg (05/01 0750) SpO2:  [97 %-100 %] 100 % (05/01 0750) Weight:  [172 lb 2.9 oz (78.1 kg)] 172 lb 2.9 oz (78.1 kg) (05/01 0530) Last BM Date: 01/02/13  Intake/Output from previous day: 04/30 0701 - 05/01 0700 In: 2010.1 [P.O.:360; I.V.:1650.1] Out: 1950 [Urine:1950] Intake/Output this shift:    Medications Current Facility-Administered Medications  Medication Dose Route Frequency Provider Last Rate Last Dose  . 0.9 %  sodium chloride infusion   Intravenous Continuous Eduard Clos, MD      . 0.9 %  sodium chloride infusion   Intravenous Continuous Brittainy Simmons, PA-C 75 mL/hr at 01/04/13 0052    . acetaminophen (TYLENOL) tablet 650 mg  650 mg Oral Q6H PRN Eduard Clos, MD       Or  . acetaminophen (TYLENOL) suppository 650 mg  650 mg Rectal Q6H PRN Eduard Clos, MD      . albuterol (PROVENTIL) (5 MG/ML) 0.5% nebulizer solution 2.5 mg  2.5 mg Nebulization Q4H PRN Eduard Clos, MD      . ALPRAZolam Prudy Feeler) tablet 0.25 mg  0.25 mg Oral BID PRN Eduard Clos, MD      . amLODipine (NORVASC) tablet 10 mg  10 mg Oral Daily Marykay Lex, MD   10 mg at 01/03/13 1100  . aspirin EC tablet 325 mg  325 mg Oral Daily Eduard Clos, MD   325 mg at 01/03/13 1200  . atorvastatin (LIPITOR) tablet 40 mg  40 mg Oral QHS Eduard Clos, MD   40 mg at 01/03/13 2146  . clopidogrel (PLAVIX) tablet 75 mg  75 mg Oral QAC breakfast Eduard Clos, MD   75 mg at 01/03/13 1200  . FLUoxetine (PROZAC) capsule 20 mg  20 mg Oral Daily Eduard Clos, MD   20 mg at 01/03/13 1200  . furosemide (LASIX) tablet 40 mg  40 mg Oral Daily Eduard Clos,  MD   40 mg at 01/03/13 1700  . glimepiride (AMARYL) tablet 2 mg  2 mg Oral Q breakfast Eduard Clos, MD   2 mg at 01/03/13 1200  . heparin ADULT infusion 100 units/mL (25000 units/250 mL)  1,000 Units/hr Intravenous Continuous Marykay Lex, MD 10 mL/hr at 01/04/13 0052 1,000 Units/hr at 01/04/13 0052  . insulin aspart (novoLOG) injection 0-9 Units  0-9 Units Subcutaneous TID WC Eduard Clos, MD      . isosorbide mononitrate (IMDUR) 24 hr tablet 90 mg  90 mg Oral Daily Eduard Clos, MD   90 mg at 01/03/13 1200  . metoprolol succinate (TOPROL-XL) 24 hr tablet 50 mg  50 mg Oral Daily Eduard Clos, MD   50 mg at 01/03/13 1200  . nitroGLYCERIN (NITROSTAT) SL tablet 0.4 mg  0.4 mg Sublingual Q5 min PRN Eduard Clos, MD      . ondansetron [facility]) tablet 4 mg  4 mg Oral Q6H PRN Eduard Clos, MD       Or  . ondansetron [facility]) injection 4 mg  4 mg Intravenous Q6H PRN Eduard Clos, MD      . oxybutynin (DITROPAN-XL) 24 hr tablet 10 mg  10 mg Oral Daily Eduard Clos, MD   10 mg at 01/03/13 1700  . pantoprazole (PROTONIX) EC tablet 40 mg  40 mg Oral Daily Eduard Clos, MD   40 mg at 01/03/13 1200  . potassium chloride SA (K-DUR,KLOR-CON) CR tablet 20 mEq  20 mEq Oral Daily Eduard Clos, MD   20 mEq at 01/03/13 1506  . sodium chloride 0.9 % injection 3 mL  3 mL Intravenous Q12H Eduard Clos, MD   3 mL at 01/03/13 2200    PE: General appearance: alert, cooperative and no distress Lungs: clear to auscultation bilaterally Heart: regular rate and rhythm Extremities: no LEE Pulses: 2+ and symmetric Skin: warm and dry Neurologic: Grossly normal  Lab Results:   Recent Labs  01/02/13 2027 01/03/13 1110 01/04/13 0508  WBC 9.7 10.9* 8.5  HGB 8.6* 8.7* 7.5*  HCT 25.3* 26.5* 22.9*  PLT 281 292 271   BMET  Recent Labs  01/03/13 1110 01/03/13 1557 01/04/13 0508  NA 145 141 144  K 3.5 3.7 3.8  CL 108 106 109  CO2 25  23 25   GLUCOSE 129* 244* 107*  BUN 20 19 22   CREATININE 1.55* 1.57* 1.72*  CALCIUM 9.3 9.2 8.8   PT/INR  Recent Labs  01/04/13 0508  LABPROT 14.4  INR 1.14   Cardiac Enzymes Cardiac Panel (last 3 results)  Recent Labs  01/03/13 1110 01/03/13 1558 01/03/13 1956  TROPONINI 0.89* 0.44* 0.74*    Assessment/Plan  Principal Problem:   Non-ST elevation MI (NSTEMI) Active Problems:   OBSTRUCTIVE SLEEP APNEA   COPD   Anemia   Type II or unspecified type diabetes mellitus without mention of complication, uncontrolled   SVT (supraventricular tachycardia)   ARF (acute renal failure)   HTN (hypertension)   HLD (hyperlipidemia)   PVD (peripheral vascular disease)  Plan: Troponin spiked back up overnight. Troponin trend in past 24 hrs: 0.89, 0.44, 0.74. The patient has remained chest pain free. The decision was made not to discharge heparin despite falling hemoglobin. H/H today is 7.5/22.9. ? If pt needs transfusion. SCr also is elevated at 1.72. She is still hypertensive with BP of 167/89. Has not yet received morning meds. Amlodipine was increased yesterday to 10 mg daily. NSR on telemetry. Due to drop in H/H and elevated SCr. may need to delay cath until tomorrow. MD to see to determine.    LOS: 2 days    Brittainy M. Sharol Harness, PA-C 01/04/2013 8:02 AM  I have seen and examined the patient along with Brittainy M. Sharol Harness, PA-C.  I have reviewed the chart, notes and new data.  I agree with PA's note.  Key new complaints: no angina, lying fully supine without dyspnea; mild RUQ discomfort; ravenous appetite; 30 lb weight loss since January; losing hair. Key examination changes: pale, BP still mildly high, no clinical signs of CHF, no new arrhythmia, poor right pedal pulses, normal left pedal pulses; no abnormalities on abdominal exam, specifically -ve Murphy's sign. Key new findings / data: cTnI wavering around 0.7, not a true rise and fall patter; creatinine has worsened again and  Hgb has dropped markedly; labs c/w Fe deficiency. Normal TSH.  PLAN: Defer coronary angio for today. Recheck H/H and renal function parameters. If no further drop in Hgb and creat better in AM (baseline is 1.1), will perform cath in AM. Hemoccult. Transfuse if Hgb<7 or if she develops angina or CHF. When she does have coronary angio, I would recommend against same  day PCI/stent (unless a truly dire coronary abnormality). Would prefer to clarify cause of Fe def anemia before committing to long term antiplatelet therapy. Even if we can't "fix" her source of bleeding, knowing its cause might clarify whether it is safe to place a drug-eluting stent , a bare metal stent or none at all. Colon ca is a concern.  Thurmon Fair, MD, [facility] [phone] 01/04/2013, 8:33 AM

## 2013-01-05 ENCOUNTER — Encounter (HOSPITAL_COMMUNITY): Payer: Self-pay | Admitting: Physician Assistant

## 2013-01-05 DIAGNOSIS — R634 Abnormal weight loss: Secondary | ICD-10-CM | POA: Diagnosis present

## 2013-01-05 DIAGNOSIS — D649 Anemia, unspecified: Secondary | ICD-10-CM

## 2013-01-05 DIAGNOSIS — R630 Anorexia: Secondary | ICD-10-CM | POA: Diagnosis present

## 2013-01-05 LAB — URINALYSIS, ROUTINE W REFLEX MICROSCOPIC
Glucose, UA: NEGATIVE mg/dL
Hgb urine dipstick: NEGATIVE
Protein, ur: 30 mg/dL — AB

## 2013-01-05 LAB — TRANSFERRIN: Transferrin: 277 mg/dL (ref 200–360)

## 2013-01-05 LAB — BASIC METABOLIC PANEL
BUN: 23 mg/dL (ref 6–23)
Creatinine, Ser: 1.95 mg/dL — ABNORMAL HIGH (ref 0.50–1.10)
GFR calc Af Amer: 27 mL/min — ABNORMAL LOW (ref >90)
GFR calc non Af Amer: 23 mL/min — ABNORMAL LOW (ref >90)

## 2013-01-05 LAB — GLUCOSE, CAPILLARY
Glucose-Capillary: 141 mg/dL — ABNORMAL HIGH (ref 70–99)
Glucose-Capillary: 123 mg/dL — ABNORMAL HIGH (ref 70–99)
Glucose-Capillary: 161 mg/dL — ABNORMAL HIGH (ref 70–99)

## 2013-01-05 LAB — CBC
HCT: 25.3 % — ABNORMAL LOW (ref 36.0–46.0)
MCHC: 32.8 g/dL (ref 30.0–36.0)
MCV: 89.4 fL (ref 78.0–100.0)
Platelets: 282 10*3/uL (ref 150–400)
RDW: 13.8 % (ref 11.5–15.5)

## 2013-01-05 LAB — URINE MICROSCOPIC-ADD ON

## 2013-01-05 LAB — CREATININE, URINE, RANDOM: Creatinine, Urine: 40.62 mg/dL

## 2013-01-05 LAB — SODIUM, URINE, RANDOM: Sodium, Ur: 97 mEq/L

## 2013-01-05 LAB — HEPARIN LEVEL (UNFRACTIONATED): Heparin Unfractionated: <0.1 IU/mL — ABNORMAL LOW (ref 0.30–0.70)

## 2013-01-05 LAB — FERRITIN: Ferritin: 12 ng/mL (ref 10–291)

## 2013-01-05 LAB — LACTATE DEHYDROGENASE: LDH: 202 U/L (ref 94–250)

## 2013-01-05 MED ORDER — METOPROLOL SUCCINATE ER 50 MG PO TB24
75.0000 mg | ORAL_TABLET | Freq: Every day | ORAL | Status: DC
  Administered 2013-01-05 – 2013-01-07 (×3): 75 mg via ORAL
  Filled 2013-01-05 (×4): qty 1

## 2013-01-05 NOTE — Progress Notes (Signed)
  Echocardiogram 2D Echocardiogram has been performed.  Megan Mathews 01/05/2013, 12:21 PM

## 2013-01-05 NOTE — Progress Notes (Signed)
[facility]  Subjective: No further chest discomfort. No SOB. Denies lightheaded.   Objective: Vital signs in last 24 hours: Temp:  [97.4 F (36.3 C)-98.7 F (37.1 C)] 98.5 F (36.9 C) (05/02 0827) Pulse Rate:  [75-96] 75 (05/01 1600) Resp:  [17-19] 19 (05/01 2000) BP: (127-179)/(42-94) 179/60 mmHg (05/02 0827) SpO2:  [95 %-100 %] 95 % (05/02 0425) Last BM Date: 01/02/13  Intake/Output from previous day: 05/01 0701 - 05/02 0700 In: 2370 [P.O.:720; I.V.:1650] Out: 1900 [Urine:1900] Intake/Output this shift: Total I/O In: 390 [P.O.:240; I.V.:150] Out: 350 [Urine:350]  Medications Current Facility-Administered Medications  Medication Dose Route Frequency Provider Last Rate Last Dose  . 0.9 %  sodium chloride infusion   Intravenous Continuous Eduard Clos, MD      . 0.9 %  sodium chloride infusion   Intravenous Continuous Toddy Boyd, PA-C 75 mL/hr at 01/04/13 0052    . acetaminophen (TYLENOL) tablet 650 mg  650 mg Oral Q6H PRN Eduard Clos, MD       Or  . acetaminophen (TYLENOL) suppository 650 mg  650 mg Rectal Q6H PRN Eduard Clos, MD      . albuterol (PROVENTIL) (5 MG/ML) 0.5% nebulizer solution 2.5 mg  2.5 mg Nebulization Q4H PRN Eduard Clos, MD      . ALPRAZolam Prudy Feeler) tablet 0.25 mg  0.25 mg Oral BID PRN Eduard Clos, MD      . amLODipine (NORVASC) tablet 10 mg  10 mg Oral Daily Marykay Lex, MD   10 mg at 01/04/13 0905  . aspirin EC tablet 325 mg  325 mg Oral Daily Eduard Clos, MD   325 mg at 01/04/13 0904  . atorvastatin (LIPITOR) tablet 40 mg  40 mg Oral QHS Eduard Clos, MD   40 mg at 01/04/13 2339  . clopidogrel (PLAVIX) tablet 75 mg  75 mg Oral QAC breakfast Eduard Clos, MD   75 mg at 01/05/13 0826  . ferrous sulfate tablet 325 mg  325 mg Oral TID WC Mihai Croitoru, MD   325 mg at 01/05/13 0827  . FLUoxetine (PROZAC) capsule 20 mg  20 mg Oral Daily Eduard Clos, MD   20 mg at 01/04/13 0904  . furosemide (LASIX) tablet 40 mg  40 mg Oral Daily Eduard Clos, MD   40 mg at 01/04/13 0904  . glimepiride (AMARYL) tablet 2 mg  2 mg Oral Q breakfast Eduard Clos, MD   2 mg at 01/05/13 0827  . heparin injection 5,000 Units  5,000 Units Subcutaneous Q8H Mihai Croitoru, MD   5,000 Units at 01/05/13 1610  . insulin aspart (novoLOG) injection 0-9 Units  0-9 Units Subcutaneous TID WC Eduard Clos, MD   2 Units at 01/04/13 1351  . isosorbide mononitrate (IMDUR) 24 hr tablet 90 mg  90 mg Oral Daily Eduard Clos, MD   90 mg at 01/04/13 0905  . metoprolol succinate (TOPROL-XL) 24 hr tablet 75 mg  75 mg Oral Daily Chrystie Nose, MD      . nitroGLYCERIN (NITROSTAT) SL tablet 0.4 mg  0.4 mg Sublingual Q5 min PRN Eduard Clos, MD      . ondansetron [facility]) tablet 4 mg  4 mg Oral Q6H PRN Eduard Clos, MD       Or  . ondansetron (ZOFRAN) injection 4 mg  4 mg Intravenous Q6H PRN Eduard Clos, MD      .  oxybutynin (DITROPAN-XL) 24 hr tablet 10 mg  10 mg Oral Daily Eduard Clos, MD   10 mg at 01/04/13 0905  . pantoprazole (PROTONIX) EC tablet 40 mg  40 mg Oral Daily Eduard Clos, MD   40 mg at 01/04/13 1235  . potassium chloride SA (K-DUR,KLOR-CON) CR tablet 20 mEq  20 mEq Oral Daily Eduard Clos, MD   20 mEq at 01/04/13 0904  . sodium chloride 0.9 % injection 3 mL  3 mL Intravenous Q12H Eduard Clos, MD   3 mL at 01/04/13 2200    PE: General appearance: alert, cooperative and no distress Lungs: diffuse expiratory wheezes bilaterally  Heart: regular rate and rhythm Extremities: no LEE Pulses: 2+ and symmetric Skin: warm and dry Neurologic: Grossly normal  Lab Results:   Recent Labs  01/03/13 1110 01/04/13 0508 01/05/13 0425  WBC 10.9* 8.5 9.2  HGB 8.7* 7.5* 8.3*  HCT 26.5* 22.9* 25.3*  PLT 292 271 282   BMET  Recent Labs  01/03/13 1557 01/04/13 0508 01/05/13 0425   NA 141 144 144  K 3.7 3.8 4.1  CL 106 109 108  CO2 23 25 27   GLUCOSE 244* 107* 139*  BUN 19 22 23   CREATININE 1.57* 1.72* 1.95*  CALCIUM 9.2 8.8 8.9   PT/INR  Recent Labs  01/04/13 0508  LABPROT 14.4  INR 1.14   Cardiac Panel (last 3 results)  Recent Labs  01/03/13 1110 01/03/13 1558 01/03/13 1956  TROPONINI 0.89* 0.44* 0.74*     Assessment/Plan  Principal Problem:   Non-ST elevation MI (NSTEMI) Active Problems:   OBSTRUCTIVE SLEEP APNEA   COPD   Anemia   Type II or unspecified type diabetes mellitus without mention of complication, uncontrolled   SVT (supraventricular tachycardia)   ARF (acute renal failure)   HTN (hypertension)   HLD (hyperlipidemia)   PVD (peripheral vascular disease)   Plan: Renal function continues to worsen. SCr is 1.95 (baseline of 1.1). Megan Mathews is not currently on an ACE-I/ARB. We may need to temporarily discontinue PO Lasix  and continue gentle hydration w/ IVF. Will likely need to differ cath again due to renal function. Megan Mathews denies further CP. Megan Mathews continues to be off heparin, secondary to anemia. H/H is trending back up at  8.3/25.3 (7.5/22.9 yesterday). Megan Mathews is asymptomatic. Pt has also had recent unintentional weight loss. FOBT on 01/03/13 was negative. Will place GI consult. Will also consult renal.   LOS: 3 days     Megan Mathews 01/05/2013 9:16 AM

## 2013-01-05 NOTE — Consult Note (Signed)
Reason for Consult:AKI/CKD with the need for cardiac cath in setting of NSTEMI Referring Physician: Herbie Baltimore, MD  Megan Mathews is an 79 y.o. female.  HPI: Pt is a 79yo WF with multiple medical problems, most notable for CAD, PVOD (including bilateral renal artery stenosis), DM, HTN, COPD, and pulmonary HTN who was admitted on 01/02/13 with SSCP.  She had onset at about 6 PM of severe anterior chest pain radiating to both arms. There was a heavy feeling and tight feeling typical of her angina. This was associated with dyspnea but no nausea or diaphoresis. Onset was at rest. She also reported palpitations and took aspirin and nitroglycerin with no relief. She rated pain at 8/10. She called EMS who noted that she was in a supraventricular tachycardia with heart rate of about 200. She was given adenosine with conversion to sinus rhythm. EMS reported pain had reduced to 3/10. Patient states that she is now completely pain-free and she states she was pain-free as soon as her heart rate dropped.  Pt was admitted with NSTEMI and has had resolution of her CP however her Scr has continued to rise.  We were consulted to further eval/manage her AKI/CKD.  Of note, pt has not had any recent IV contrasted studies, no ACE/ARB's/NSAIDs/COX-II I's.  The trend in Scr is seen below.  Trend in Creatinine: Creatinine, Ser  Date/Time Value Range Status  01/05/2013  4:25 AM 1.95* 0.50 - 1.10 mg/dL Final  09/10/7827  5:62 AM 1.72* 0.50 - 1.10 mg/dL Final  10/06/8655  8:46 PM 1.57* 0.50 - 1.10 mg/dL Final  9/62/9528 41:32 AM 1.55* 0.50 - 1.10 mg/dL Final  4/40/1027  2:53 PM 1.73* 0.50 - 1.10 mg/dL Final  66/44/0347  4:25 PM 1.14* 0.50 - 1.10 mg/dL Final  9/56/3875 64:33 AM 1.1  0.4 - 1.2 mg/dL Final  2/95/1884 16:60 AM 0.9  0.4 - 1.2 mg/dL Final  63/09/6008  9:32 AM 1.1  0.4-1.2 mg/dL Final  35/57/3220  2:54 AM 1.07  0.4 - 1.2 mg/dL Final  27/02/2375  2:83 AM 0.99  0.4 - 1.2 mg/dL Final  15/17/6160  7:37 AM 1.07  0.4 - 1.2  mg/dL Final  10/62/6948  5:46 PM 1.21* 0.4 - 1.2 mg/dL Final  10/13/348  0:93 AM 0.78  0.4 - 1.2 mg/dL Final  04/23/2992  7:16 AM 0.65  0.4 - 1.2 mg/dL Final  9/67/8938  1:01 AM 0.67  0.4 - 1.2 mg/dL Final  7/51/0258  5:27 AM 0.80  0.4 - 1.2 mg/dL Final  7/82/4235  3:61 AM 1.06  0.4 - 1.2 mg/dL Final  4/43/1540  0:86 PM 0.7  0.4-1.2 mg/dL Final  7/61/9509  3:26 AM 0.8  0.4-1.2 mg/dL Final  71/24/5809  9:83 AM 0.74   Final  07/28/2007  4:05 AM 0.85   Final  07/27/2007  4:00 AM 0.77   Final  07/26/2007  8:50 PM 0.76   Final  02/28/2007 10:13 AM 0.8  0.4-1.2 mg/dL Final    PMH:   Past Medical History  Diagnosis Date  . COPD (chronic obstructive pulmonary disease)     home 02  . OSA (obstructive sleep apnea)   . Hypoxemia   . Secondary pulmonary hypertension 8/09    "severe" by echo  . CAD (coronary artery disease) '95,'96,'98.'99,'08    Multiple PCI/stenting all 3 vessels  . Diabetes mellitus type II   . Hyperlipidemia   . Hypertension   . Peripheral vascular disease '96, July 2010    AoIliac stenting with  PTA for ISR 7/10  . Tobacco abuse     quit '99  . Constipation   . Anxiety   . Depression   . Artery stenosis     renal   bilateral    PSH:   Past Surgical History  Procedure Laterality Date  . Carotid endarterectomy      bilat with redo on Lt  . Total abdominal hysterectomy    . Aaa stent  10/96  . Bilateral iliac stenting  10/96, 7/10  . Resection of vocal chord lesions    . Excision serous cyst adenofibroma    . Coronary angioplasty  2/95    RCA  . Coronary angioplasty with stent placement  8/96    RCA  . Coronary angioplasty with stent placement  5/98    RCA X 2  . Coronary angioplasty with stent placement  11/99    CFX,LAD,Dx  . Coronary angiogram  12/08    patent stents, 40% LM    Allergies:  Allergies  Allergen Reactions  . Amitriptyline Other (See Comments)    Took along with Prozac-caused very bad shaking in hands, could not coordinate  movements.    . Contrast Media (Iodinated Diagnostic Agents)   . Iohexol Rash and Other (See Comments)    Fever, unable to stand also    Medications:   Prior to Admission medications   Medication Sig Start Date End Date Taking? Authorizing Provider  acetaminophen (TYLENOL) 500 MG tablet Take 1,000 mg by mouth every 6 (six) hours as needed for pain.   Yes Historical Provider, MD  albuterol (PROAIR HFA) 108 (90 BASE) MCG/ACT inhaler Inhale 2 puffs into the lungs every 4 (four) hours as needed (Cough, wheeze, or chest congestion). 2 puffs up to 4 times a day as needed 11/30/11  Yes Coralyn Helling, MD  albuterol (PROVENTIL) (2.5 MG/3ML) 0.083% nebulizer solution Take 2.5 mg by nebulization every 6 (six) hours as needed. For shortness of breath   Yes Historical Provider, MD  ALPRAZolam (XANAX) 0.25 MG tablet Take 0.25 mg by mouth 2 (two) times daily as needed for anxiety. 08/29/12 08/29/13 Yes Jacques Navy, MD  amLODipine (NORVASC) 5 MG tablet Take 1 tablet (5 mg total) by mouth daily. 12/29/12  Yes Jacques Navy, MD  aspirin 325 MG tablet Take 325 mg by mouth daily.    Yes Historical Provider, MD  Calcium Carbonate-Vitamin D (CALCIUM 600+D) 600-400 MG-UNIT per tablet Take 1 tablet by mouth daily.     Yes Historical Provider, MD  clopidogrel (PLAVIX) 75 MG tablet Take 1 tablet (75 mg total) by mouth daily. 12/29/12  Yes Jacques Navy, MD  FLUoxetine (PROZAC) 20 MG capsule Take 1 capsule (20 mg total) by mouth daily. 11/08/12  Yes Jacques Navy, MD  furosemide (LASIX) 40 MG tablet Take 1 tablet (40 mg total) by mouth daily. 07/10/12  Yes Jacques Navy, MD  glimepiride (AMARYL) 2 MG tablet Take 1 tablet (2 mg total) by mouth daily. 10/30/12  Yes Jacques Navy, MD  guaifenesin (MUCUS RELIEF) 400 MG TABS Take 400 mg by mouth 2 (two) times daily as needed. For congestion   Yes Historical Provider, MD  isosorbide mononitrate (IMDUR) 60 MG 24 hr tablet Take 90 mg by mouth daily.    Yes Historical  Provider, MD  metFORMIN (GLUCOPHAGE) 1000 MG tablet Take 1 tablet (1,000 mg total) by mouth 2 (two) times daily with a meal. 06/28/12  Yes Jacques Navy, MD  metoprolol succinate (  TOPROL-XL) 50 MG 24 hr tablet Take 1 tablet (50 mg total) by mouth daily. Take with or immediately following a meal. 01/01/13  Yes Jacques Navy, MD  Multiple Vitamins-Minerals (MULTIVITAL PO) Take 1 tablet by mouth daily.    Yes Historical Provider, MD  nitroGLYCERIN (NITROLINGUAL) 0.4 MG/SPRAY spray Place 1 spray under the tongue every 5 (five) minutes as needed for chest pain.   Yes Historical Provider, MD  oxybutynin (DITROPAN-XL) 10 MG 24 hr tablet Take 1 tablet (10 mg total) by mouth daily. 10/02/12  Yes Jacques Navy, MD  potassium chloride (MICRO-K) 10 MEQ CR capsule Take 20 mEq by mouth daily.   Yes Historical Provider, MD  simvastatin (ZOCOR) 80 MG tablet Take 1 tablet (80 mg total) by mouth at bedtime. 05/26/12  Yes Jacques Navy, MD  omeprazole (PRILOSEC) 20 MG capsule Take 1 capsule (20 mg total) by mouth 2 (two) times daily. 10/31/12 10/31/13  Jacques Navy, MD  oxymetazoline (AFRIN NASAL SPRAY) 0.05 % nasal spray 1 spray into right nostril if bleeding won't stop.  May repeat once. 04/24/12   Lollie Sails, MD    Inpatient medications: . amLODipine  10 mg Oral Daily  . aspirin EC  325 mg Oral Daily  . atorvastatin  40 mg Oral QHS  . clopidogrel  75 mg Oral QAC breakfast  . ferrous sulfate  325 mg Oral TID WC  . FLUoxetine  20 mg Oral Daily  . furosemide  40 mg Oral Daily  . glimepiride  2 mg Oral Q breakfast  . heparin  5,000 Units Subcutaneous Q8H  . insulin aspart  0-9 Units Subcutaneous TID WC  . isosorbide mononitrate  90 mg Oral Daily  . metoprolol succinate  75 mg Oral Daily  . oxybutynin  10 mg Oral Daily  . pantoprazole  40 mg Oral Daily  . potassium chloride  20 mEq Oral Daily  . sodium chloride  3 mL Intravenous Q12H    Discontinued Meds:   Medications Discontinued During  This Encounter  Medication Reason  . amitriptyline (ELAVIL) 25 MG tablet Patient Preference  . cephALEXin (KEFLEX) 500 MG capsule Patient has not taken in last 30 days  . nitroGLYCERIN (NITROSTAT) 0.4 MG SL tablet Inpatient Standard  . pentoxifylline (TRENTAL) 400 MG CR tablet Patient has not taken in last 30 days  . potassium chloride (MICRO-K) 10 MEQ CR capsule Inpatient Standard  . senna (SENOKOT) 8.6 MG tablet Patient has not taken in last 30 days  . ALPRAZolam (XANAX) 0.25 MG tablet   . heparin ADULT infusion 100 units/mL (25000 units/250 mL)   . heparin bolus via infusion 60 Units/kg   . heparin ADULT infusion 100 units/mL (25000 units/250 mL)   . heparin bolus via infusion 60 Units/kg   . acetaminophen (TYLENOL) tablet 1,000 mg Duplicate  . ALPRAZolam (XANAX) tablet 0.25 mg Duplicate  . nitroGLYCERIN (NITROLINGUAL) 0.4 MG/SPRAY spray 1 spray Formulary change  . aspirin tablet 325 mg Duplicate  . potassium chloride (MICRO-K) CR capsule 20 mEq Formulary change  . amLODipine (NORVASC) tablet 5 mg   . HYDROmorphone (DILAUDID) injection 1 mg   . heparin ADULT infusion 100 units/mL (25000 units/250 mL)   . heparin ADULT infusion 100 units/mL (25000 units/250 mL)   . metoprolol succinate (TOPROL-XL) 24 hr tablet 50 mg     Social History:  reports that she quit smoking about 15 years ago. Her smoking use included Cigarettes. She has a 60 pack-year smoking history. She  has quit using smokeless tobacco. She reports that she does not drink alcohol or use illicit drugs.  Family History:  History reviewed. No pertinent family history.  Pertinent items are noted in HPI. Weight change:   Intake/Output Summary (Last 24 hours) at 01/05/13 1235 Last data filed at 01/05/13 1100  Gross per 24 hour  Intake   1905 ml  Output   1850 ml  Net     55 ml    General appearance: alert, cooperative, no distress, moderately obese and pale Head: Normocephalic, without obvious abnormality,  atraumatic Neck: no adenopathy, no JVD, supple, symmetrical, trachea midline, thyroid not enlarged, symmetric, no tenderness/mass/nodules and bilateral carotid bruits L>R Resp: occ rhonchi Cardio: no rub GI: soft, non-tender; bowel sounds normal; no masses,  no organomegaly and no bruits Extremities: extremities normal, atraumatic, no cyanosis or edema  Labs: Basic Metabolic Panel:  Recent Labs Lab 01/02/13 2027 01/03/13 1110 01/03/13 1557 01/04/13 0508 01/05/13 0425  NA 142 145 141 144 144  K 4.1 3.5 3.7 3.8 4.1  CL 105 108 106 109 108  CO2 24 25 23 25 27   GLUCOSE 212* 129* 244* 107* 139*  BUN 27* 20 19 22 23   CREATININE 1.73* 1.55* 1.57* 1.72* 1.95*  CALCIUM 9.3 9.3 9.2 8.8 8.9   Liver Function Tests: No results found for this basename: AST, ALT, ALKPHOS, BILITOT, PROT, ALBUMIN,  in the last 168 hours No results found for this basename: LIPASE, AMYLASE,  in the last 168 hours No results found for this basename: AMMONIA,  in the last 168 hours CBC:  Recent Labs Lab 01/02/13 2027 01/03/13 1110 01/04/13 0508 01/05/13 0425  WBC 9.7 10.9* 8.5 9.2  NEUTROABS 7.6  --   --   --   HGB 8.6* 8.7* 7.5* 8.3*  HCT 25.3* 26.5* 22.9* 25.3*  MCV 87.8 88.9 89.8 89.4  PLT 281 292 271 282   PT/INR: @labrcntip (inr:5) Cardiac Enzymes:  Recent Labs Lab 01/02/13 2027 01/02/13 2357 01/03/13 1110 01/03/13 1558 01/03/13 1956  TROPONINI <0.30 0.83* 0.89* 0.44* 0.74*   CBG:  Recent Labs Lab 01/04/13 1341 01/04/13 1609 01/04/13 1955 01/05/13 0825 01/05/13 1204  GLUCAP 160* 127* 183* 110* 141*    Iron Studies: No results found for this basename: IRON, TIBC, TRANSFERRIN, FERRITIN,  in the last 168 hours  Xrays/Other Studies: No results found.   Assessment/Plan: 1.  AKI/CKD- non-oliguric.  pt with extensive atherosclerotic vascular disease and h/o bilateral RAS (nonobstructive by duplex in 12/13) in setting of SVT and HR in 200's.  ?transient relative hypotension causing  ischemic atn.  Also on DDx would be occlusion of Renal arteries, although no embolic changes of toes for atheroemboli.  ?plaque rupture.  Will check studies and consider reimaging renal arteries with non contrasted MRI vs duplex.  Will check urine eos and complements for ?spontaneous atheroembolic episode.  Hold off on cardiac cath for now until her Scr improves. 2. NSTEMI- induced by demand ischemia from SVT.  Hold off on cath due to rising Scr 3. Normocytic anemia- may be due to CKD.  Agree with further GI workup. Will also check SPEP/UPEP, stool cards, iron.  May benefit from EPO.  May need transfusion if she develops more CP or angina 4. HTN- per cardiology 5. DM- per primary svc. 6. Bilateral RAS- as above 7. PVD- per cardiology 8. COPD- stable   Delainy Mcelhiney A 01/05/2013, 12:35 PM

## 2013-01-05 NOTE — Consult Note (Signed)
[facility] Consult: 1:11 PM 01/05/2013   Referring Provider:  Dr Royann Shivers Primary Care Physician:  Illene Regulus, MD Primary Gastroenterologist:  Dr. Lina Sar   Reason for Consultation:  Normocytic, FOB negative anemia  HPI: Megan Mathews is a 79 y.o. female.  Has NIDDM, obesity, OSA, severe pulmonary htn, oxygen dependent COPD, on Plavix and 325 mg ASA post multiple coronary PCIs and stentings as well as aorto illiac stenting. S/p CEA.    Admitted 4/30 with chest pain, palpitations and ruled in for non STEMI. Cardiac cath on hold due to renal disease.   Hgb 8.6 on arrival. Compares to ~ 10 in 07/2012 Hgb drifted to 7.5 yesterday, up to 8.3 today. Has not been transfused since admission.  Cardiologist plans transfusion if Hgb dips to 7.0 or less.  Iron studies, ferritin are pending.  TSH normal.   Stool FOB negative and no observation of BPR or melena per pt.   Previous colonoscopies in 1990s and in 2008 revealed only diverticulosis.  Mild gastritis on 04/2011 EGD at which point she had iron def anemia but was also FOB negative. She was transfused 2 units PRBCs in 04/2011.  She has not been biopsied or lab tested for celiac disease.  She takes no oral iron supplements. I do not see oral Iron on old med lists. TID Iron has now been started by cardiologist.   Out pt med list states she takes 40 mg (two 20 mg tabs) Prilosec twice daily.  However it is not clear this is true as only BID med is just one white pill, not two per pt. Interestingly her appetite and food aversion are gone since admission and starting on daily Protonix.    She has no abdominal pain.  Since January 2014 appetite diminished and she gets queasy sometimes just thinking about food.  Does not vomit.  Occasional solid and liquid dysphagia with regurgitation. Weight is down at least 30 # since Dec 2013. No black or bloody stools, no abdominal pain.  No NSAIDs .  No nose  bleeds since an occurrence >one year ago. BMs stable at every one to three days, does not use laxatives.    Past Medical History  Diagnosis Date  . COPD (chronic obstructive pulmonary disease)     home 02  . OSA (obstructive sleep apnea)   . Hypoxemia   . Secondary pulmonary hypertension 8/09    "severe" by echo  . CAD (coronary artery disease) '95,'96,'98.'99,'08    Multiple PCI/stenting all 3 vessels  . Diabetes mellitus type II   . Hyperlipidemia   . Hypertension   . Peripheral vascular disease '96, July 2010    AoIliac stenting with PTA for ISR 7/10  . Tobacco abuse     quit '99  . Constipation   . Anxiety   . Depression   . Artery stenosis     renal   bilateral  . Gastritis 04/2011    mild gastritis on EGD    Past Surgical History  Procedure Laterality Date  . Carotid endarterectomy      bilat with redo on Lt  . Total abdominal hysterectomy    . Aaa stent  10/96  . Bilateral iliac stenting  10/96, 7/10  . Resection of vocal chord lesions    . Excision serous cyst adenofibroma    . Coronary angioplasty  2/95    RCA  . Coronary angioplasty with stent placement  8/96    RCA  . Coronary angioplasty with  stent placement  5/98    RCA X 2  . Coronary angioplasty with stent placement  11/99    CFX,LAD,Dx  . Coronary angiogram  12/08    patent stents, 40% LM  . Thoracotomy Left 1960s    removal of "glandular tumor" (pt's words)    Prior to Admission medications   Medication Sig Start Date End Date Taking? Authorizing Provider  acetaminophen (TYLENOL) 500 MG tablet Take 1,000 mg by mouth every 6 (six) hours as needed for pain.   Yes Historical Provider, MD  albuterol (PROAIR HFA) 108 (90 BASE) MCG/ACT inhaler Inhale 2 puffs into the lungs every 4 (four) hours as needed (Cough, wheeze, or chest congestion). 2 puffs up to 4 times a day as needed 11/30/11  Yes Coralyn Helling, MD  albuterol (PROVENTIL) (2.5 MG/3ML) 0.083% nebulizer solution Take 2.5 mg by nebulization every  6 (six) hours as needed. For shortness of breath   Yes Historical Provider, MD  ALPRAZolam (XANAX) 0.25 MG tablet Take 0.25 mg by mouth 2 (two) times daily as needed for anxiety. 08/29/12 08/29/13 Yes Jacques Navy, MD  amLODipine (NORVASC) 5 MG tablet Take 1 tablet (5 mg total) by mouth daily. 12/29/12  Yes Jacques Navy, MD  aspirin 325 MG tablet Take 325 mg by mouth daily.    Yes Historical Provider, MD  Calcium Carbonate-Vitamin D (CALCIUM 600+D) 600-400 MG-UNIT per tablet Take 1 tablet by mouth daily.     Yes Historical Provider, MD  clopidogrel (PLAVIX) 75 MG tablet Take 1 tablet (75 mg total) by mouth daily. 12/29/12  Yes Jacques Navy, MD  FLUoxetine (PROZAC) 20 MG capsule Take 1 capsule (20 mg total) by mouth daily. 11/08/12  Yes Jacques Navy, MD  furosemide (LASIX) 40 MG tablet Take 1 tablet (40 mg total) by mouth daily. 07/10/12  Yes Jacques Navy, MD  glimepiride (AMARYL) 2 MG tablet Take 1 tablet (2 mg total) by mouth daily. 10/30/12  Yes Jacques Navy, MD  guaifenesin (MUCUS RELIEF) 400 MG TABS Take 400 mg by mouth 2 (two) times daily as needed. For congestion   Yes Historical Provider, MD  isosorbide mononitrate (IMDUR) 60 MG 24 hr tablet Take 90 mg by mouth daily.    Yes Historical Provider, MD  metFORMIN (GLUCOPHAGE) 1000 MG tablet Take 1 tablet (1,000 mg total) by mouth 2 (two) times daily with a meal. 06/28/12  Yes Jacques Navy, MD  metoprolol succinate (TOPROL-XL) 50 MG 24 hr tablet Take 1 tablet (50 mg total) by mouth daily. Take with or immediately following a meal. 01/01/13  Yes Jacques Navy, MD  Multiple Vitamins-Minerals (MULTIVITAL PO) Take 1 tablet by mouth daily.    Yes Historical Provider, MD  nitroGLYCERIN (NITROLINGUAL) 0.4 MG/SPRAY spray Place 1 spray under the tongue every 5 (five) minutes as needed for chest pain.   Yes Historical Provider, MD  oxybutynin (DITROPAN-XL) 10 MG 24 hr tablet Take 1 tablet (10 mg total) by mouth daily. 10/02/12  Yes  Jacques Navy, MD  potassium chloride (MICRO-K) 10 MEQ CR capsule Take 20 mEq by mouth daily.   Yes Historical Provider, MD  simvastatin (ZOCOR) 80 MG tablet Take 1 tablet (80 mg total) by mouth at bedtime. 05/26/12  Yes Jacques Navy, MD  omeprazole (PRILOSEC) 20 MG capsule Take 1 capsule (20 mg total) by mouth 2 (two) times daily. 10/31/12 10/31/13  Jacques Navy, MD  oxymetazoline (AFRIN NASAL SPRAY) 0.05 % nasal spray 1 spray  into right nostril if bleeding won't stop.  May repeat once. 04/24/12   Lollie Sails, MD    Scheduled Meds: . amLODipine  10 mg Oral Daily  . aspirin EC  325 mg Oral Daily  . atorvastatin  40 mg Oral QHS  . clopidogrel  75 mg Oral QAC breakfast  . ferrous sulfate  325 mg Oral TID WC  . FLUoxetine  20 mg Oral Daily  . furosemide  40 mg Oral Daily  . glimepiride  2 mg Oral Q breakfast  . heparin  5,000 Units Subcutaneous Q8H  . insulin aspart  0-9 Units Subcutaneous TID WC  . isosorbide mononitrate  90 mg Oral Daily  . metoprolol succinate  75 mg Oral Daily  . oxybutynin  10 mg Oral Daily  . pantoprazole  40 mg Oral Daily  . potassium chloride  20 mEq Oral Daily  . sodium chloride  3 mL Intravenous Q12H   Infusions: . sodium chloride Stopped (01/03/13 0945)  . sodium chloride 75 mL/hr at 01/04/13 0052   PRN Meds: acetaminophen, acetaminophen, albuterol, ALPRAZolam, nitroGLYCERIN, ondansetron (ZOFRAN) IV, ondansetron   Allergies as of 01/02/2013 - Review Complete 01/02/2013  Allergen Reaction Noted  . Amitriptyline Other (See Comments) 01/02/2013  . Iohexol Rash and Other (See Comments) 07/28/2007    History reviewed. No pertinent family history.  History   Social History  . Marital Status: Widowed    Spouse Name: N/A    Number of Children: N/A  . Years of Education: N/A   Occupational History  . Not on file.   Social History Main Topics  . Smoking status: Former Smoker -- 1.00 packs/day for 60 years    Types: Cigarettes    Quit  date: 10/08/1997  . Smokeless tobacco: Former Neurosurgeon  . Alcohol Use: No  . Drug Use: No  . Sexually Active: No   Other Topics Concern  . Not on file   Social History Narrative   Widowed. Daughters are very supportive - which allows her to live alone. End-of-Life care: discussed living will, HCPOA and MOST form. All are provided to the patient for her to consider with the help of her family. Out of Facilty Order and blank MOST form signed. (07/22/10)    REVIEW OF SYSTEMS: Constitutional:  Per HPI ENT:  No nose bleeds, no congestion Eyes:  Lots of clear discharge from left eye Pulm:  No cough, no  CV:  No current chest pain or palpitations.   GU:  No hematuria or nocturia GI:  Per HPI Heme:  Per HPI    Transfusions:  In 2012 Neuro:  No dizziness or syncope.  No headaches.  Some mild upper extremity trmor.  Can not afford hearing aid for [facility]. Derm:  No rash or sores Endocrine:  No excessive thirst or sweating. Immunization: flu and pneumovax up to date.  Travel:  none   PHYSICAL EXAM: Vital signs in last 24 hours: Temp:  [98.1 F (36.7 C)-98.7 F (37.1 C)] 98.5 F (36.9 C) (05/02 0827) Pulse Rate:  [75] 75 (05/01 1600) Resp:  [18-19] 19 (05/01 2000) BP: (127-179)/(46-94) 179/60 mmHg (05/02 0827) SpO2:  [94 %-100 %] 94 % (05/02 0827)  General: pale, obese, elderly WF.  She is comfortable.  She is very HOH Head:  No asymmetry or facial swelling  Eyes:  No iceterus, conjunctiva are pink Ears:  Very HOH  Nose:  No discharge or sneezing Mouth:  Clear and pink, moist oral MM>   Neck:  No masses, no bruits, no JVD Lungs:  Crackles in bases.  Dyspnea with minor effort.  Heart: RRR.  No MRG Abdomen:  Soft, obese, no mass, NT, ND. No hernia.  Non-tender, shelf like firm margin extending into lower abdomen on right and mid upper abdomen on left .   Rectal: FOB negative in lab, did not perform rectal exam.   Musc/Skeltl: no joint contractures or erythema Extremities:  No  CCE Neurologic:  HOH, slight tremor in hands.  No goss limb weakness.  Oriented x 3.  Skin:  Purpura on arms Tattoos:  none Nodes:  No inguinal adenopathy.   Psych:  Pleasant, cooperative, no anxiety/depression/agitation.   Intake/Output from previous day: 05/01 0701 - 05/02 0700 In: 2370 [P.O.:720; I.V.:1650] Out: 1900 [Urine:1900] Intake/Output this shift: Total I/O In: 390 [P.O.:240; I.V.:150] Out: 450 [Urine:450]  LAB RESULTS:  Recent Labs  01/03/13 1110 01/04/13 0508 01/05/13 0425  WBC 10.9* 8.5 9.2  HGB 8.7* 7.5* 8.3*  HCT 26.5* 22.9* 25.3*  PLT 292 271 282  MCV    89  BMET Lab Results  Component Value Date   NA 144 01/05/2013   NA 144 01/04/2013   NA 141 01/03/2013   K 4.1 01/05/2013   K 3.8 01/04/2013   K 3.7 01/03/2013   CL 108 01/05/2013   CL 109 01/04/2013   CL 106 01/03/2013   CO2 27 01/05/2013   CO2 25 01/04/2013   CO2 23 01/03/2013   GLUCOSE 139* 01/05/2013   GLUCOSE 107* 01/04/2013   GLUCOSE 244* 01/03/2013   BUN 23 01/05/2013   BUN 22 01/04/2013   BUN 19 01/03/2013   CREATININE 1.95* 01/05/2013   CREATININE 1.72* 01/04/2013   CREATININE 1.57* 01/03/2013   CALCIUM 8.9 01/05/2013   CALCIUM 8.8 01/04/2013   CALCIUM 9.2 01/03/2013    PT/INR Lab Results  Component Value Date   INR 1.14 01/04/2013   FOB   Negative   01/03/13   RADIOLOGY STUDIES: 01/03/13   CXR IMPRESSION:  Cardiac enlargement with mild pulmonary vascular congestion. No  edema or consolidation  08/02/12  CT abdomen/pelvis without IV contrast 1. No acute findings in the abdomen or pelvis to account for the  patient's symptoms.  2. Hepatic steatosis.  3. Extensive atherosclerosis, including at least two-vessel  coronary artery disease.  4. Calcifications in the right renal hilum, favored to be  vascular; tiny nonobstructive calculi are difficult to entirely  exclude.  5. Additional incidental findings, as above.   ENDOSCOPIC STUDIES: 04/2011   EGD  Dr Christella Hartigan for FOB negative, IDA anemia Minor distal  gastritis  2008 colonoscopy Diverticulosis  1997 colonoscopy  No polyps     IMPRESSION: *  Normocytic anemia Hx of same in 2012 with minor gastritis on EGD.  Colonoscopies in 1997 and 2008 without polyps, AVMs, colitis etc:  Just diverticulosis.  *  30 # weight loss in 5 to 6 months. Anorexia, mild queasiness and food aversion which has improved since admission.  Wonder if she was really taking PPI at home?  Asked her to get her med list brought to hospital for review.  Somewhat reassuring that non-contrast CT scan in 07/2012 did not show any GI issues other than hepatic steatosis. It did show extensive vascular disease and these sxs could represent mesenteric vascular insufficiency, though not having much if any post prandial abdominal pain. *  Non STEMI.  Cardiac cath on hold secondary to low GFR *  CKD.  GFR is 23. Dr Abel Presto  is seeing her now, feels some of the anemia can be explained by renal disease.  *  Type 2 DM.  Not insulin requiring at home.  *  Chronic Plavix for extensive hx stenting, CAD and PVD *  OSA, COPD.  On bipap at home.  Chronic hypoxic resp failure per Dr Evlyn Courier office notes.  *  Fatty liver on 07/2012 CT scan.  On abdominal exam has non-tender, shelf like margin extending into lower abdomen on right and mid upper abdomen on left, not sure if this is her liver or not.  PT/INR not abnormal, LFTs normal.    PLAN: *  Per Dr Leone Payor.  Continue once daily Protonix.    LOS: 3 days   Jennye Moccasin  01/05/2013, 1:11 PM Pager: [phone]   [facility] Attending  I have also seen and assessed the patient and agree with the above note.  She is a nice lady with numerous significant medical problems. She tells me she is tired at home 9lives alone) and does not feel hungry and is not motivated to cook. She is eating well here.  I think colonoscopy would be low yield. She was anemic last year also and I suspect at least has anemia of chronic disease. She is not very  interested in aggressive testing.  I am waiting on ferritin. Will follow-up and discuss possibilities with her but it may be best to treat her medically overall.  Iva Boop, MD, [facility] [phone] (pager) 01/05/2013 3:48 PM

## 2013-01-05 NOTE — Progress Notes (Signed)
[facility]  Subjective: No further chest discomfort. No SOB. Denies lightheaded.   Objective: Vital signs in last 24 hours: Temp:  [97.4 F (36.3 C)-98.7 F (37.1 C)] 98.6 F (37 C) (05/02 0425) Pulse Rate:  [75-96] 75 (05/01 1600) Resp:  [17-19] 19 (05/01 2000) BP: (127-168)/(42-94) 159/48 mmHg (05/02 0425) SpO2:  [95 %-100 %] 95 % (05/02 0425) Last BM Date: 01/02/13  Intake/Output from previous day: 05/01 0701 - 05/02 0700 In: 2295 [P.O.:720; I.V.:1575] Out: 1900 [Urine:1900] Intake/Output this shift:    Medications Current Facility-Administered Medications  Medication Dose Route Frequency Provider Last Rate Last Dose  . 0.9 %  sodium chloride infusion   Intravenous Continuous Eduard Clos, MD      . 0.9 %  sodium chloride infusion   Intravenous Continuous Brittainy Simmons, PA-C 75 mL/hr at 01/04/13 0052    . acetaminophen (TYLENOL) tablet 650 mg  650 mg Oral Q6H PRN Eduard Clos, MD       Or  . acetaminophen (TYLENOL) suppository 650 mg  650 mg Rectal Q6H PRN Eduard Clos, MD      . albuterol (PROVENTIL) (5 MG/ML) 0.5% nebulizer solution 2.5 mg  2.5 mg Nebulization Q4H PRN Eduard Clos, MD      . ALPRAZolam Prudy Feeler) tablet 0.25 mg  0.25 mg Oral BID PRN Eduard Clos, MD      . amLODipine (NORVASC) tablet 10 mg  10 mg Oral Daily Marykay Lex, MD   10 mg at 01/04/13 0905  . aspirin EC tablet 325 mg  325 mg Oral Daily Eduard Clos, MD   325 mg at 01/04/13 0904  . atorvastatin (LIPITOR) tablet 40 mg  40 mg Oral QHS Eduard Clos, MD   40 mg at 01/04/13 2339  . clopidogrel (PLAVIX) tablet 75 mg  75 mg Oral QAC breakfast Eduard Clos, MD   75 mg at 01/04/13 0904  . ferrous sulfate tablet 325 mg  325 mg Oral TID WC Mihai Croitoru, MD   325 mg at 01/04/13 1855  . FLUoxetine (PROZAC) capsule 20 mg  20 mg Oral Daily Eduard Clos, MD   20 mg at 01/04/13 0904  . furosemide (LASIX) tablet 40  mg  40 mg Oral Daily Eduard Clos, MD   40 mg at 01/04/13 0904  . glimepiride (AMARYL) tablet 2 mg  2 mg Oral Q breakfast Eduard Clos, MD   2 mg at 01/04/13 0904  . heparin injection 5,000 Units  5,000 Units Subcutaneous Q8H Mihai Croitoru, MD   5,000 Units at 01/05/13 1610  . insulin aspart (novoLOG) injection 0-9 Units  0-9 Units Subcutaneous TID WC Eduard Clos, MD   2 Units at 01/04/13 1351  . isosorbide mononitrate (IMDUR) 24 hr tablet 90 mg  90 mg Oral Daily Eduard Clos, MD   90 mg at 01/04/13 0905  . metoprolol succinate (TOPROL-XL) 24 hr tablet 50 mg  50 mg Oral Daily Eduard Clos, MD   50 mg at 01/04/13 0905  . nitroGLYCERIN (NITROSTAT) SL tablet 0.4 mg  0.4 mg Sublingual Q5 min PRN Eduard Clos, MD      . ondansetron [facility]) tablet 4 mg  4 mg Oral Q6H PRN Eduard Clos, MD       Or  . ondansetron (ZOFRAN) injection 4 mg  4 mg Intravenous Q6H PRN Eduard Clos, MD      . oxybutynin (DITROPAN-XL) 24  hr tablet 10 mg  10 mg Oral Daily Eduard Clos, MD   10 mg at 01/04/13 0905  . pantoprazole (PROTONIX) EC tablet 40 mg  40 mg Oral Daily Eduard Clos, MD   40 mg at 01/04/13 1235  . potassium chloride SA (K-DUR,KLOR-CON) CR tablet 20 mEq  20 mEq Oral Daily Eduard Clos, MD   20 mEq at 01/04/13 0904  . sodium chloride 0.9 % injection 3 mL  3 mL Intravenous Q12H Eduard Clos, MD   3 mL at 01/04/13 2200    PE: General appearance: alert, cooperative and no distress Lungs: diffuse expiratory wheezes bilaterally  Heart: regular rate and rhythm Extremities: no LEE Pulses: 2+ and symmetric Skin: warm and dry Neurologic: Grossly normal  Lab Results:   Recent Labs  01/03/13 1110 01/04/13 0508 01/05/13 0425  WBC 10.9* 8.5 9.2  HGB 8.7* 7.5* 8.3*  HCT 26.5* 22.9* 25.3*  PLT 292 271 282   BMET  Recent Labs  01/03/13 1557 01/04/13 0508 01/05/13 0425  NA 141 144 144  K 3.7 3.8 4.1  CL 106 109 108   CO2 23 25 27   GLUCOSE 244* 107* 139*  BUN 19 22 23   CREATININE 1.57* 1.72* 1.95*  CALCIUM 9.2 8.8 8.9   PT/INR  Recent Labs  01/04/13 0508  LABPROT 14.4  INR 1.14   Cardiac Panel (last 3 results)  Recent Labs  01/03/13 1110 01/03/13 1558 01/03/13 1956  TROPONINI 0.89* 0.44* 0.74*     Assessment/Plan  Principal Problem:   Non-ST elevation MI (NSTEMI) Active Problems:   OBSTRUCTIVE SLEEP APNEA   COPD   Anemia   Type II or unspecified type diabetes mellitus without mention of complication, uncontrolled   SVT (supraventricular tachycardia)   ARF (acute renal failure)   HTN (hypertension)   HLD (hyperlipidemia)   PVD (peripheral vascular disease)   Plan: Renal function continues to worsen. SCr is 1.95 (baseline of 1.1). She is not currently on an ACE-I/ARB. We may need to temporarily discontinue PO Lasix  and continue gentle hydration w/ IVF. Will likely need to differ cath again due to renal function. She denies further CP. She continues to be off heparin, secondary to anemia. H/H is trending back up at  8.3/25.3 (7.5/22.9 yesterday). She is asymptomatic. Pt has also had recent unintentional weight loss. FOBT on 01/03/13 was negative. Will place GI consult. Will also consult renal.   LOS: 3 days     Brittainy M. Sharol Harness, PA-C 01/05/2013 8:20 AM

## 2013-01-05 NOTE — Progress Notes (Addendum)
Pt. Seen and examined. Agree with the NP/PA-C note as written.  She is now chest pain free. Renal function is worsened, despite hydration. Will defer catheterization today. Will order anemia work-up (?iron-deficient, 30lb weight loss, ?CA, hemoccult negative -- or related to worsening CKD?) and consult renal. Also, consult GI for evaluation unintentional weight loss and anemia. This needs to be addressed before considering catheterization and to help plan strategy. Check 2D Echo today to evaluate LV function (last assessed in 2010).  Increase toprol XL to 75 mg daily today for persistent hypertension.  Chrystie Nose, MD, [facility] Attending Cardiologist [facility]

## 2013-01-06 LAB — CBC
HCT: 23 % — ABNORMAL LOW (ref 36.0–46.0)
MCH: 29.4 pg (ref 26.0–34.0)
MCHC: 33.5 g/dL (ref 30.0–36.0)
MCV: 87.8 fL (ref 78.0–100.0)
RDW: 13.9 % (ref 11.5–15.5)

## 2013-01-06 LAB — GLUCOSE, CAPILLARY
Glucose-Capillary: 122 mg/dL — ABNORMAL HIGH (ref 70–99)
Glucose-Capillary: 132 mg/dL — ABNORMAL HIGH (ref 70–99)
Glucose-Capillary: 184 mg/dL — ABNORMAL HIGH (ref 70–99)
Glucose-Capillary: 158 mg/dL — ABNORMAL HIGH (ref 70–99)

## 2013-01-06 LAB — RENAL FUNCTION PANEL
Albumin: 2.9 g/dL — ABNORMAL LOW (ref 3.5–5.2)
BUN: 23 mg/dL (ref 6–23)
Calcium: 8.5 mg/dL (ref 8.4–10.5)
Creatinine, Ser: 1.61 mg/dL — ABNORMAL HIGH (ref 0.50–1.10)
Glucose, Bld: 128 mg/dL — ABNORMAL HIGH (ref 70–99)
Phosphorus: 2.7 mg/dL (ref 2.3–4.6)
Potassium: 4.2 mEq/L (ref 3.5–5.1)

## 2013-01-06 LAB — C4 COMPLEMENT: Complement C4, Body Fluid: 25 mg/dL (ref 10–40)

## 2013-01-06 LAB — RETICULOCYTES: Retic Ct Pct: 1.5 % (ref 0.4–3.1)

## 2013-01-06 LAB — PREPARE RBC (CROSSMATCH)

## 2013-01-06 LAB — HEMOGLOBIN AND HEMATOCRIT, BLOOD: HCT: 26.4 % — ABNORMAL LOW (ref 36.0–46.0)

## 2013-01-06 MED ORDER — FERUMOXYTOL INJECTION 510 MG/17 ML
510.0000 mg | Freq: Once | INTRAVENOUS | Status: AC
  Administered 2013-01-06: 510 mg via INTRAVENOUS
  Filled 2013-01-06: qty 17

## 2013-01-06 MED ORDER — POLYETHYLENE GLYCOL 3350 17 G PO PACK
17.0000 g | PACK | Freq: Every day | ORAL | Status: DC | PRN
  Administered 2013-01-07 – 2013-01-10 (×3): 17 g via ORAL
  Filled 2013-01-06 (×3): qty 1

## 2013-01-06 MED ORDER — DARBEPOETIN ALFA-POLYSORBATE 100 MCG/0.5ML IJ SOLN
100.0000 ug | INTRAMUSCULAR | Status: DC
  Administered 2013-01-06: 100 ug via SUBCUTANEOUS
  Filled 2013-01-06 (×2): qty 0.5

## 2013-01-06 MED ORDER — DOCUSATE SODIUM 100 MG PO CAPS
100.0000 mg | ORAL_CAPSULE | Freq: Every day | ORAL | Status: DC
  Administered 2013-01-06 – 2013-01-11 (×6): 100 mg via ORAL
  Filled 2013-01-06 (×6): qty 1

## 2013-01-06 NOTE — Progress Notes (Signed)
Patient ID: Megan Mathews, female   DOB: [birth year], 79 y.o.   MRN: [id number] S:no recurrent CP O:BP 173/68  Pulse 79  Temp(Src) 98.1 F (36.7 C) (Oral)  Resp 20  Ht 5\' 1"  (1.549 m)  Wt 78.1 kg (172 lb 2.9 oz)  BMI 32.55 kg/m2  SpO2 99%  Intake/Output Summary (Last 24 hours) at 01/06/13 1115 Last data filed at 01/06/13 1000  Gross per 24 hour  Intake   2370 ml  Output   2175 ml  Net    195 ml   Intake/Output: I/O last 3 completed shifts: In: 3495 [P.O.:1020; I.V.:2475] Out: 3425 [Urine:3425]  Intake/Output this shift:  Total I/O In: 225 [I.V.:225] Out: 300 [Urine:300] Weight change:  WUJ:WJXBJYN, frail, WF lying in bed wearing O2 CVS:no rub Resp:bibasilar crackles WGN:FAOZH Ext:no edema   Recent Labs Lab 01/02/13 2027 01/03/13 1110 01/03/13 1557 01/04/13 0508 01/05/13 0425 01/06/13 0553  NA 142 145 141 144 144 143  K 4.1 3.5 3.7 3.8 4.1 4.2  CL 105 108 106 109 108 108  CO2 24 25 23 25 27 25   GLUCOSE 212* 129* 244* 107* 139* 128*  BUN 27* 20 19 22 23 23   CREATININE 1.73* 1.55* 1.57* 1.72* 1.95* 1.61*  ALBUMIN  --   --   --   --   --  2.9*  CALCIUM 9.3 9.3 9.2 8.8 8.9 8.5  PHOS  --   --   --   --   --  2.7   Liver Function Tests:  Recent Labs Lab 01/06/13 0553  ALBUMIN 2.9*   No results found for this basename: LIPASE, AMYLASE,  in the last 168 hours No results found for this basename: AMMONIA,  in the last 168 hours CBC:  Recent Labs Lab 01/02/13 2027 01/03/13 1110 01/04/13 0508 01/05/13 0425 01/06/13 0553  WBC 9.7 10.9* 8.5 9.2 10.9*  NEUTROABS 7.6  --   --   --   --   HGB 8.6* 8.7* 7.5* 8.3* 7.7*  HCT 25.3* 26.5* 22.9* 25.3* 23.0*  MCV 87.8 88.9 89.8 89.4 87.8  PLT 281 292 271 282 241   Cardiac Enzymes:  Recent Labs Lab 01/02/13 2027 01/02/13 2357 01/03/13 1110 01/03/13 1558 01/03/13 1956  TROPONINI <0.30 0.83* 0.89* 0.44* 0.74*   CBG:  Recent Labs Lab 01/05/13 0825 01/05/13 1204 01/05/13 1735 01/05/13 2257  01/06/13 0731  GLUCAP 110* 141* 123* 161* 122*    Iron Studies:  Recent Labs  01/05/13 0927  IRON 68  TIBC 380  TRANSFERRIN 277  FERRITIN 12   Studies/Results: No results found. Marland Kitchen amLODipine  10 mg Oral Daily  . aspirin EC  325 mg Oral Daily  . atorvastatin  40 mg Oral QHS  . clopidogrel  75 mg Oral QAC breakfast  . docusate sodium  100 mg Oral Daily  . ferrous sulfate  325 mg Oral TID WC  . FLUoxetine  20 mg Oral Daily  . glimepiride  2 mg Oral Q breakfast  . heparin  5,000 Units Subcutaneous Q8H  . insulin aspart  0-9 Units Subcutaneous TID WC  . isosorbide mononitrate  90 mg Oral Daily  . metoprolol succinate  75 mg Oral Daily  . oxybutynin  10 mg Oral Daily  . pantoprazole  40 mg Oral Daily  . potassium chloride  20 mEq Oral Daily  . sodium chloride  3 mL Intravenous Q12H    BMET    Component Value Date/Time   NA 143 01/06/2013 0553  K 4.2 01/06/2013 0553   CL 108 01/06/2013 0553   CO2 25 01/06/2013 0553   GLUCOSE 128* 01/06/2013 0553   BUN 23 01/06/2013 0553   CREATININE 1.61* 01/06/2013 0553   CALCIUM 8.5 01/06/2013 0553   GFRNONAA 30* 01/06/2013 0553   GFRAA 34* 01/06/2013 0553   CBC    Component Value Date/Time   WBC 10.9* 01/06/2013 0553   RBC 2.62* 01/06/2013 0553   HGB 7.7* 01/06/2013 0553   HCT 23.0* 01/06/2013 0553   PLT 241 01/06/2013 0553   MCV 87.8 01/06/2013 0553   MCH 29.4 01/06/2013 0553   MCHC 33.5 01/06/2013 0553   RDW 13.9 01/06/2013 0553   LYMPHSABS 1.1 01/02/2013 2027   MONOABS 0.7 01/02/2013 2027   EOSABS 0.2 01/02/2013 2027   BASOSABS 0.1 01/02/2013 2027     Assessment/Plan:  1. AKI/CKD- non-oliguric. pt with extensive atherosclerotic vascular disease and h/o bilateral RAS (nonobstructive by duplex in 12/13) in setting of SVT and HR in 200's. ?transient relative hypotension causing ischemic atn. Also on DDx would be occlusion of Renal arteries, although no embolic changes of toes for atheroemboli. ?plaque rupture. Will check studies and consider reimaging renal  arteries with non contrasted MRI vs duplex. Will check urine eos and complements for ?spontaneous atheroembolic episode.  1. Scr is improving 2. Hold off on cardiac cath.  Pt has poor functional status at baseline and she has an increased risk for contrast-induced nephropaty and would be a poor dialysis candidate.  i would recommend medical management as her symptoms started after SVT and cannot do much physically at home (so symptoms are not limiting her QOL) and feel that her risks outweigh any benefits at this time. 3. Keep Hgb above 8 2. NSTEMI- induced by demand ischemia from SVT. Hold off on cath due to rising Scr 3. Normocytic anemia- may be due to CKD. Agree with further GI workup. Will also check SPEP/UPEP, stool cards, iron. Will start EPO but wont see an effect for 2-4weeks. May need transfusion to keep her Hgb >8 followed by IV lasix 4. HTN- per cardiology 5. DM- per primary svc. 6. Bilateral RAS- as above 7. PVD- per cardiology 8. COPD- stable 9. Dispo- per Primary svc.  Will need PT/OT and possible SNF placement [facility]

## 2013-01-06 NOTE — Progress Notes (Signed)
Report from Night RN. Chart reviewed together. Handoff complete.  

## 2013-01-06 NOTE — Progress Notes (Signed)
[facility]  DAILY PROGRESS NOTE   Subjective:  Creatinine improved slightly today. No complaints overnight. Appreciate renal and GI input. Urine shows low specific gravity and hyaline casts, suggesting possible dehydration?  Urine sodium is not low, however, she is on lasix. H/H is again low today at 7/23.  Objective:  Temp:  [98.1 F (36.7 Mathews)-98.8 F (37.1 Mathews)] 98.1 F (36.7 Mathews) (05/03 0728) Pulse Rate:  [79] 79 (05/02 2000) Resp:  [18-20] 20 (05/03 0728) BP: (144-179)/(30-97) 165/97 mmHg (05/03 0500) SpO2:  [93 %-99 %] 97 % (05/03 0728) Weight change:   Intake/Output from previous day: 05/02 0701 - 05/03 0700 In: 2670 [P.O.:1020; I.V.:1650] Out: 2325 [Urine:2325]  Intake/Output from this shift: Total I/O In: -  Out: 300 [Urine:300]  Medications: Current Facility-Administered Medications  Medication Dose Route Frequency Provider Last Rate Last Dose  . 0.9 %  sodium chloride infusion   Intravenous Continuous Eduard Clos, MD      . 0.9 %  sodium chloride infusion   Intravenous Continuous Brittainy Simmons, PA-Mathews 75 mL/hr at 01/06/13 0636    . acetaminophen (TYLENOL) tablet 650 mg  650 mg Oral Q6H PRN Eduard Clos, MD   650 mg at 01/06/13 0009   Or  . acetaminophen (TYLENOL) suppository 650 mg  650 mg Rectal Q6H PRN Eduard Clos, MD      . albuterol (PROVENTIL) (5 MG/ML) 0.5% nebulizer solution 2.5 mg  2.5 mg Nebulization Q4H PRN Eduard Clos, MD   2.5 mg at 01/06/13 0348  . ALPRAZolam Prudy Feeler) tablet 0.25 mg  0.25 mg Oral BID PRN Eduard Clos, MD      . amLODipine (NORVASC) tablet 10 mg  10 mg Oral Daily Marykay Lex, MD   10 mg at 01/05/13 1002  . aspirin EC tablet 325 mg  325 mg Oral Daily Eduard Clos, MD   325 mg at 01/05/13 1003  . atorvastatin (LIPITOR) tablet 40 mg  40 mg Oral QHS Eduard Clos, MD   40 mg at 01/06/13 0011  . clopidogrel (PLAVIX) tablet 75 mg  75 mg Oral QAC breakfast Eduard Clos, MD   75 mg at 01/05/13 0826  . ferrous sulfate tablet 325 mg  325 mg Oral TID WC Mihai Croitoru, MD   325 mg at 01/05/13 1741  . FLUoxetine (PROZAC) capsule 20 mg  20 mg Oral Daily Eduard Clos, MD   20 mg at 01/05/13 1003  . furosemide (LASIX) tablet 40 mg  40 mg Oral Daily Eduard Clos, MD   40 mg at 01/05/13 1003  . glimepiride (AMARYL) tablet 2 mg  2 mg Oral Q breakfast Eduard Clos, MD   2 mg at 01/05/13 0827  . heparin injection 5,000 Units  5,000 Units Subcutaneous Q8H Mihai Croitoru, MD   5,000 Units at 01/06/13 5366  . insulin aspart (novoLOG) injection 0-9 Units  0-9 Units Subcutaneous TID WC Eduard Clos, MD   1 Units at 01/05/13 1741  . isosorbide mononitrate (IMDUR) 24 hr tablet 90 mg  90 mg Oral Daily Eduard Clos, MD   90 mg at 01/05/13 1003  . metoprolol succinate (TOPROL-XL) 24 hr tablet 75 mg  75 mg Oral Daily Chrystie Nose, MD   75 mg at 01/05/13 1002  . nitroGLYCERIN (NITROSTAT) SL tablet 0.4 mg  0.4 mg Sublingual Q5 min PRN Eduard Clos, MD      . ondansetron [facility])  tablet 4 mg  4 mg Oral Q6H PRN Eduard Clos, MD       Or  . ondansetron [facility]) injection 4 mg  4 mg Intravenous Q6H PRN Eduard Clos, MD      . oxybutynin (DITROPAN-XL) 24 hr tablet 10 mg  10 mg Oral Daily Eduard Clos, MD   10 mg at 01/05/13 1003  . pantoprazole (PROTONIX) EC tablet 40 mg  40 mg Oral Daily Eduard Clos, MD   40 mg at 01/05/13 1002  . potassium chloride SA (K-DUR,KLOR-CON) CR tablet 20 mEq  20 mEq Oral Daily Eduard Clos, MD   20 mEq at 01/05/13 1003  . sodium chloride 0.9 % injection 3 mL  3 mL Intravenous Q12H Eduard Clos, MD   3 mL at 01/05/13 2200    Physical Exam: General appearance: alert, no distress and significant hearing impairment Neck: no adenopathy, no carotid bruit, no JVD, supple, symmetrical, trachea midline and thyroid not enlarged, symmetric, no  tenderness/mass/nodules Lungs: clear to auscultation bilaterally Heart: regular rate and rhythm, S1, S2 normal, no murmur, click, rub or gallop Abdomen: soft, non-tender; bowel sounds normal; no masses,  no organomegaly Extremities: extremities normal, atraumatic, no cyanosis or edema Pulses: 2+ and symmetric Skin: Skin color, texture, turgor normal. No rashes or lesions Neurologic: Grossly normal  Lab Results: Results for orders placed during the hospital encounter of 01/02/13 (from the past 48 hour(s))  GLUCOSE, CAPILLARY     Status: Abnormal   Collection Time    01/04/13  1:41 PM      Result Value Range   Glucose-Capillary 160 (*) 70 - 99 mg/dL  GLUCOSE, CAPILLARY     Status: Abnormal   Collection Time    01/04/13  4:09 PM      Result Value Range   Glucose-Capillary 127 (*) 70 - 99 mg/dL  GLUCOSE, CAPILLARY     Status: Abnormal   Collection Time    01/04/13  7:55 PM      Result Value Range   Glucose-Capillary 183 (*) 70 - 99 mg/dL  HEPARIN LEVEL (UNFRACTIONATED)     Status: Abnormal   Collection Time    01/05/13  4:25 AM      Result Value Range   Heparin Unfractionated <0.10 (*) 0.30 - 0.70 IU/mL   Comment:            IF HEPARIN RESULTS ARE BELOW     EXPECTED VALUES, AND PATIENT     DOSAGE HAS BEEN CONFIRMED,     SUGGEST FOLLOW UP TESTING     OF ANTITHROMBIN III LEVELS.  CBC     Status: Abnormal   Collection Time    01/05/13  4:25 AM      Result Value Range   WBC 9.2  4.0 - 10.5 K/uL   RBC 2.83 (*) 3.87 - 5.11 MIL/uL   Hemoglobin 8.3 (*) 12.0 - 15.0 g/dL   HCT 47.8 (*) 29.5 - 62.1 %   MCV 89.4  78.0 - 100.0 fL   MCH 29.3  26.0 - 34.0 pg   MCHC 32.8  30.0 - 36.0 g/dL   RDW 30.8  65.7 - 84.6 %   Platelets 282  150 - 400 K/uL  BASIC METABOLIC PANEL     Status: Abnormal   Collection Time    01/05/13  4:25 AM      Result Value Range   Sodium 144  135 - 145 mEq/L   Potassium 4.1  3.5 - 5.1 mEq/L   Chloride 108  96 - 112 mEq/L   CO2 27  19 - 32 mEq/L   Glucose,  Bld 139 (*) 70 - 99 mg/dL   BUN 23  6 - 23 mg/dL   Creatinine, Ser 1.61 (*) 0.50 - 1.10 mg/dL   Calcium 8.9  8.4 - 09.6 mg/dL   GFR calc non Af Amer 23 (*) >90 mL/min   GFR calc Af Amer 27 (*) >90 mL/min   Comment:            The eGFR has been calculated     using the CKD EPI equation.     This calculation has not been     validated in all clinical     situations.     eGFR's persistently     <90 mL/min signify     possible Chronic Kidney Disease.  GLUCOSE, CAPILLARY     Status: Abnormal   Collection Time    01/05/13  8:25 AM      Result Value Range   Glucose-Capillary 110 (*) 70 - 99 mg/dL  IRON AND TIBC     Status: Abnormal   Collection Time    01/05/13  9:27 AM      Result Value Range   Iron 68  42 - 135 ug/dL   TIBC 045  409 - 811 ug/dL   Saturation Ratios 18 (*) 20 - 55 %   UIBC 312  125 - 400 ug/dL  FERRITIN     Status: None   Collection Time    01/05/13  9:27 AM      Result Value Range   Ferritin 12  10 - 291 ng/mL  LACTATE DEHYDROGENASE     Status: None   Collection Time    01/05/13  9:27 AM      Result Value Range   LDH 202  94 - 250 U/L  TRANSFERRIN     Status: None   Collection Time    01/05/13  9:27 AM      Result Value Range   Transferrin 277  200 - 360 mg/dL  GLUCOSE, CAPILLARY     Status: Abnormal   Collection Time    01/05/13 12:04 PM      Result Value Range   Glucose-Capillary 141 (*) 70 - 99 mg/dL  C3 COMPLEMENT     Status: None   Collection Time    01/05/13  4:19 PM      Result Value Range   C3 Complement 160  90 - 180 mg/dL  C4 COMPLEMENT     Status: None   Collection Time    01/05/13  4:19 PM      Result Value Range   Complement C4, Body Fluid 25  10 - 40 mg/dL  GLUCOSE, CAPILLARY     Status: Abnormal   Collection Time    01/05/13  5:35 PM      Result Value Range   Glucose-Capillary 123 (*) 70 - 99 mg/dL  SODIUM, URINE, RANDOM     Status: None   Collection Time    01/05/13  5:56 PM      Result Value Range   Sodium, Ur 97     CREATININE, URINE, RANDOM     Status: None   Collection Time    01/05/13  5:56 PM      Result Value Range   Creatinine, Urine 40.62    URINALYSIS, ROUTINE W REFLEX MICROSCOPIC     Status:  Abnormal   Collection Time    01/05/13  5:57 PM      Result Value Range   Color, Urine YELLOW  YELLOW   APPearance CLEAR  CLEAR   Specific Gravity, Urine 1.012  1.005 - 1.030   pH 5.0  5.0 - 8.0   Glucose, UA NEGATIVE  NEGATIVE mg/dL   Hgb urine dipstick NEGATIVE  NEGATIVE   Bilirubin Urine NEGATIVE  NEGATIVE   Ketones, ur NEGATIVE  NEGATIVE mg/dL   Protein, ur 30 (*) NEGATIVE mg/dL   Urobilinogen, UA 0.2  0.0 - 1.0 mg/dL   Nitrite NEGATIVE  NEGATIVE   Leukocytes, UA NEGATIVE  NEGATIVE  URINE MICROSCOPIC-ADD ON     Status: Abnormal   Collection Time    01/05/13  5:57 PM      Result Value Range   Squamous Epithelial / LPF RARE  RARE   WBC, UA 0-2  <3 WBC/hpf   Casts HYALINE CASTS (*) NEGATIVE   Urine-Other MUCOUS PRESENT    GLUCOSE, CAPILLARY     Status: Abnormal   Collection Time    01/05/13 10:57 PM      Result Value Range   Glucose-Capillary 161 (*) 70 - 99 mg/dL  CBC     Status: Abnormal   Collection Time    01/06/13  5:53 AM      Result Value Range   WBC 10.9 (*) 4.0 - 10.5 K/uL   RBC 2.62 (*) 3.87 - 5.11 MIL/uL   Hemoglobin 7.7 (*) 12.0 - 15.0 g/dL   HCT 16.1 (*) 09.6 - 04.5 %   MCV 87.8  78.0 - 100.0 fL   MCH 29.4  26.0 - 34.0 pg   MCHC 33.5  30.0 - 36.0 g/dL   RDW 40.9  81.1 - 91.4 %   Platelets 241  150 - 400 K/uL  RENAL FUNCTION PANEL     Status: Abnormal   Collection Time    01/06/13  5:53 AM      Result Value Range   Sodium 143  135 - 145 mEq/L   Potassium 4.2  3.5 - 5.1 mEq/L   Chloride 108  96 - 112 mEq/L   CO2 25  19 - 32 mEq/L   Glucose, Bld 128 (*) 70 - 99 mg/dL   BUN 23  6 - 23 mg/dL   Creatinine, Ser 7.82 (*) 0.50 - 1.10 mg/dL   Calcium 8.5  8.4 - 95.6 mg/dL   Phosphorus 2.7  2.3 - 4.6 mg/dL   Albumin 2.9 (*) 3.5 - 5.2 g/dL   GFR calc non Af Amer 30  (*) >90 mL/min   GFR calc Af Amer 34 (*) >90 mL/min   Comment:            The eGFR has been calculated     using the CKD EPI equation.     This calculation has not been     validated in all clinical     situations.     eGFR's persistently     <90 mL/min signify     possible Chronic Kidney Disease.  GLUCOSE, CAPILLARY     Status: Abnormal   Collection Time    01/06/13  7:31 AM      Result Value Range   Glucose-Capillary 122 (*) 70 - 99 mg/dL   Comment 1 Notify RN      Imaging: No results found.  Assessment:  1. Principal Problem: 2.   Non-ST elevation MI (NSTEMI) 3. Active Problems: 4.  OBSTRUCTIVE SLEEP APNEA 5.   COPD 6.   Anemia 7.   Type II or unspecified type diabetes mellitus without mention of complication, uncontrolled 8.   SVT (supraventricular tachycardia) 9.   ARF (acute renal failure) 10.   HTN (hypertension) 11.   HLD (hyperlipidemia) 12.   PVD (peripheral vascular disease) 13.   Loss of weight 14.   Anorexia 15.   Plan:  1. She feels well. No chest pain. No further SVT. Renal function improved somewhat today, may be from hydration - urine does appear to be what is seen with dehydration.  EF is normal (55-60%) on echo - hold lasix. With regard to her anemia, her iron stores seem inadequate with a low Ferritin and high transferrin, despite an adequate free iron and normal TIBC.  Transferrin sat is mildly reduced. This may suggest mixed iron deficiency and ACD. With renal disease, she may benefit from EPO and low dose iron supplementation. Will defer to renal regarding epo. Plan for renal dopplers today to r/o RAS.  No plans for cardiac cath until next week if renal function improves.   Time Spent Directly with Patient:  15 minutes  Length of Stay:  LOS: 4 days   Chrystie Nose, MD, [facility] Attending Cardiologist [facility]  Megan Mathews 01/06/2013, 8:18 AM

## 2013-01-06 NOTE — Progress Notes (Signed)
Agree with Ms. Gribbin's assessment and plan. Carl E. Gessner, MD, FACG  

## 2013-01-06 NOTE — Progress Notes (Signed)
     [facility] Daily Rounding Note 01/06/2013, 12:03 PM  SUBJECTIVE:       No nausea, no abdominal pain.  Appetite variable.  Tolerating solids.  No balck or bloody stool.   OBJECTIVE:         Vital signs in last 24 hours:    Temp:  [98.1 F (36.7 C)-98.8 F (37.1 C)] 98.5 F (36.9 C) (05/03 1153) Pulse Rate:  [79] 79 (05/02 2000) Resp:  [20] 20 (05/03 1153) BP: (144-173)/(44-97) 173/68 mmHg (05/03 1002) SpO2:  [94 %-99 %] 98 % (05/03 1153) Last BM Date: 01/02/13 General: frail, dyspneic, ill looking   Heart: RRR Chest: clear B.  Dyspneic with minor effort.  Abdomen: obese, soft, ND, NT.  Extremities: no CCE Neuro/Psych:  Pleasant, HOH, +UE tremor.   Intake/Output from previous day: 05/02 0701 - 05/03 0700 In: 2745 [P.O.:1020; I.V.:1725] Out: 2325 [Urine:2325]  Intake/Output this shift: Total I/O In: 225 [I.V.:225] Out: 300 [Urine:300]  Lab Results:  Recent Labs  01/04/13 0508 01/05/13 0425 01/06/13 0553  WBC 8.5 9.2 10.9*  HGB 7.5* 8.3* 7.7*  HCT 22.9* 25.3* 23.0*  PLT 271 282 241   Ferritin   68 Iron 29 TIBC 380 Iron sat  18%  BMET  Recent Labs  01/04/13 0508 01/05/13 0425 01/06/13 0553  NA 144 144 143  K 3.8 4.1 4.2  CL 109 108 108  CO2 25 27 25   GLUCOSE 107* 139* 128*  BUN 22 23 23   CREATININE 1.72* 1.95* 1.61*  CALCIUM 8.8 8.9 8.5   LFT  Recent Labs  01/06/13 0553  ALBUMIN 2.9*   PT/INR  Recent Labs  01/04/13 0508  LABPROT 14.4  INR 1.14    ASSESMENT: * Normocytic anemia.  Ferritin, iron sat is low but Iron level ok.  This is anemia of chronic disease. Hgb dropped and she is to get transfused today.  Hx heme negative anemia 2012 with minor gastritis on EGD. Colonoscopies in 1997 and 2008 without polyps, AVMs, colitis etc: Just diverticulosis.  * 30 # weight loss in 5 to 6 months. Anorexia, mild queasiness and food aversion which has improved since admission. Reviewed cards office note for 11/14/12 and no PPI or H2 blocker are  on the med list, which supports conclusion she was not taking any GI protective med.  Non-contrast CT scan in 07/2012 did not show any GI issues other than hepatic steatosis and  extensive vascular disease.  Current GI sxs could represent mesenteric vascular insufficiency, though not having much if any post prandial abdominal pain.  * Non STEMI. Cardiac cath on hold secondary to low GFR  *  SVT, resolved.  * CKD. GFR is 23. Dr Abel Presto is seeing her now, feels some of the anemia can be explained by renal disease. Creatitnine improving.  * Type 2 DM. Not insulin requiring at home.  * Chronic Plavix for extensive hx stenting, CAD and PVD  * OSA, COPD. On bipap at home. Chronic hypoxic resp failure per Dr Evlyn Courier office notes.  * Fatty liver on 07/2012 CT scan. PT/INR not abnormal, LFTs normal.     PLAN: *  No plans for colonoscopy.  *  May benefit from EPO but will let Dr Abel Presto decide when to initiate this.  *  Cardiac cath next week?   LOS: 4 days   Megan Mathews  01/06/2013, 12:03 PM Pager: [phone]

## 2013-01-07 LAB — TYPE AND SCREEN
Unit division: 0
Unit division: 0

## 2013-01-07 LAB — GLUCOSE, CAPILLARY
Glucose-Capillary: 210 mg/dL — ABNORMAL HIGH (ref 70–99)
Glucose-Capillary: 146 mg/dL — ABNORMAL HIGH (ref 70–99)
Glucose-Capillary: 138 mg/dL — ABNORMAL HIGH (ref 70–99)

## 2013-01-07 LAB — CBC
HCT: 29.9 % — ABNORMAL LOW (ref 36.0–46.0)
MCHC: 33.8 g/dL (ref 30.0–36.0)
MCV: 83.8 fL (ref 78.0–100.0)
Platelets: 258 10*3/uL (ref 150–400)
RDW: 18.3 % — ABNORMAL HIGH (ref 11.5–15.5)
WBC: 15.6 10*3/uL — ABNORMAL HIGH (ref 4.0–10.5)

## 2013-01-07 LAB — BASIC METABOLIC PANEL
BUN: 24 mg/dL — ABNORMAL HIGH (ref 6–23)
Chloride: 108 mEq/L (ref 96–112)
Creatinine, Ser: 1.53 mg/dL — ABNORMAL HIGH (ref 0.50–1.10)
GFR calc Af Amer: 36 mL/min — ABNORMAL LOW (ref >90)
GFR calc non Af Amer: 31 mL/min — ABNORMAL LOW (ref >90)

## 2013-01-07 LAB — TROPONIN I: Troponin I: <0.3 ng/mL (ref <0.30)

## 2013-01-07 MED ORDER — ALBUTEROL SULFATE (5 MG/ML) 0.5% IN NEBU
2.5000 mg | INHALATION_SOLUTION | RESPIRATORY_TRACT | Status: DC
  Administered 2013-01-07 – 2013-01-09 (×12): 2.5 mg via RESPIRATORY_TRACT
  Filled 2013-01-07 (×13): qty 0.5

## 2013-01-07 MED ORDER — FUROSEMIDE 10 MG/ML IJ SOLN
40.0000 mg | Freq: Once | INTRAMUSCULAR | Status: AC
  Administered 2013-01-07: 40 mg via INTRAVENOUS
  Filled 2013-01-07: qty 4

## 2013-01-07 MED ORDER — FUROSEMIDE 40 MG PO TABS
40.0000 mg | ORAL_TABLET | Freq: Every day | ORAL | Status: DC
  Administered 2013-01-07 – 2013-01-09 (×3): 40 mg via ORAL
  Filled 2013-01-07 (×3): qty 1

## 2013-01-07 MED ORDER — IPRATROPIUM BROMIDE 0.02 % IN SOLN
0.5000 mg | RESPIRATORY_TRACT | Status: DC
  Administered 2013-01-07 – 2013-01-09 (×12): 0.5 mg via RESPIRATORY_TRACT
  Filled 2013-01-07 (×13): qty 2.5

## 2013-01-07 NOTE — Progress Notes (Signed)
Patient ID: Megan Mathews, female   DOB: June 10, 1932, 79 y.o.   MRN: [id number] S:feels better O:BP 165/46  Pulse 83  Temp(Src) 98 F (36.7 C) (Oral)  Resp 20  Ht 5\' 1"  (1.549 m)  Wt 78.1 kg (172 lb 2.9 oz)  BMI 32.55 kg/m2  SpO2 94%  Intake/Output Summary (Last 24 hours) at 01/07/13 1231 Last data filed at 01/07/13 0900  Gross per 24 hour  Intake 2227.5 ml  Output   2010 ml  Net  217.5 ml   Intake/Output: I/O last 3 completed shifts: In: 3648.8 [P.O.:240; I.V.:2700; Blood:708.8] Out: 3085 [Urine:3085]  Intake/Output this shift:  Total I/O In: 93.8 [I.V.:93.8] Out: 350 [Urine:350] Weight change:  IEP:PIRJ, obese, chronically ill-appearing WF in NAD CVS:no rub Resp:bibasilar crackles JOA:CZYSAY Ext:tr edema   Recent Labs Lab 01/02/13 2027 01/03/13 1110 01/03/13 1557 01/04/13 0508 01/05/13 0425 01/06/13 0553 01/07/13 0430  NA 142 145 141 144 144 143 142  K 4.1 3.5 3.7 3.8 4.1 4.2 3.9  CL 105 108 106 109 108 108 108  CO2 24 25 23 25 27 25 22   GLUCOSE 212* 129* 244* 107* 139* 128* 184*  BUN 27* 20 19 22 23 23  24*  CREATININE 1.73* 1.55* 1.57* 1.72* 1.95* 1.61* 1.53*  ALBUMIN  --   --   --   --   --  2.9*  --   CALCIUM 9.3 9.3 9.2 8.8 8.9 8.5 8.9  PHOS  --   --   --   --   --  2.7  --    Liver Function Tests:  Recent Labs Lab 01/06/13 0553  ALBUMIN 2.9*   No results found for this basename: LIPASE, AMYLASE,  in the last 168 hours No results found for this basename: AMMONIA,  in the last 168 hours CBC:  Recent Labs Lab 01/02/13 2027 01/03/13 1110 01/04/13 0508 01/05/13 0425 01/06/13 0553 01/06/13 2112 01/07/13 0430  WBC 9.7 10.9* 8.5 9.2 10.9*  --  15.6*  NEUTROABS 7.6  --   --   --   --   --   --   HGB 8.6* 8.7* 7.5* 8.3* 7.7* 9.0* 10.1*  HCT 25.3* 26.5* 22.9* 25.3* 23.0* 26.4* 29.9*  MCV 87.8 88.9 89.8 89.4 87.8  --  83.8  PLT 281 292 271 282 241  --  258   Cardiac Enzymes:  Recent Labs Lab 01/02/13 2357 01/03/13 1110 01/03/13 1558  01/03/13 1956 01/07/13 0430  TROPONINI 0.83* 0.89* 0.44* 0.74* <0.30   CBG:  Recent Labs Lab 01/06/13 1155 01/06/13 1629 01/06/13 2156 01/07/13 0757 01/07/13 1135  GLUCAP 132* 184* 158* 148* 210*    Iron Studies:  Recent Labs  01/05/13 0927  IRON 68  TIBC 380  TRANSFERRIN 277  FERRITIN 12   Studies/Results: No results found. Marland Kitchen albuterol  2.5 mg Nebulization Q4H WA  . amLODipine  10 mg Oral Daily  . aspirin EC  325 mg Oral Daily  . atorvastatin  40 mg Oral QHS  . clopidogrel  75 mg Oral QAC breakfast  . darbepoetin (ARANESP) injection - NON-DIALYSIS  100 mcg Subcutaneous Q Sat-1800  . docusate sodium  100 mg Oral Daily  . ferrous sulfate  325 mg Oral TID WC  . FLUoxetine  20 mg Oral Daily  . furosemide  40 mg Oral Daily  . glimepiride  2 mg Oral Q breakfast  . heparin  5,000 Units Subcutaneous Q8H  . insulin aspart  0-9 Units Subcutaneous TID WC  .  ipratropium  0.5 mg Nebulization Q4H WA  . isosorbide mononitrate  90 mg Oral Daily  . metoprolol succinate  75 mg Oral Daily  . oxybutynin  10 mg Oral Daily  . pantoprazole  40 mg Oral Daily  . potassium chloride  20 mEq Oral Daily  . sodium chloride  3 mL Intravenous Q12H    BMET    Component Value Date/Time   NA 142 01/07/2013 0430   K 3.9 01/07/2013 0430   CL 108 01/07/2013 0430   CO2 22 01/07/2013 0430   GLUCOSE 184* 01/07/2013 0430   BUN 24* 01/07/2013 0430   CREATININE 1.53* 01/07/2013 0430   CALCIUM 8.9 01/07/2013 0430   GFRNONAA 31* 01/07/2013 0430   GFRAA 36* 01/07/2013 0430   CBC    Component Value Date/Time   WBC 15.6* 01/07/2013 0430   RBC 3.57* 01/07/2013 0430   HGB 10.1* 01/07/2013 0430   HCT 29.9* 01/07/2013 0430   PLT 258 01/07/2013 0430   MCV 83.8 01/07/2013 0430   MCH 28.3 01/07/2013 0430   MCHC 33.8 01/07/2013 0430   RDW 18.3* 01/07/2013 0430   LYMPHSABS 1.1 01/02/2013 2027   MONOABS 0.7 01/02/2013 2027   EOSABS 0.2 01/02/2013 2027   BASOSABS 0.1 01/02/2013 2027     Assessment/Plan:  1. AKI/CKD- non-oliguric. pt  with extensive atherosclerotic vascular disease and h/o bilateral RAS (nonobstructive by duplex in 12/13) in setting of SVT and HR in 200's. ?transient relative hypotension causing ischemic atn. Also on DDx would be occlusion of Renal arteries, although no embolic changes of toes for atheroemboli. ?plaque rupture. Will check studies and consider reimaging renal arteries with non contrasted MRI vs duplex. Will check urine eos and complements for ?spontaneous atheroembolic episode.  1. Scr is improving 2. Hold off on cardiac cath. Pt has poor functional status at baseline and she has an increased risk for contrast-induced nephropaty and would be a poor dialysis candidate. I would recommend medical management as her symptoms started after SVT and cannot do much physically at home (so symptoms are not limiting her QOL) and feel that her risks outweigh any benefits at this time. 3. Keep Hgb above 8 2. NSTEMI- induced by demand ischemia from SVT. Hold off on cath due to rising Scr 3. Normocytic anemia- may be due to CKD. Agree with further GI workup. Will also check SPEP/UPEP, stool cards, iron. Will start EPO but wont see an effect for 2-4weeks. Feels better after transfusion and agree with stopping IVF's and start po lasix.  Goal to keep her Hgb >8. 4. HTN- per cardiology 5. DM- per primary svc. 6. Bilateral RAS- as above 7. PVD- per cardiology 8. COPD- stable 9. Dispo- per Primary svc. Will need PT/OT and possible SNF placement.  May also benefit from [facility].  Overall pt has poor functional status and has high risk for CIN and therefore would really question risk/benefit of cardiac cath. 10.   Jerry Haugen A

## 2013-01-07 NOTE — Progress Notes (Signed)
Placed pt. On CPAP via FFM (what pt. Wears at home) auto titrate (min: 5, max: 15) Pt. Is unaware of home settings. Pt. Is tolerating CPAP well at this time without any complications. Pt. Was made aware to call RT if she needed assistance with CPAP during the night.

## 2013-01-07 NOTE — Progress Notes (Signed)
01/07/13 1615  Vitals  BP ! 162/52 mmHg  MAP (mmHg) 95  ECG Heart Rate 82  Oxygen Therapy  SpO2 95 %  O2 Device Nasal cannula  O2 Flow Rate (L/min) 3 L/min  Pt noted to be short of breath with productive cough. Rhonchi noted in lang sounds. Md advised, new orders given. Will continue to monitor and advise attending as needed.

## 2013-01-07 NOTE — Progress Notes (Addendum)
[facility] Progress Note  Patient Name: Megan Mathews Date of Encounter: 01/07/2013, 1:45 PM    Subjective  A bit stronger Eating well   Objective    Physical Exam: Filed Vitals:   01/07/13 1137  BP: 165/46  Pulse:   Temp: 98 F (36.7 C)  Resp: 20   General: obese, pale and chronically ill     Intake/Output Summary (Last 24 hours) at 01/07/13 1345 Last data filed at 01/07/13 0900  Gross per 24 hour  Intake 2152.5 ml  Output   2010 ml  Net  142.5 ml    Labs:  Recent Labs  01/06/13 0553 01/07/13 0430  NA 143 142  K 4.2 3.9  CL 108 108  CO2 25 22  GLUCOSE 128* 184*  BUN 23 24*  CREATININE 1.61* 1.53*  CALCIUM 8.5 8.9  PHOS 2.7  --     Recent Labs  01/06/13 0553  ALBUMIN 2.9*     Recent Labs  01/06/13 0553 01/06/13 2112 01/07/13 0430  WBC 10.9*  --  15.6*  HGB 7.7* 9.0* 10.1*  HCT 23.0* 26.4* 29.9*  MCV 87.8  --  83.8  PLT 241  --  258     Recent Labs  01/05/13 0927 01/06/13 0553  FERRITIN 12  --   TIBC 380  --   IRON 68  --   RETICCTPCT  --  1.5       Assessment and Plan  Iron-deficiency anemia Recent NSTEMI Chronic kidney disease. Elderly and frail Weight Loss  Given recent MI do not favor performing a colonoscopy at this time. Would treat with iron/EPO If persistent ?'s remain could consider colonoscopy in about 6 weeks - would need referral to our office. Note she is eating well here - I believe she needs assisted living or more to thrive, if that is possible.  Signing off. Call if ?  Iva Boop, MD, [facility] [phone] (pager) 01/07/2013 1:48 PM

## 2013-01-07 NOTE — Progress Notes (Signed)
Report from Night RN. Chart reviewed together. Handoff complete.  

## 2013-01-07 NOTE — Progress Notes (Signed)
[facility]  DAILY PROGRESS NOTE   Subjective:  Received blood yesterday, expect response to 2 units. Have been holding lasix - she has received IV fluids. Creatinine improved to 1.53 today.   Objective:  Temp:  [97.8 F (36.6 C)-99.8 F (37.7 C)] 99.8 F (37.7 C) (05/04 0412) Pulse Rate:  [85-98] 97 (05/04 0500) Resp:  [18-20] 18 (05/04 0023) BP: (126-188)/(37-87) 188/78 mmHg (05/04 0412) SpO2:  [96 %-99 %] 96 % (05/04 0520) Weight change:   Intake/Output from previous day: 05/03 0701 - 05/04 0700 In: 2731.3 [I.V.:1725; Blood:1006.3] Out: 1960 [Urine:1960]  Intake/Output from this shift:    Medications: Current Facility-Administered Medications  Medication Dose Route Frequency Provider Last Rate Last Dose  . 0.9 %  sodium chloride infusion   Intravenous Continuous Eduard Clos, MD      . 0.9 %  sodium chloride infusion   Intravenous Continuous Brittainy Simmons, PA-C 75 mL/hr at 01/06/13 2025    . acetaminophen (TYLENOL) tablet 650 mg  650 mg Oral Q6H PRN Eduard Clos, MD   650 mg at 01/07/13 0408   Or  . acetaminophen (TYLENOL) suppository 650 mg  650 mg Rectal Q6H PRN Eduard Clos, MD      . albuterol (PROVENTIL) (5 MG/ML) 0.5% nebulizer solution 2.5 mg  2.5 mg Nebulization Q4H PRN Eduard Clos, MD   2.5 mg at 01/07/13 0521  . albuterol (PROVENTIL) (5 MG/ML) 0.5% nebulizer solution 2.5 mg  2.5 mg Nebulization Q4H WA Martin Ob Defrancesco, MD      . ALPRAZolam Prudy Feeler) tablet 0.25 mg  0.25 mg Oral BID PRN Eduard Clos, MD   0.25 mg at 01/07/13 0201  . amLODipine (NORVASC) tablet 10 mg  10 mg Oral Daily Marykay Lex, MD   10 mg at 01/06/13 0959  . aspirin EC tablet 325 mg  325 mg Oral Daily Eduard Clos, MD   325 mg at 01/06/13 0959  . atorvastatin (LIPITOR) tablet 40 mg  40 mg Oral QHS Eduard Clos, MD   40 mg at 01/06/13 2210  . clopidogrel (PLAVIX) tablet 75 mg  75 mg Oral QAC breakfast Eduard Clos, MD   75 mg at 01/06/13 1610  . darbepoetin (ARANESP) injection 100 mcg  100 mcg Subcutaneous Q Sat-1800 Irena Cords, MD   100 mcg at 01/06/13 2211  . docusate sodium (COLACE) capsule 100 mg  100 mg Oral Daily Chrystie Nose, MD   100 mg at 01/06/13 0958  . ferrous sulfate tablet 325 mg  325 mg Oral TID WC Mihai Croitoru, MD   325 mg at 01/06/13 1855  . FLUoxetine (PROZAC) capsule 20 mg  20 mg Oral Daily Eduard Clos, MD   20 mg at 01/06/13 0959  . glimepiride (AMARYL) tablet 2 mg  2 mg Oral Q breakfast Eduard Clos, MD   2 mg at 01/06/13 0823  . heparin injection 5,000 Units  5,000 Units Subcutaneous Q8H Mihai Croitoru, MD   5,000 Units at 01/07/13 0610  . insulin aspart (novoLOG) injection 0-9 Units  0-9 Units Subcutaneous TID WC Eduard Clos, MD   2 Units at 01/06/13 1855  . ipratropium (ATROVENT) nebulizer solution 0.5 mg  0.5 mg Nebulization Q4H WA Martin Ob Defrancesco, MD      . isosorbide mononitrate (IMDUR) 24 hr tablet 90 mg  90 mg Oral Daily Eduard Clos, MD   90 mg at 01/06/13 1002  .  metoprolol succinate (TOPROL-XL) 24 hr tablet 75 mg  75 mg Oral Daily Chrystie Nose, MD   75 mg at 01/06/13 0959  . nitroGLYCERIN (NITROSTAT) SL tablet 0.4 mg  0.4 mg Sublingual Q5 min PRN Eduard Clos, MD      . ondansetron [facility]) tablet 4 mg  4 mg Oral Q6H PRN Eduard Clos, MD       Or  . ondansetron [facility]) injection 4 mg  4 mg Intravenous Q6H PRN Eduard Clos, MD      . oxybutynin (DITROPAN-XL) 24 hr tablet 10 mg  10 mg Oral Daily Eduard Clos, MD   10 mg at 01/06/13 1002  . pantoprazole (PROTONIX) EC tablet 40 mg  40 mg Oral Daily Eduard Clos, MD   40 mg at 01/06/13 0958  . polyethylene glycol (MIRALAX / GLYCOLAX) packet 17 g  17 g Oral Daily PRN Chrystie Nose, MD      . potassium chloride SA (K-DUR,KLOR-CON) CR tablet 20 mEq  20 mEq Oral Daily Eduard Clos, MD   20 mEq at 01/06/13 0959  . sodium  chloride 0.9 % injection 3 mL  3 mL Intravenous Q12H Eduard Clos, MD   3 mL at 01/06/13 2211    Physical Exam: General appearance: alert and no distress Neck: no adenopathy, no carotid bruit, no JVD, supple, symmetrical, trachea midline and thyroid not enlarged, symmetric, no tenderness/mass/nodules Lungs: rales bilaterally and wheezes RLL Heart: regular rate and rhythm, S1, S2 normal, no murmur, click, rub or gallop Abdomen: soft, non-tender; bowel sounds normal; no masses,  no organomegaly Extremities: edema trace bilateral Pulses: 2+ and symmetric Skin: less pale then yesterday Neurologic: Grossly normal  Lab Results: Results for orders placed during the hospital encounter of 01/02/13 (from the past 48 hour(s))  GLUCOSE, CAPILLARY     Status: Abnormal   Collection Time    01/05/13  8:25 AM      Result Value Range   Glucose-Capillary 110 (*) 70 - 99 mg/dL  IRON AND TIBC     Status: Abnormal   Collection Time    01/05/13  9:27 AM      Result Value Range   Iron 68  42 - 135 ug/dL   TIBC 130  865 - 784 ug/dL   Saturation Ratios 18 (*) 20 - 55 %   UIBC 312  125 - 400 ug/dL  FERRITIN     Status: None   Collection Time    01/05/13  9:27 AM      Result Value Range   Ferritin 12  10 - 291 ng/mL  LACTATE DEHYDROGENASE     Status: None   Collection Time    01/05/13  9:27 AM      Result Value Range   LDH 202  94 - 250 U/L  TRANSFERRIN     Status: None   Collection Time    01/05/13  9:27 AM      Result Value Range   Transferrin 277  200 - 360 mg/dL  GLUCOSE, CAPILLARY     Status: Abnormal   Collection Time    01/05/13 12:04 PM      Result Value Range   Glucose-Capillary 141 (*) 70 - 99 mg/dL  C3 COMPLEMENT     Status: None   Collection Time    01/05/13  4:19 PM      Result Value Range   C3 Complement 160  90 - 180 mg/dL  C4  COMPLEMENT     Status: None   Collection Time    01/05/13  4:19 PM      Result Value Range   Complement C4, Body Fluid 25  10 - 40 mg/dL   GLUCOSE, CAPILLARY     Status: Abnormal   Collection Time    01/05/13  5:35 PM      Result Value Range   Glucose-Capillary 123 (*) 70 - 99 mg/dL  SODIUM, URINE, RANDOM     Status: None   Collection Time    01/05/13  5:56 PM      Result Value Range   Sodium, Ur 97    CREATININE, URINE, RANDOM     Status: None   Collection Time    01/05/13  5:56 PM      Result Value Range   Creatinine, Urine 40.62    URINALYSIS, ROUTINE W REFLEX MICROSCOPIC     Status: Abnormal   Collection Time    01/05/13  5:57 PM      Result Value Range   Color, Urine YELLOW  YELLOW   APPearance CLEAR  CLEAR   Specific Gravity, Urine 1.012  1.005 - 1.030   pH 5.0  5.0 - 8.0   Glucose, UA NEGATIVE  NEGATIVE mg/dL   Hgb urine dipstick NEGATIVE  NEGATIVE   Bilirubin Urine NEGATIVE  NEGATIVE   Ketones, ur NEGATIVE  NEGATIVE mg/dL   Protein, ur 30 (*) NEGATIVE mg/dL   Urobilinogen, UA 0.2  0.0 - 1.0 mg/dL   Nitrite NEGATIVE  NEGATIVE   Leukocytes, UA NEGATIVE  NEGATIVE  URINE MICROSCOPIC-ADD ON     Status: Abnormal   Collection Time    01/05/13  5:57 PM      Result Value Range   Squamous Epithelial / LPF RARE  RARE   WBC, UA 0-2  <3 WBC/hpf   Casts HYALINE CASTS (*) NEGATIVE   Urine-Other MUCOUS PRESENT    GLUCOSE, CAPILLARY     Status: Abnormal   Collection Time    01/05/13 10:57 PM      Result Value Range   Glucose-Capillary 161 (*) 70 - 99 mg/dL  CBC     Status: Abnormal   Collection Time    01/06/13  5:53 AM      Result Value Range   WBC 10.9 (*) 4.0 - 10.5 K/uL   RBC 2.62 (*) 3.87 - 5.11 MIL/uL   Hemoglobin 7.7 (*) 12.0 - 15.0 g/dL   HCT 29.5 (*) 62.1 - 30.8 %   MCV 87.8  78.0 - 100.0 fL   MCH 29.4  26.0 - 34.0 pg   MCHC 33.5  30.0 - 36.0 g/dL   RDW 65.7  84.6 - 96.2 %   Platelets 241  150 - 400 K/uL  RENAL FUNCTION PANEL     Status: Abnormal   Collection Time    01/06/13  5:53 AM      Result Value Range   Sodium 143  135 - 145 mEq/L   Potassium 4.2  3.5 - 5.1 mEq/L   Chloride 108  96  - 112 mEq/L   CO2 25  19 - 32 mEq/L   Glucose, Bld 128 (*) 70 - 99 mg/dL   BUN 23  6 - 23 mg/dL   Creatinine, Ser 9.52 (*) 0.50 - 1.10 mg/dL   Calcium 8.5  8.4 - 84.1 mg/dL   Phosphorus 2.7  2.3 - 4.6 mg/dL   Albumin 2.9 (*) 3.5 - 5.2 g/dL  GFR calc non Af Amer 30 (*) >90 mL/min   GFR calc Af Amer 34 (*) >90 mL/min   Comment:            The eGFR has been calculated     using the CKD EPI equation.     This calculation has not been     validated in all clinical     situations.     eGFR's persistently     <90 mL/min signify     possible Chronic Kidney Disease.  RETICULOCYTES     Status: Abnormal   Collection Time    01/06/13  5:53 AM      Result Value Range   Retic Ct Pct 1.5  0.4 - 3.1 %   RBC. 2.62 (*) 3.87 - 5.11 MIL/uL   Retic Count, Manual 39.3  19.0 - 186.0 K/uL  GLUCOSE, CAPILLARY     Status: Abnormal   Collection Time    01/06/13  7:31 AM      Result Value Range   Glucose-Capillary 122 (*) 70 - 99 mg/dL   Comment 1 Notify RN    GLUCOSE, CAPILLARY     Status: Abnormal   Collection Time    01/06/13 11:55 AM      Result Value Range   Glucose-Capillary 132 (*) 70 - 99 mg/dL   Comment 1 Notify RN    TYPE AND SCREEN     Status: None   Collection Time    01/06/13 12:06 PM      Result Value Range   ABO/RH(D) O POS     Antibody Screen NEG     Sample Expiration 01/09/2013     Unit Number [id number]     Blood Component Type RED CELLS,LR     Unit division 00     Status of Unit ISSUED     Transfusion Status OK TO TRANSFUSE     Crossmatch Result Compatible     Unit Number [id number]     Blood Component Type RED CELLS,LR     Unit division 00     Status of Unit ISSUED     Transfusion Status OK TO TRANSFUSE     Crossmatch Result Compatible    PREPARE RBC (CROSSMATCH)     Status: None   Collection Time    01/06/13 12:06 PM      Result Value Range   Order Confirmation ORDER PROCESSED BY BLOOD BANK    GLUCOSE, CAPILLARY     Status: Abnormal   Collection Time     01/06/13  4:29 PM      Result Value Range   Glucose-Capillary 184 (*) 70 - 99 mg/dL  HEMOGLOBIN AND HEMATOCRIT, BLOOD     Status: Abnormal   Collection Time    01/06/13  9:12 PM      Result Value Range   Hemoglobin 9.0 (*) 12.0 - 15.0 g/dL   HCT 30.8 (*) 65.7 - 84.6 %  GLUCOSE, CAPILLARY     Status: Abnormal   Collection Time    01/06/13  9:56 PM      Result Value Range   Glucose-Capillary 158 (*) 70 - 99 mg/dL  CBC     Status: Abnormal   Collection Time    01/07/13  4:30 AM      Result Value Range   WBC 15.6 (*) 4.0 - 10.5 K/uL   RBC 3.57 (*) 3.87 - 5.11 MIL/uL   Hemoglobin 10.1 (*) 12.0 - 15.0 g/dL   HCT 96.2 (*)  36.0 - 46.0 %   MCV 83.8  78.0 - 100.0 fL   MCH 28.3  26.0 - 34.0 pg   MCHC 33.8  30.0 - 36.0 g/dL   RDW 16.1 (*) 09.6 - 04.5 %   Platelets 258  150 - 400 K/uL  BASIC METABOLIC PANEL     Status: Abnormal   Collection Time    01/07/13  4:30 AM      Result Value Range   Sodium 142  135 - 145 mEq/L   Potassium 3.9  3.5 - 5.1 mEq/L   Chloride 108  96 - 112 mEq/L   CO2 22  19 - 32 mEq/L   Glucose, Bld 184 (*) 70 - 99 mg/dL   BUN 24 (*) 6 - 23 mg/dL   Creatinine, Ser 4.09 (*) 0.50 - 1.10 mg/dL   Calcium 8.9  8.4 - 81.1 mg/dL   GFR calc non Af Amer 31 (*) >90 mL/min   GFR calc Af Amer 36 (*) >90 mL/min   Comment:            The eGFR has been calculated     using the CKD EPI equation.     This calculation has not been     validated in all clinical     situations.     eGFR's persistently     <90 mL/min signify     possible Chronic Kidney Disease.  TROPONIN I     Status: None   Collection Time    01/07/13  4:30 AM      Result Value Range   Troponin I <0.30  <0.30 ng/mL   Comment:            Due to the release kinetics of cTnI,     a negative result within the first hours     of the onset of symptoms does not rule out     myocardial infarction with certainty.     If myocardial infarction is still suspected,     repeat the test at appropriate intervals.   PRO B NATRIURETIC PEPTIDE     Status: Abnormal   Collection Time    01/07/13  4:30 AM      Result Value Range   Pro B Natriuretic peptide (BNP) 3515.0 (*) 0 - 450 pg/mL    Imaging: No results found.  Assessment:  1. Principal Problem: 2.   Non-ST elevation MI (NSTEMI) 3. Active Problems: 4.   OBSTRUCTIVE SLEEP APNEA 5.   COPD 6.   Anemia 7.   Type II or unspecified type diabetes mellitus without mention of complication, uncontrolled 8.   SVT (supraventricular tachycardia) 9.   ARF (acute renal failure) 10.   HTN (hypertension) 11.   HLD (hyperlipidemia) 12.   PVD (peripheral vascular disease) 13.   Loss of weight 14.   Anorexia 15.   Plan:  1. Seems to have responded to blood transfusion. Concerned for developing volume overload at this point. Stop IVF's - re-start daily home lasix. BNP elevated. OOBTC. Troponin is now undetectable, may just manage medically given her frailty versus cardiac catheterization - will d/w Dr Allyson Sabal her primary cardiologist.  Time Spent Directly with Patient:  15 minutes  Length of Stay:  LOS: 5 days   Chrystie Nose, MD, [facility] Attending Cardiologist [facility]  HILTY,Kenneth C 01/07/2013, 7:56 AM

## 2013-01-08 DIAGNOSIS — N19 Unspecified kidney failure: Secondary | ICD-10-CM

## 2013-01-08 LAB — BASIC METABOLIC PANEL
BUN: 26 mg/dL — ABNORMAL HIGH (ref 6–23)
CO2: 24 mEq/L (ref 19–32)
Calcium: 9.1 mg/dL (ref 8.4–10.5)
Chloride: 105 mEq/L (ref 96–112)
Creatinine, Ser: 1.7 mg/dL — ABNORMAL HIGH (ref 0.50–1.10)
GFR calc Af Amer: 32 mL/min — ABNORMAL LOW (ref >90)
GFR calc non Af Amer: 28 mL/min — ABNORMAL LOW (ref >90)
Glucose, Bld: 93 mg/dL (ref 70–99)
Potassium: 3.5 mEq/L (ref 3.5–5.1)
Sodium: 142 mEq/L (ref 135–145)

## 2013-01-08 LAB — CBC
HCT: 28.1 % — ABNORMAL LOW (ref 36.0–46.0)
Hemoglobin: 9.6 g/dL — ABNORMAL LOW (ref 12.0–15.0)
MCH: 28.7 pg (ref 26.0–34.0)
MCHC: 34.2 g/dL (ref 30.0–36.0)
MCV: 84.1 fL (ref 78.0–100.0)
Platelets: 258 10*3/uL (ref 150–400)
RBC: 3.34 MIL/uL — ABNORMAL LOW (ref 3.87–5.11)
RDW: 18.2 % — ABNORMAL HIGH (ref 11.5–15.5)
WBC: 13.6 10*3/uL — ABNORMAL HIGH (ref 4.0–10.5)

## 2013-01-08 LAB — GLUCOSE, CAPILLARY
Glucose-Capillary: 158 mg/dL — ABNORMAL HIGH (ref 70–99)
Glucose-Capillary: 158 mg/dL — ABNORMAL HIGH (ref 70–99)
Glucose-Capillary: 93 mg/dL (ref 70–99)
Glucose-Capillary: 112 mg/dL — ABNORMAL HIGH (ref 70–99)

## 2013-01-08 LAB — PARATHYROID HORMONE, INTACT (NO CA): PTH: 99.7 pg/mL — ABNORMAL HIGH (ref 14.0–72.0)

## 2013-01-08 MED ORDER — METOPROLOL SUCCINATE ER 100 MG PO TB24
100.0000 mg | ORAL_TABLET | Freq: Every day | ORAL | Status: DC
  Administered 2013-01-08 – 2013-01-11 (×4): 100 mg via ORAL
  Filled 2013-01-08 (×4): qty 1

## 2013-01-08 NOTE — Progress Notes (Signed)
VASCULAR LAB PRELIMINARY  PRELIMINARY  PRELIMINARY  PRELIMINARY  Renal artery duplex completed.    Preliminary report:  No evidence of renal artery stenosis bilaterally.  Elevated celiac and proximal SMA velocities.    Raizy Auzenne, RVT 01/08/2013, 10:16 AM

## 2013-01-08 NOTE — Progress Notes (Addendum)
I have seen and evaluated the patient this AM along with Boyce Medici, PA. I agree with her findings, examination as well as impression recommendations.  Very difficult scenario => renal function initially seemed to improve with fluids & blood, at the expense of increased SOB.  Diuresed well o/n & breathing better.  Still gets SOB transferring from bed to chair.  We discussed her in detail in MD meeting, but no consensus was agreed upon re: +/- diagnostic cardiac cath.  At this point, with labile renal function, I am in favor of a non-invasive approach first.  Will put back on PO Lasix, further titrate the BB dose (as her inciting event was SVT).   Will have PT/OT evaluate & ambulate.  ? D/c planning.  I discussed with the pt & daughter the various options.  She clearly has PAD & CAD -- not sure how great a CABG option she would be.  A "minimal contrast" diagnostic cardiac cath cannot be guaranteed & would only serve to risk stratify -- with ? Of LM stenosis, non-invasive testing would potentially be unrevealing / false negative.  The patient & daughter both (without being influenced) mentioned that they would prefer to avoid an invasive approach & she is afraid of having a Stress test --> We will therefore proceed with the non-invasive approach per patient wishes, & I agree with this course of action.  On BB - increasing dose  On CCB for BP & antianginal effect along with Imdur  No ACE-I in light of labile renal Fxn  On statin  She is relatively sedentary & her inciting event was SVT - rates 160-180 bpm that are far higher than she is likely to do on her own.  Perhaps the best option would be medical therapy for now, plan on moving towards d/c once strong enough per PT/OT evaluation.  If she were to have worsening HF or Angina, then we may feel more compelled to consider angiography.  Anemia -- H/H drifting back down, on Iron; + Epo per Nephrology; ? Benefit from IV Iron given mixed anemia  picture that is likely combined iron deficiency (? From GI loss) as well as anemia of chronic disease (including CKD 3-4).  Renal dopplers currently being done, but kidney sizes seem OK.  Hold off on additional IV fluid hydration.   Marykay Lex, M.D., M.S. [facility] [address] [place], [place]  [address]  [phone] Pager # [phone] 01/08/2013 9:49 AM

## 2013-01-08 NOTE — Progress Notes (Signed)
[facility]  Subjective: No complaints. Denies chest pain. She did have increased shortness of breath yesterday, but states that she is improved this morning.   Objective: Vital signs in last 24 hours: Temp:  [97.9 F (36.6 C)-99.7 F (37.6 C)] 99.7 F (37.6 C) (05/05 0330) Resp:  [20] 20 (05/04 1137) BP: (153-166)/(46-78) 153/78 mmHg (05/04 2330) SpO2:  [93 %-99 %] 96 % (05/05 0816) Last BM Date: 01/07/13  Intake/Output from previous day: 05/04 0701 - 05/05 0700 In: 176.8 [P.O.:80; I.V.:96.8] Out: 3050 [Urine:3050] Intake/Output this shift:    Medications Current Facility-Administered Medications  Medication Dose Route Frequency Provider Last Rate Last Dose  . 0.9 %  sodium chloride infusion   Intravenous Continuous Eduard Clos, MD      . acetaminophen (TYLENOL) tablet 650 mg  650 mg Oral Q6H PRN Eduard Clos, MD   650 mg at 01/07/13 1610   Or  . acetaminophen (TYLENOL) suppository 650 mg  650 mg Rectal Q6H PRN Eduard Clos, MD      . albuterol (PROVENTIL) (5 MG/ML) 0.5% nebulizer solution 2.5 mg  2.5 mg Nebulization Q4H PRN Eduard Clos, MD   2.5 mg at 01/07/13 0521  . albuterol (PROVENTIL) (5 MG/ML) 0.5% nebulizer solution 2.5 mg  2.5 mg Nebulization Q4H WA Renford Dills Defrancesco, MD   2.5 mg at 01/08/13 0815  . ALPRAZolam Prudy Feeler) tablet 0.25 mg  0.25 mg Oral BID PRN Eduard Clos, MD   0.25 mg at 01/07/13 0201  . amLODipine (NORVASC) tablet 10 mg  10 mg Oral Daily Marykay Lex, MD   10 mg at 01/07/13 1103  . aspirin EC tablet 325 mg  325 mg Oral Daily Eduard Clos, MD   325 mg at 01/07/13 1103  . atorvastatin (LIPITOR) tablet 40 mg  40 mg Oral QHS Eduard Clos, MD   40 mg at 01/07/13 2122  . clopidogrel (PLAVIX) tablet 75 mg  75 mg Oral QAC breakfast Eduard Clos, MD   75 mg at 01/07/13 0830  . darbepoetin (ARANESP) injection 100 mcg  100 mcg Subcutaneous Q Sat-1800 Irena Cords,  MD   100 mcg at 01/06/13 2211  . docusate sodium (COLACE) capsule 100 mg  100 mg Oral Daily Chrystie Nose, MD   100 mg at 01/07/13 1101  . FLUoxetine (PROZAC) capsule 20 mg  20 mg Oral Daily Eduard Clos, MD   20 mg at 01/07/13 1103  . furosemide (LASIX) tablet 40 mg  40 mg Oral Daily Chrystie Nose, MD   40 mg at 01/07/13 1101  . glimepiride (AMARYL) tablet 2 mg  2 mg Oral Q breakfast Eduard Clos, MD   2 mg at 01/07/13 0831  . heparin injection 5,000 Units  5,000 Units Subcutaneous Q8H Mihai Croitoru, MD   5,000 Units at 01/08/13 0535  . insulin aspart (novoLOG) injection 0-9 Units  0-9 Units Subcutaneous TID WC Eduard Clos, MD   1 Units at 01/07/13 1709  . ipratropium (ATROVENT) nebulizer solution 0.5 mg  0.5 mg Nebulization Q4H WA Renford Dills Defrancesco, MD   0.5 mg at 01/08/13 0816  . isosorbide mononitrate (IMDUR) 24 hr tablet 90 mg  90 mg Oral Daily Eduard Clos, MD   90 mg at 01/07/13 1103  . metoprolol succinate (TOPROL-XL) 24 hr tablet 75 mg  75 mg Oral Daily Chrystie Nose, MD   75 mg at 01/07/13 1101  .  nitroGLYCERIN (NITROSTAT) SL tablet 0.4 mg  0.4 mg Sublingual Q5 min PRN Eduard Clos, MD      . ondansetron [facility]) tablet 4 mg  4 mg Oral Q6H PRN Eduard Clos, MD       Or  . ondansetron [facility]) injection 4 mg  4 mg Intravenous Q6H PRN Eduard Clos, MD      . oxybutynin (DITROPAN-XL) 24 hr tablet 10 mg  10 mg Oral Daily Eduard Clos, MD   10 mg at 01/07/13 1103  . pantoprazole (PROTONIX) EC tablet 40 mg  40 mg Oral Daily Eduard Clos, MD   40 mg at 01/07/13 1101  . polyethylene glycol (MIRALAX / GLYCOLAX) packet 17 g  17 g Oral Daily PRN Chrystie Nose, MD   17 g at 01/07/13 2130  . potassium chloride SA (K-DUR,KLOR-CON) CR tablet 20 mEq  20 mEq Oral Daily Eduard Clos, MD   20 mEq at 01/07/13 1103  . sodium chloride 0.9 % injection 3 mL  3 mL Intravenous Q12H Eduard Clos, MD   3 mL at 01/07/13  2123    PE: General appearance: alert, cooperative and no distress Lungs: bibasilar rales R>L Heart: regular rate and rhythm Extremities: no LEE Pulses: 2+ and symmetric Skin: warm and dry Neurologic: Grossly normal  Lab Results:   Recent Labs  01/06/13 0553 01/06/13 2112 01/07/13 0430 01/08/13 0443  WBC 10.9*  --  15.6* 13.6*  HGB 7.7* 9.0* 10.1* 9.6*  HCT 23.0* 26.4* 29.9* 28.1*  PLT 241  --  258 258   BMET  Recent Labs  01/06/13 0553 01/07/13 0430 01/08/13 0443  NA 143 142 142  K 4.2 3.9 3.5  CL 108 108 105  CO2 25 22 24   GLUCOSE 128* 184* 93  BUN 23 24* 26*  CREATININE 1.61* 1.53* 1.70*  CALCIUM 8.5 8.9 9.1     Assessment/Plan  Principal Problem:   Non-ST elevation MI (NSTEMI) Active Problems:   OBSTRUCTIVE SLEEP APNEA   COPD   Anemia   Type II or unspecified type diabetes mellitus without mention of complication, uncontrolled   SVT (supraventricular tachycardia)   ARF (acute renal failure)   HTN (hypertension)   HLD (hyperlipidemia)   PVD (peripheral vascular disease)   Loss of weight   Anorexia  Plan: Pt continues to deny CP. SOB improved after dose of IV Lasix last PM. SCr today is 1.7 (1.53 yesterday). Her baseline is ~1.1.  Nephrology's recommendation is to hold off on cath until renal function improves. The patient is scheduled to undergo renal doppler studies today to assess for renal artery stenosis. H/H trending back down at 9.6/28.1 (10.1/29.9 yesterday).  Keep on PO iron for now. GI is following. ? Colonoscopy. Also ? Diagnostic LHC. Cardiologist to assess and will determine. Considering the patient is CP free and with increased SCr, may need to consider managing medically. MD to follow.     LOS: 6 days    Takara Sermons M. Delmer Islam 01/08/2013 8:33 AM

## 2013-01-08 NOTE — Progress Notes (Signed)
Placed pt. On CPAP auto titrate (min: 5, max: 15) via FFM (what pt. Wears at home.) Pt. Stated that she was comfortable with current settings. About 10 minutes after placing pt. On CPAP RT received a call from RN stating that pt. Was refusing to wear CPAP. Pt. Was placed back on 3L [place] by RN. CPAP is at pt.'s bedside on standby.

## 2013-01-08 NOTE — Progress Notes (Signed)
S:No CP NO SOB O:BP 157/82  Pulse 83  Temp(Src) 99.1 F (37.3 C) (Oral)  Resp 16  Ht 5\' 1"  (1.549 m)  Wt 78.1 kg (172 lb 2.9 oz)  BMI 32.55 kg/m2  SpO2 96%  Intake/Output Summary (Last 24 hours) at 01/08/13 0857 Last data filed at 01/08/13 0500  Gross per 24 hour  Intake     83 ml  Output   2800 ml  Net  -2717 ml   Weight change:  Gen Awake and alert CVS:RRR  wo MRG Resp:Bilat crackles Abd:+ BS NTND Ext: No edema NEURO:CNI  Poor hearing  Ox3   . albuterol  2.5 mg Nebulization Q4H WA  . amLODipine  10 mg Oral Daily  . aspirin EC  325 mg Oral Daily  . atorvastatin  40 mg Oral QHS  . clopidogrel  75 mg Oral QAC breakfast  . darbepoetin (ARANESP) injection - NON-DIALYSIS  100 mcg Subcutaneous Q Sat-1800  . docusate sodium  100 mg Oral Daily  . FLUoxetine  20 mg Oral Daily  . furosemide  40 mg Oral Daily  . glimepiride  2 mg Oral Q breakfast  . heparin  5,000 Units Subcutaneous Q8H  . insulin aspart  0-9 Units Subcutaneous TID WC  . ipratropium  0.5 mg Nebulization Q4H WA  . isosorbide mononitrate  90 mg Oral Daily  . metoprolol succinate  75 mg Oral Daily  . oxybutynin  10 mg Oral Daily  . pantoprazole  40 mg Oral Daily  . potassium chloride  20 mEq Oral Daily  . sodium chloride  3 mL Intravenous Q12H   No results found. BMET    Component Value Date/Time   NA 142 01/08/2013 0443   K 3.5 01/08/2013 0443   CL 105 01/08/2013 0443   CO2 24 01/08/2013 0443   GLUCOSE 93 01/08/2013 0443   BUN 26* 01/08/2013 0443   CREATININE 1.70* 01/08/2013 0443   CALCIUM 9.1 01/08/2013 0443   GFRNONAA 28* 01/08/2013 0443   GFRAA 32* 01/08/2013 0443   CBC    Component Value Date/Time   WBC 13.6* 01/08/2013 0443   RBC 3.34* 01/08/2013 0443   HGB 9.6* 01/08/2013 0443   HCT 28.1* 01/08/2013 0443   PLT 258 01/08/2013 0443   MCV 84.1 01/08/2013 0443   MCH 28.7 01/08/2013 0443   MCHC 34.2 01/08/2013 0443   RDW 18.2* 01/08/2013 0443   LYMPHSABS 1.1 01/02/2013 2027   MONOABS 0.7 01/02/2013 2027   EOSABS 0.2  01/02/2013 2027   BASOSABS 0.1 01/02/2013 2027     Assessment: 1. AKI vsCKD,  Scr 1.5-19 during this entire hospitalization though was 1.14 11/13.  ? If RAS playing a role 2. NSTEMI 3. DM 4. HTN 5. COPD on chronic O2 Plan: 1.  For renal Art duplex 2.  Daily Scr 3.  Avoid ACE/ARB 4. Avoid Ht cath if at all possible   Megan Mathews

## 2013-01-09 ENCOUNTER — Inpatient Hospital Stay (HOSPITAL_COMMUNITY): Payer: Medicare Other

## 2013-01-09 LAB — GLUCOSE, CAPILLARY: Glucose-Capillary: 108 mg/dL — ABNORMAL HIGH (ref 70–99)

## 2013-01-09 LAB — CBC
HCT: 28.6 % — ABNORMAL LOW (ref 36.0–46.0)
MCHC: 34.3 g/dL (ref 30.0–36.0)
RDW: 18.1 % — ABNORMAL HIGH (ref 11.5–15.5)

## 2013-01-09 LAB — PROTEIN ELECTROPHORESIS, SERUM
M-Spike, %: NOT DETECTED g/dL
Total Protein ELP: 6 g/dL (ref 6.0–8.3)

## 2013-01-09 LAB — UIFE/LIGHT CHAINS/TP QN, 24-HR UR
Alpha 1, Urine: DETECTED — AB
Free Kappa Lt Chains,Ur: 8.22 mg/dL — ABNORMAL HIGH (ref 0.14–2.42)
Gamma Globulin, Urine: DETECTED — AB

## 2013-01-09 LAB — BASIC METABOLIC PANEL
BUN: 32 mg/dL — ABNORMAL HIGH (ref 6–23)
Creatinine, Ser: 1.86 mg/dL — ABNORMAL HIGH (ref 0.50–1.10)
GFR calc Af Amer: 29 mL/min — ABNORMAL LOW (ref >90)
GFR calc non Af Amer: 25 mL/min — ABNORMAL LOW (ref >90)
Potassium: 4.5 mEq/L (ref 3.5–5.1)

## 2013-01-09 MED ORDER — ASPIRIN EC 81 MG PO TBEC
81.0000 mg | DELAYED_RELEASE_TABLET | Freq: Every day | ORAL | Status: DC
  Administered 2013-01-10 – 2013-01-11 (×2): 81 mg via ORAL
  Filled 2013-01-09 (×2): qty 1

## 2013-01-09 MED ORDER — SALINE SPRAY 0.65 % NA SOLN
1.0000 | NASAL | Status: DC | PRN
Start: 2013-01-09 — End: 2013-01-11
  Filled 2013-01-09: qty 44

## 2013-01-09 MED ORDER — IPRATROPIUM BROMIDE 0.02 % IN SOLN
0.5000 mg | Freq: Three times a day (TID) | RESPIRATORY_TRACT | Status: DC
  Administered 2013-01-10 – 2013-01-11 (×5): 0.5 mg via RESPIRATORY_TRACT
  Filled 2013-01-09 (×5): qty 2.5

## 2013-01-09 MED ORDER — GUAIFENESIN ER 600 MG PO TB12
600.0000 mg | ORAL_TABLET | Freq: Two times a day (BID) | ORAL | Status: DC | PRN
  Administered 2013-01-09: 600 mg via ORAL
  Filled 2013-01-09: qty 1

## 2013-01-09 MED ORDER — ALBUTEROL SULFATE (5 MG/ML) 0.5% IN NEBU
2.5000 mg | INHALATION_SOLUTION | Freq: Three times a day (TID) | RESPIRATORY_TRACT | Status: DC
  Administered 2013-01-10 – 2013-01-11 (×5): 2.5 mg via RESPIRATORY_TRACT
  Filled 2013-01-09 (×5): qty 0.5

## 2013-01-09 NOTE — Progress Notes (Signed)
S:No CP NO SOB O:BP 154/58  Pulse 80  Temp(Src) 98.5 F (36.9 C) (Oral)  Resp 19  Ht 5\' 1"  (1.549 m)  Wt 78.1 kg (172 lb 2.9 oz)  BMI 32.55 kg/m2  SpO2 98%  Intake/Output Summary (Last 24 hours) at 01/09/13 0854 Last data filed at 01/09/13 0756  Gross per 24 hour  Intake    240 ml  Output   1150 ml  Net   -910 ml   Weight change:  Gen Awake and alert CVS:RRR  wo MRG Resp:Bilat crackles Abd:+ BS NTND Ext: No edema NEURO:CNI  Poor hearing  Ox3   . albuterol  2.5 mg Nebulization Q4H WA  . amLODipine  10 mg Oral Daily  . aspirin EC  325 mg Oral Daily  . atorvastatin  40 mg Oral QHS  . clopidogrel  75 mg Oral QAC breakfast  . darbepoetin (ARANESP) injection - NON-DIALYSIS  100 mcg Subcutaneous Q Sat-1800  . docusate sodium  100 mg Oral Daily  . FLUoxetine  20 mg Oral Daily  . furosemide  40 mg Oral Daily  . glimepiride  2 mg Oral Q breakfast  . heparin  5,000 Units Subcutaneous Q8H  . insulin aspart  0-9 Units Subcutaneous TID WC  . ipratropium  0.5 mg Nebulization Q4H WA  . isosorbide mononitrate  90 mg Oral Daily  . metoprolol succinate  100 mg Oral Daily  . oxybutynin  10 mg Oral Daily  . pantoprazole  40 mg Oral Daily  . potassium chloride  20 mEq Oral Daily  . sodium chloride  3 mL Intravenous Q12H   No results found. BMET    Component Value Date/Time   NA 145 01/09/2013 0450   K 4.5 01/09/2013 0450   CL 107 01/09/2013 0450   CO2 25 01/09/2013 0450   GLUCOSE 130* 01/09/2013 0450   BUN 32* 01/09/2013 0450   CREATININE 1.86* 01/09/2013 0450   CALCIUM 9.3 01/09/2013 0450   GFRNONAA 25* 01/09/2013 0450   GFRAA 29* 01/09/2013 0450   CBC    Component Value Date/Time   WBC 13.0* 01/09/2013 0450   RBC 3.43* 01/09/2013 0450   HGB 9.8* 01/09/2013 0450   HCT 28.6* 01/09/2013 0450   PLT 278 01/09/2013 0450   MCV 83.4 01/09/2013 0450   MCH 28.6 01/09/2013 0450   MCHC 34.3 01/09/2013 0450   RDW 18.1* 01/09/2013 0450   LYMPHSABS 1.1 01/02/2013 2027   MONOABS 0.7 01/02/2013 2027   EOSABS 0.2  01/02/2013 2027   BASOSABS 0.1 01/02/2013 2027     Assessment: 1. AKI vsCKD,  Scr 1.5-19 during this entire hospitalization though was 1.14 11/13.  No RAS by doppler 2. NSTEMI 3. DM 4. HTN 5. COPD on chronic O2 Plan: 1.  Cont to diurese.  See what UO does today and if needed can increase lasix to BID 2.  Check CXR as the crackles may not all be related to fluid given her underlying lung ds 3. To work with PT today to get idea of her functional level   Josemiguel Gries T

## 2013-01-09 NOTE — Progress Notes (Signed)
Subjective: No complaints, SOB when coming back from [place] Complains of nose bleeds  Objective: Vital signs in last 24 hours: Temp:  [98.4 F (36.9 C)-99.1 F (37.3 C)] 98.5 F (36.9 C) (05/06 0406) Pulse Rate:  [77-94] 80 (05/06 0406) Resp:  [16-81] 19 (05/06 0406) BP: (137-167)/(48-82) 154/58 mmHg (05/06 0406) SpO2:  [92 %-100 %] 97 % (05/06 0406) Weight change:  Last BM Date: 01/07/13 Intake/Output from previous day:  -860 05/05 0701 - 05/06 0700 In: 240 [P.O.:240] Out: 1100 [Urine:1100] Intake/Output this shift:    PE: General:alert and oriented, obviously SOB Heart:S1S2 RRR Lungs: + wheezes, few rales in the bases Abd:+ BS, soft, nontender Ext:no edema    TELE: SR Lab Results:  Recent Labs  01/08/13 0443 01/09/13 0450  WBC 13.6* 13.0*  HGB 9.6* 9.8*  HCT 28.1* 28.6*  PLT 258 278   BMET  Recent Labs  01/08/13 0443 01/09/13 0450  NA 142 145  K 3.5 4.5  CL 105 107  CO2 24 25  GLUCOSE 93 130*  BUN 26* 32*  CREATININE 1.70* 1.86*  CALCIUM 9.1 9.3    Recent Labs  01/07/13 0430  TROPONINI <0.30    Lab Results  Component Value Date   CHOL 144 05/03/2012   HDL 43.30 05/03/2012   LDLCALC 60 03/24/2010   LDLDIRECT 79.7 05/03/2012   TRIG 264.0* 05/03/2012   CHOLHDL 3 05/03/2012   Lab Results  Component Value Date   HGBA1C 7.9* 05/03/2012     Lab Results  Component Value Date   TSH 0.838 01/03/2013      Studies/Results: Echo: Left ventricle: The cavity size was normal. There was mild concentric hypertrophy. Systolic function was normal. The estimated ejection fraction was in the range of 55% to 60%. Wall motion was normal; there were no regional wall motion abnormalities. Doppler parameters are consistent with abnormal left ventricular relaxation (grade 1 diastolic dysfunction). - Aortic valve: Mildly calcified leaflets. Transvalvular velocity was minimally increased. Mild regurgitation. - Mitral valve: Mildly thickened leaflets .  Mild regurgitation. - Right ventricle: The cavity size was mildly dilated. The moderator band was prominent. - Tricuspid valve: Moderate regurgitation. - Pulmonary arteries: PA peak pressure: 46mm Hg (S). - Systemic veins: The IVC was not visualized   Medications: I have reviewed the patient's current medications. Marland Kitchen albuterol  2.5 mg Nebulization Q4H WA  . amLODipine  10 mg Oral Daily  . aspirin EC  325 mg Oral Daily  . atorvastatin  40 mg Oral QHS  . clopidogrel  75 mg Oral QAC breakfast  . darbepoetin (ARANESP) injection - NON-DIALYSIS  100 mcg Subcutaneous Q Sat-1800  . docusate sodium  100 mg Oral Daily  . FLUoxetine  20 mg Oral Daily  . furosemide  40 mg Oral Daily  . glimepiride  2 mg Oral Q breakfast  . heparin  5,000 Units Subcutaneous Q8H  . insulin aspart  0-9 Units Subcutaneous TID WC  . ipratropium  0.5 mg Nebulization Q4H WA  . isosorbide mononitrate  90 mg Oral Daily  . metoprolol succinate  100 mg Oral Daily  . oxybutynin  10 mg Oral Daily  . pantoprazole  40 mg Oral Daily  . potassium chloride  20 mEq Oral Daily  . sodium chloride  3 mL Intravenous Q12H   Assessment/Plan: Principal Problem:   Non-ST elevation MI (NSTEMI) Active Problems:   OBSTRUCTIVE SLEEP APNEA   COPD   Anemia, transfused 2 units PRBC 01/06/2013   Type II or unspecified type  diabetes mellitus without mention of complication, uncontrolled   SVT (supraventricular tachycardia)   ARF (acute renal failure)   HTN (hypertension)   HLD (hyperlipidemia)   PVD (peripheral vascular disease)   Loss of weight   Anorexia  PLAN:  Slight bump in S cr., anemia stable after 2 units, epo and IV iron on 5/3, diabetes controlled.  No further SVT.  Medical therapy for NSTEMI, unless recurrent anginal symptoms.  + DOE, wheezes.   + nose bleeds, add humidity to oxygen, add nasal spray  LOS: 7 days   Time spent with pt. :20 minutes. [facility] R  Nurse Practitioner Certified Pager [phone] 01/09/2013,  7:52 AM

## 2013-01-09 NOTE — Progress Notes (Signed)
Pt has refused CPAP for the night. CPAP machine is set up in PT room ready for use if Pt changes her mind. RT will continue to assist as needed.

## 2013-01-09 NOTE — Progress Notes (Signed)
Pt. Seen and examined. Agree with the NP/PA-C note as written.  Still with some breathlessness, no chest pain. Cardiac enzymes are now negative. No further SVT. Discussion Dr. Herbie Baltimore had with family yesterday indicated family wishes to pursue medical management for NSTEMI.  Nosebleeds overnight, agree with nasal saline, humidification, reduce aspirin to 81 mg daily. She continues to be net negative. Creatinine is up, but may be settling around 1.7-1.8. H/H appears stable after transfusion -getting iron and EPO. BNP still ~3500.  Appreciate PT - will likely need home PT services.  Chrystie Nose, MD, [facility] Attending Cardiologist [facility]

## 2013-01-09 NOTE — Evaluation (Signed)
Physical Therapy Evaluation Patient Details Name: Megan Mathews MRN: [id number] DOB: Sep 06, 1932 Today's Date: 01/09/2013 Time: 1015-1040 PT Time Calculation (min): 25 min  PT Assessment / Plan / Recommendation Clinical Impression  Pt adm with MI.  Needs skilled PT to maximize I and safety so pt can return home alone.  Feel pt will make enough progress to make this transition with HHPT.    PT Assessment  Patient needs continued PT services    Follow Up Recommendations  Supervision - Intermittent;Home health PT    Does the patient have the potential to tolerate intense rehabilitation      Barriers to Discharge Decreased caregiver support      Equipment Recommendations  None recommended by PT    Recommendations for Other Services OT consult   Frequency Min 3X/week    Precautions / Restrictions Precautions Precautions: Fall Restrictions Weight Bearing Restrictions: No   Pertinent Vitals/Pain N/A      Mobility  Bed Mobility Bed Mobility: Supine to Sit;Sitting - Scoot to Edge of Bed Supine to Sit: 6: Modified independent (Device/Increase time);HOB elevated;With rails Sitting - Scoot to Edge of Bed: 6: Modified independent (Device/Increase time) Details for Bed Mobility Assistance: incr time Transfers Transfers: Sit to Stand;Stand to Sit Sit to Stand: 4: Min guard;With upper extremity assist;From bed;From toilet Stand to Sit: 5: Supervision;With upper extremity assist;With armrests;To chair/3-in-1;To toilet Ambulation/Gait Ambulation/Gait Assistance: 4: Min guard;5: Supervision Ambulation Distance (Feet): 200 Feet Assistive device: None;Rollator Ambulation/Gait Assistance Details: Required min guard when amb without assistive device but only supervision when switched to rollator. Gait Pattern: Step-through pattern;Decreased stride length;Trunk flexed Gait velocity: decr    Exercises     PT Diagnosis: Difficulty walking;Generalized weakness  PT Problem List:  Decreased strength;Decreased activity tolerance;Decreased balance;Decreased mobility PT Treatment Interventions: DME instruction;Gait training;Patient/family education;Functional mobility training;Therapeutic activities;Therapeutic exercise;Balance training   PT Goals Acute Rehab PT Goals PT Goal Formulation: With patient Time For Goal Achievement: 01/16/13 Potential to Achieve Goals: Good Pt will go Sit to Stand: with modified independence PT Goal: Sit to Stand - Progress: Goal set today Pt will go Stand to Sit: with modified independence PT Goal: Stand to Sit - Progress: Goal set today Pt will Ambulate: 51 - 150 feet;with modified independence;with least restrictive assistive device PT Goal: Ambulate - Progress: Goal set today  Visit Information  Last PT Received On: 01/09/13 Assistance Needed: +1    Subjective Data  Subjective: Pt states she recovers faster at home. Patient Stated Goal: Return home   Prior Functioning  Home Living Lives With: Alone Available Help at Discharge: Family;Available PRN/intermittently Type of Home: Apartment Home Access: Level entry Home Layout: One level Bathroom Shower/Tub: Engineer, manufacturing systems: Standard (pt states it is just right.) Home Adaptive Equipment: Shower chair with back;Walker - four wheeled Prior Function Level of Independence: Independent with assistive device(s) Vocation: Retired Musician: HOH    Cognition  Cognition Arousal/Alertness: Awake/alert Behavior During Therapy: WFL for tasks assessed/performed Overall Cognitive Status: Within Functional Limits for tasks assessed    Extremity/Trunk Assessment Right Lower Extremity Assessment RLE ROM/Strength/Tone: Deficits RLE ROM/Strength/Tone Deficits: grossly 4/5 Left Lower Extremity Assessment LLE ROM/Strength/Tone: Deficits LLE ROM/Strength/Tone Deficits: grossly 4/5   Balance Balance Balance Assessed: Yes Static Standing Balance Static  Standing - Balance Support: No upper extremity supported Static Standing - Level of Assistance: 6: Modified independent (Device/Increase time)  End of Session PT - End of Session Equipment Utilized During Treatment: Gait belt;Oxygen Activity Tolerance: Patient limited by fatigue Patient left:  in chair;with call bell/phone within reach Nurse Communication: Mobility status  GP     [facility] 01/09/2013, 10:57 AM  [facility] PT [phone]

## 2013-01-09 NOTE — Progress Notes (Signed)
Patient evaluated for community based chronic disease management services with [facility] Care Management Program as a benefit of patient's [employer]. Patient will receive a post discharge transition of care call and will be evaluated for monthly home visits for assessments and disease process education. Spoke with patient at bedside to explain [facility] Care Management services.  Patient indicated that she would like to give the information to her daughter Delta for consideration.  Left contact information and [facility] literature at bedside.  Will attempt to reach daughter for her input but patient does want an initial transition call for COPD management.  Made inpatient Case Manager aware that [facility] Care Management following. Of note, [facility] Care Management services does not replace or interfere with any services that are arranged by inpatient case management or social work.  For additional questions or referrals please contact Anibal Henderson BSN RN [facility] Liaison at [phone].

## 2013-01-09 NOTE — Progress Notes (Signed)
CARDIAC REHAB PHASE I   PRE:  Rate/Rhythm: 77 SR  BP:  Supine: 144/56  Sitting:   Standing:    SaO2: 94 2L  MODE:  Ambulation: 248 ft   POST:  Rate/Rhythm: 88  BP:  Supine: 160/60  Sitting:   Standing:    SaO2: 90 3L 1340-1415 Assisted X 1 used rollator and O2 3L to ambulate. Gait steady with rollator, pt needs reminders to stay closer to it. Pt is DOE and does tire easily. Back to bed after walk with call light in reach. O2 sat after walk on 3L 90%. Dyspnea improved with rest.  Melina Copa RN 01/09/2013 2:15 PM

## 2013-01-09 NOTE — Progress Notes (Signed)
Pt. Refused CPAP at this time.  Pt. Encouraged to call RT if CPAP desired.

## 2013-01-10 DIAGNOSIS — I5043 Acute on chronic combined systolic (congestive) and diastolic (congestive) heart failure: Secondary | ICD-10-CM | POA: Diagnosis not present

## 2013-01-10 LAB — RENAL FUNCTION PANEL
Albumin: 2.8 g/dL — ABNORMAL LOW (ref 3.5–5.2)
Calcium: 9.2 mg/dL (ref 8.4–10.5)
GFR calc Af Amer: 26 mL/min — ABNORMAL LOW (ref >90)
GFR calc non Af Amer: 22 mL/min — ABNORMAL LOW (ref >90)
Phosphorus: 3.9 mg/dL (ref 2.3–4.6)
Sodium: 140 mEq/L (ref 135–145)

## 2013-01-10 LAB — CBC
MCHC: 33.8 g/dL (ref 30.0–36.0)
RDW: 17.9 % — ABNORMAL HIGH (ref 11.5–15.5)

## 2013-01-10 LAB — GLUCOSE, CAPILLARY
Glucose-Capillary: 184 mg/dL — ABNORMAL HIGH (ref 70–99)
Glucose-Capillary: 99 mg/dL (ref 70–99)
Glucose-Capillary: 176 mg/dL — ABNORMAL HIGH (ref 70–99)

## 2013-01-10 MED ORDER — FUROSEMIDE 40 MG PO TABS
40.0000 mg | ORAL_TABLET | Freq: Two times a day (BID) | ORAL | Status: DC
  Administered 2013-01-10 – 2013-01-11 (×3): 40 mg via ORAL
  Filled 2013-01-10 (×5): qty 1

## 2013-01-10 MED ORDER — DILTIAZEM HCL ER COATED BEADS 180 MG PO CP24
180.0000 mg | ORAL_CAPSULE | Freq: Every day | ORAL | Status: DC
  Administered 2013-01-10 – 2013-01-11 (×2): 180 mg via ORAL
  Filled 2013-01-10 (×2): qty 1

## 2013-01-10 NOTE — Progress Notes (Signed)
Subjective:  SOB improving  Objective:  Vital Signs in the last 24 hours: Temp:  [98.4 F (36.9 C)-98.9 F (37.2 C)] 98.9 F (37.2 C) (05/07 0413) Pulse Rate:  [74-83] 81 (05/07 0953) Resp:  [18-19] 18 (05/07 0413) BP: (144-153)/(36-73) 153/73 mmHg (05/07 0953) SpO2:  [90 %-99 %] 91 % (05/07 0727)  Intake/Output from previous day:  Intake/Output Summary (Last 24 hours) at 01/10/13 1016 Last data filed at 01/10/13 0730  Gross per 24 hour  Intake    960 ml  Output   2800 ml  Net  -1840 ml    Physical Exam: General appearance: alert, cooperative, no distress, morbidly obese, pale and on O2 Lungs: basilar rales Heart: regular rate and rhythm and 2/6 systolic murmur LSB, AOv area Extrem:  No edema   Rate: 82  Rhythm: normal sinus rhythm and with runs of PSVT   Lab Results:  Recent Labs  01/09/13 0450 01/10/13 0445  WBC 13.0* 11.5*  HGB 9.8* 9.6*  PLT 278 307    Recent Labs  01/09/13 0450 01/10/13 0445  NA 145 140  K 4.5 4.0  CL 107 103  CO2 25 25  GLUCOSE 130* 100*  BUN 32* 33*  CREATININE 1.86* 2.03*   No results found for this basename: TROPONINI, CK, MB,  in the last 72 hours Hepatic Function Panel  Recent Labs  01/10/13 0445  ALBUMIN 2.8*   No results found for this basename: CHOL,  in the last 72 hours No results found for this basename: INR,  in the last 72 hours  Imaging: Imaging results have been reviewed  Cardiac Studies:  2D Study Conclusions 01/07/13  - Left ventricle: The cavity size was normal. There was mild concentric hypertrophy. Systolic function was normal. The estimated ejection fraction was in the range of 55% to 60%. Wall motion was normal; there were no regional wall motion abnormalities. Doppler parameters are consistent with abnormal left ventricular relaxation (grade 1 diastolic dysfunction). - Aortic valve: Mildly calcified leaflets. Transvalvular velocity was minimally increased. Mild regurgitation. - Mitral valve:  Mildly thickened leaflets . Mild regurgitation. - Right ventricle: The cavity size was mildly dilated. The moderator band was prominent. - Tricuspid valve: Moderate regurgitation. - Pulmonary arteries: PA peak pressure: 46mm Hg (S). - Systemic veins: The IVC was not visualized. Transthoracic echocardiography. M-mode, complete 2D, spectral Doppler, and color Doppler. Height: Height: 154.9cm. Height: 61in. Weight: Weight: 78.1kg. Weight: 171.8lb. Body mass index: BMI: 32.5kg/m^2. Body surface area: BSA: 1.39m^2. Blood pressure: 179/60. Patient status: Inpatient. Location:    Assessment/Plan:   Principal Problem:   Non-ST elevation MI (NSTEMI) in setting of PSVT Active Problems:   Acute on chronic combined systolic and diastolic heart failure   CAD, multiple PCI/stents. Last cath 12/08- stents patent, 40% LM- med Rx   COPD   Anemia, transfused 2 units PRBC 01/06/2013   Diabetes   SVT (supraventricular tachycardia)   ARF (acute renal failure)   OBSTRUCTIVE SLEEP APNEA- BiPap   HTN (hypertension)   HLD (hyperlipidemia)   PVD, hx of AO stent with PTA for ISR 7/10   Loss of weight   Anorexia    PLAN: Renal has adjusted diuretics up. Will change Norvasc to Diltiazem for PSVT and HTN, (she is on Toprol 100mg  daily.)  Corine Shelter PA-C 01/10/2013, 10:16 AM   Agree with note written by Corine Shelter PAC  No CP/SOB. Pt had a short run of SVT at noon today with HR 140s. She was assymptomatic. Exam benign.  Labs rematkable for slight increase in SCr. Agree with med adjustment as outlined. May be ready for D/C home tomorrow. Will need to arrange HHC as OP with SS.   Runell Gess 01/10/2013 4:53 PM

## 2013-01-10 NOTE — Progress Notes (Signed)
S:No CP NO SOB  Eating well O:BP 150/68  Pulse 83  Temp(Src) 98.9 F (37.2 C) (Oral)  Resp 18  Ht 5\' 1"  (1.549 m)  Wt 78.1 kg (172 lb 2.9 oz)  BMI 32.55 kg/m2  SpO2 91%  Intake/Output Summary (Last 24 hours) at 01/10/13 0734 Last data filed at 01/10/13 0412  Gross per 24 hour  Intake    600 ml  Output   2550 ml  Net  -1950 ml   Weight change:  Gen Awake and alert CVS:RRR  wo MRG Resp:Bilat crackles Abd:+ BS NTND Ext: No edema NEURO:CNI  Poor hearing  Ox3   . albuterol  2.5 mg Nebulization TID  . amLODipine  10 mg Oral Daily  . aspirin EC  81 mg Oral Daily  . atorvastatin  40 mg Oral QHS  . clopidogrel  75 mg Oral QAC breakfast  . darbepoetin (ARANESP) injection - NON-DIALYSIS  100 mcg Subcutaneous Q Sat-1800  . docusate sodium  100 mg Oral Daily  . FLUoxetine  20 mg Oral Daily  . furosemide  40 mg Oral Daily  . glimepiride  2 mg Oral Q breakfast  . heparin  5,000 Units Subcutaneous Q8H  . insulin aspart  0-9 Units Subcutaneous TID WC  . ipratropium  0.5 mg Nebulization TID  . isosorbide mononitrate  90 mg Oral Daily  . metoprolol succinate  100 mg Oral Daily  . oxybutynin  10 mg Oral Daily  . pantoprazole  40 mg Oral Daily  . potassium chloride  20 mEq Oral Daily  . sodium chloride  3 mL Intravenous Q12H   Dg Chest 2 View  01/09/2013  *RADIOLOGY REPORT*  Clinical Data: CHF, shortness of breath  CHEST - 2 VIEW  Comparison: 01/03/2013; 04/27/2011  Findings: Grossly unchanged enlarged cardiac silhouette and mediastinal contours.  Atherosclerotic calcifications within the aortic arch.  Pulmonary vasculature is indistinct with cephalization of flow. Worsening bilateral mid and lower lung heterogeneous air space opacities, right greater than left.  Trace bilateral effusions are not excluded.  No definite pneumothorax. Unchanged bones.  Surgical clips overlie the thoracic inlet and lower neck.  IMPRESSION: Overall findings most suggestive of worsening pulmonary edema and  bibasilar opacities, atelectasis versus infiltrate. A follow-up chest radiograph in 4 to 6 weeks after treatment is recommended to ensure resolution.   Original Report Authenticated By: Tacey Ruiz, MD    BMET    Component Value Date/Time   NA 140 01/10/2013 0445   K 4.0 01/10/2013 0445   CL 103 01/10/2013 0445   CO2 25 01/10/2013 0445   GLUCOSE 100* 01/10/2013 0445   BUN 33* 01/10/2013 0445   CREATININE 2.03* 01/10/2013 0445   CALCIUM 9.2 01/10/2013 0445   GFRNONAA 22* 01/10/2013 0445   GFRAA 26* 01/10/2013 0445   CBC    Component Value Date/Time   WBC 11.5* 01/10/2013 0445   RBC 3.38* 01/10/2013 0445   HGB 9.6* 01/10/2013 0445   HCT 28.4* 01/10/2013 0445   PLT 307 01/10/2013 0445   MCV 84.0 01/10/2013 0445   MCH 28.4 01/10/2013 0445   MCHC 33.8 01/10/2013 0445   RDW 17.9* 01/10/2013 0445   LYMPHSABS 1.1 01/02/2013 2027   MONOABS 0.7 01/02/2013 2027   EOSABS 0.2 01/02/2013 2027   BASOSABS 0.1 01/02/2013 2027     Assessment: 1. AKI vsCKD, UO good Scr fractionally higher 2. NSTEMI 3. DM 4. HTN 5. COPD on chronic O2 6.  CHF by CXR Plan: 1.  Increase lasix  to BID 2. Cont to work with PT.  She plans to go home with home aid? 3. Recheck labs in am Aliya Sol T

## 2013-01-10 NOTE — Progress Notes (Signed)
CARDIAC REHAB PHASE I   PRE:  Rate/Rhythm: 77 SR    BP: sitting 150/60    SaO2: 95 2L  MODE:  Ambulation: 270 ft   POST:  Rate/Rhythm: 90    BP: sitting 120/60     SaO2: 94 3L  Tolerated well. Fairly steady with RW, which she has at home. No major dyspnea. BP lower after walk. Gave MI book and diet sheets to begin reading.  [id number]   Harriet Masson CES, ACSM 01/10/2013 9:31 AM

## 2013-01-10 NOTE — Progress Notes (Signed)
Physical Therapy Treatment Patient Details Name: Megan Mathews MRN: [id number] DOB: 02-23-1932 Today's Date: 01/10/2013 Time: 6295-2841 PT Time Calculation (min): 24 min  PT Assessment / Plan / Recommendation Comments on Treatment Session  Patient making steady progress towards PT goals at this time. Patient able to ambulate without device but upon fatigue began to become visibly unsteady. Patient educated in ther-ex to perform throughout the day. Will continue to see and increase activity as tolerated.    Follow Up Recommendations  Supervision - Intermittent;Home health PT     Does the patient have the potential to tolerate intense rehabilitation     Barriers to Discharge        Equipment Recommendations  None recommended by PT    Recommendations for Other Services OT consult  Frequency Min 3X/week   Plan Discharge plan remains appropriate    Precautions / Restrictions Precautions Precautions: Fall Restrictions Weight Bearing Restrictions: No   Pertinent Vitals/Pain No pain; SpO2 3 liters >95% with ambulation and rest breaks    Mobility  Bed Mobility Bed Mobility: Supine to Sit;Sitting - Scoot to Edge of Bed Supine to Sit: 6: Modified independent (Device/Increase time);HOB elevated;With rails Sitting - Scoot to Edge of Bed: 6: Modified independent (Device/Increase time) Details for Bed Mobility Assistance: incr time Transfers Transfers: Sit to Stand;Stand to Sit Sit to Stand: 4: Min guard;With upper extremity assist;From bed;From toilet Stand to Sit: 5: Supervision;With upper extremity assist;With armrests;To chair/3-in-1;To toilet Ambulation/Gait Ambulation/Gait Assistance: 4: Min guard;5: Supervision Ambulation Distance (Feet): 240 Feet Assistive device: None Ambulation/Gait Assistance Details: Patient supervision initially but became min guard with fatigue. SpO2 >95% on 3 liters Gait Pattern: Step-through pattern;Decreased stride length;Trunk flexed Gait velocity:  decr    Exercises General Exercises - Lower Extremity Ankle Circles/Pumps: AROM;Both;10 reps Long Arc Quad: AROM;Both;10 reps Straight Leg Raises: AROM;Both;10 reps      PT Goals Acute Rehab PT Goals PT Goal Formulation: With patient Time For Goal Achievement: 01/16/13 Potential to Achieve Goals: Good Pt will go Sit to Stand: with modified independence PT Goal: Sit to Stand - Progress: Progressing toward goal Pt will go Stand to Sit: with modified independence PT Goal: Stand to Sit - Progress: Progressing toward goal Pt will Ambulate: 51 - 150 feet;with modified independence;with least restrictive assistive device PT Goal: Ambulate - Progress: Progressing toward goal  Visit Information  Last PT Received On: 01/10/13 Assistance Needed: +1    Subjective Data  Subjective: I am hoping to get some rest Patient Stated Goal: Return home   Cognition  Cognition Arousal/Alertness: Awake/alert Behavior During Therapy: WFL for tasks assessed/performed Overall Cognitive Status: Within Functional Limits for tasks assessed    Balance  Balance Balance Assessed: Yes Static Standing Balance Static Standing - Balance Support: No upper extremity supported Static Standing - Level of Assistance: 6: Modified independent (Device/Increase time)  End of Session PT - End of Session Equipment Utilized During Treatment: Gait belt;Oxygen Activity Tolerance: Patient tolerated treatment well Patient left: in bed;with call bell/phone within reach Nurse Communication: Mobility status   GP     Fabio Asa 01/10/2013, 2:40 PM Charlotte Crumb, PT DPT  [phone]

## 2013-01-11 LAB — BASIC METABOLIC PANEL
BUN: 35 mg/dL — ABNORMAL HIGH (ref 6–23)
CO2: 25 mEq/L (ref 19–32)
Calcium: 9.5 mg/dL (ref 8.4–10.5)
Chloride: 105 mEq/L (ref 96–112)
Creatinine, Ser: 1.91 mg/dL — ABNORMAL HIGH (ref 0.50–1.10)
GFR calc Af Amer: 28 mL/min — ABNORMAL LOW (ref >90)
GFR calc non Af Amer: 24 mL/min — ABNORMAL LOW (ref >90)
Glucose, Bld: 119 mg/dL — ABNORMAL HIGH (ref 70–99)
Potassium: 4 mEq/L (ref 3.5–5.1)
Sodium: 141 mEq/L (ref 135–145)

## 2013-01-11 LAB — CBC
HCT: 29.1 % — ABNORMAL LOW (ref 36.0–46.0)
MCV: 85.3 fL (ref 78.0–100.0)
Platelets: 306 10*3/uL (ref 150–400)
RBC: 3.41 MIL/uL — ABNORMAL LOW (ref 3.87–5.11)
WBC: 11 10*3/uL — ABNORMAL HIGH (ref 4.0–10.5)

## 2013-01-11 LAB — GLUCOSE, CAPILLARY
Glucose-Capillary: 116 mg/dL — ABNORMAL HIGH (ref 70–99)
Glucose-Capillary: 145 mg/dL — ABNORMAL HIGH (ref 70–99)

## 2013-01-11 MED ORDER — ISOSORBIDE MONONITRATE ER 30 MG PO TB24: 90.0000 mg | ORAL_TABLET | Freq: Every day | ORAL | Status: DC

## 2013-01-11 MED ORDER — PANTOPRAZOLE SODIUM 40 MG PO TBEC: 40.0000 mg | DELAYED_RELEASE_TABLET | Freq: Every day | ORAL | Status: DC

## 2013-01-11 MED ORDER — FUROSEMIDE 40 MG PO TABS: 40.0000 mg | ORAL_TABLET | Freq: Two times a day (BID) | ORAL | Status: DC

## 2013-01-11 MED ORDER — ASPIRIN 81 MG PO TBEC: 81.0000 mg | DELAYED_RELEASE_TABLET | Freq: Every day | ORAL | Status: AC

## 2013-01-11 MED ORDER — DILTIAZEM HCL ER COATED BEADS 180 MG PO CP24: 180.0000 mg | ORAL_CAPSULE | Freq: Every day | ORAL | Status: DC

## 2013-01-11 NOTE — Progress Notes (Signed)
Physical Therapy Treatment Patient Details Name: Megan Mathews MRN: [id number] DOB: March 25, 1932 Today's Date: 01/11/2013 Time: 4098-1191 PT Time Calculation (min): 26 min  PT Assessment / Plan / Recommendation Comments on Treatment Session  Pt making steady progress towards PT goals. Spoke with patient and daughter at length regarding mobility and energy conservation. Recommended rest breaks and spoke with daughter about possible purchase of a pulse oximeter to assist with visual feedback to guage activity tolerance. Will continue to see and increase activity with pt as tolerated.    Follow Up Recommendations  Supervision - Intermittent;Home health PT           Equipment Recommendations  None recommended by PT       Frequency Min 3X/week   Plan Discharge plan remains appropriate    Precautions / Restrictions Precautions Precautions: Fall Restrictions Weight Bearing Restrictions: No   Pertinent Vitals/Pain No pain reported at this time.    Mobility  Bed Mobility Bed Mobility: Supine to Sit;Sitting - Scoot to Edge of Bed Supine to Sit: 6: Modified independent (Device/Increase time);HOB elevated;With rails Sitting - Scoot to Edge of Bed: 6: Modified independent (Device/Increase time) Details for Bed Mobility Assistance: incr time Transfers Transfers: Sit to Stand;Stand to Sit Sit to Stand: 5: Supervision;With upper extremity assist;From bed;From toilet Stand to Sit: 5: Supervision;With upper extremity assist;With armrests;To chair/3-in-1;To toilet Ambulation/Gait Ambulation/Gait Assistance: 5: Supervision Ambulation Distance (Feet): 300 Feet Assistive device: None Ambulation/Gait Assistance Details: Initally no device but with fatigue became HHA, rec use of rw at home for distances greater than 51ft Gait Pattern: Step-through pattern;Decreased stride length;Trunk flexed Gait velocity: decr Stairs: Yes Stairs Assistance: 4: Min assist Stairs Assistance Details (indicate  cue type and reason): Assist for trunk support and stability Stair Management Technique: One rail Right;Step to pattern;Sideways Number of Stairs: 4    Exercises General Exercises - Lower Extremity Ankle Circles/Pumps: AROM;Both;10 reps     PT Goals Acute Rehab PT Goals PT Goal Formulation: With patient Time For Goal Achievement: 01/16/13 Potential to Achieve Goals: Good Pt will go Sit to Stand: with modified independence PT Goal: Sit to Stand - Progress: Progressing toward goal Pt will go Stand to Sit: with modified independence PT Goal: Stand to Sit - Progress: Progressing toward goal Pt will Ambulate: 51 - 150 feet;with modified independence;with least restrictive assistive device PT Goal: Ambulate - Progress: Progressing toward goal  Visit Information  Last PT Received On: 01/11/13 Assistance Needed: +1    Subjective Data  Subjective: I am hoping to get some rest Patient Stated Goal: Return home   Cognition  Cognition Arousal/Alertness: Awake/alert Behavior During Therapy: WFL for tasks assessed/performed Overall Cognitive Status: Within Functional Limits for tasks assessed    Balance  Balance Balance Assessed: Yes Static Standing Balance Static Standing - Balance Support: No upper extremity supported Static Standing - Level of Assistance: 6: Modified independent (Device/Increase time)  End of Session PT - End of Session Equipment Utilized During Treatment: Gait belt;Oxygen Activity Tolerance: Patient tolerated treatment well Patient left: in bed;with call bell/phone within reach;with family/visitor present Nurse Communication: Mobility status   GP     Fabio Asa 01/11/2013, 4:28 PM Charlotte Crumb, PT DPT  [phone]

## 2013-01-11 NOTE — Progress Notes (Signed)
[facility]  Subjective: Breathing better. No complaints.   Objective: Vital signs in last 24 hours: Temp:  [97.8 F (36.6 C)-98.6 F (37 C)] 97.8 F (36.6 C) (05/08 0405) Pulse Rate:  [62-81] 62 (05/08 0405) Resp:  [18-19] 18 (05/08 0405) BP: (126-158)/(44-83) 147/83 mmHg (05/08 0405) SpO2:  [96 %-99 %] 99 % (05/08 0405) Weight:  [174 lb 13.2 oz (79.3 kg)] 174 lb 13.2 oz (79.3 kg) (05/08 0405) Last BM Date: 01/08/13  Intake/Output from previous day: 05/07 0701 - 05/08 0700 In: 600 [P.O.:600] Out: 3600 [Urine:3600] Intake/Output this shift: Total I/O In: -  Out: 300 [Urine:300]  Medications Current Facility-Administered Medications  Medication Dose Route Frequency Provider Last Rate Last Dose  . 0.9 %  sodium chloride infusion   Intravenous Continuous Eduard Clos, MD      . acetaminophen (TYLENOL) tablet 650 mg  650 mg Oral Q6H PRN Eduard Clos, MD   650 mg at 01/07/13 1610   Or  . acetaminophen (TYLENOL) suppository 650 mg  650 mg Rectal Q6H PRN Eduard Clos, MD      . albuterol (PROVENTIL) (5 MG/ML) 0.5% nebulizer solution 2.5 mg  2.5 mg Nebulization Q4H PRN Eduard Clos, MD   2.5 mg at 01/07/13 0521  . albuterol (PROVENTIL) (5 MG/ML) 0.5% nebulizer solution 2.5 mg  2.5 mg Nebulization TID Marykay Lex, MD   2.5 mg at 01/10/13 2017  . ALPRAZolam Prudy Feeler) tablet 0.25 mg  0.25 mg Oral BID PRN Eduard Clos, MD   0.25 mg at 01/07/13 0201  . aspirin EC tablet 81 mg  81 mg Oral Daily Chrystie Nose, MD   81 mg at 01/10/13 0953  . atorvastatin (LIPITOR) tablet 40 mg  40 mg Oral QHS Eduard Clos, MD   40 mg at 01/10/13 2131  . clopidogrel (PLAVIX) tablet 75 mg  75 mg Oral QAC breakfast Eduard Clos, MD   75 mg at 01/11/13 0829  . darbepoetin (ARANESP) injection 100 mcg  100 mcg Subcutaneous Q Sat-1800 Irena Cords, MD   100 mcg at 01/06/13 2211  . diltiazem (CARDIZEM CD) 24 hr capsule 180  mg  180 mg Oral Daily Abelino Derrick, PA-C   180 mg at 01/10/13 1219  . docusate sodium (COLACE) capsule 100 mg  100 mg Oral Daily Chrystie Nose, MD   100 mg at 01/10/13 0953  . FLUoxetine (PROZAC) capsule 20 mg  20 mg Oral Daily Eduard Clos, MD   20 mg at 01/10/13 0953  . furosemide (LASIX) tablet 40 mg  40 mg Oral BID Dyke Maes, MD   40 mg at 01/11/13 0829  . glimepiride (AMARYL) tablet 2 mg  2 mg Oral Q breakfast Eduard Clos, MD   2 mg at 01/11/13 0829  . guaiFENesin (MUCINEX) 12 hr tablet 600 mg  600 mg Oral BID PRN Meridee Score, MD   600 mg at 01/09/13 2146  . heparin injection 5,000 Units  5,000 Units Subcutaneous Q8H Mihai Croitoru, MD   5,000 Units at 01/11/13 0601  . insulin aspart (novoLOG) injection 0-9 Units  0-9 Units Subcutaneous TID WC Eduard Clos, MD   2 Units at 01/10/13 1643  . ipratropium (ATROVENT) nebulizer solution 0.5 mg  0.5 mg Nebulization TID Marykay Lex, MD   0.5 mg at 01/10/13 2017  . isosorbide mononitrate (IMDUR) 24 hr tablet 90 mg  90 mg Oral Daily Arshad N  Toniann Fail, MD   90 mg at 01/10/13 0953  . metoprolol succinate (TOPROL-XL) 24 hr tablet 100 mg  100 mg Oral Daily Marykay Lex, MD   100 mg at 01/10/13 1610  . nitroGLYCERIN (NITROSTAT) SL tablet 0.4 mg  0.4 mg Sublingual Q5 min PRN Eduard Clos, MD      . ondansetron [facility]) tablet 4 mg  4 mg Oral Q6H PRN Eduard Clos, MD       Or  . ondansetron [facility]) injection 4 mg  4 mg Intravenous Q6H PRN Eduard Clos, MD      . oxybutynin (DITROPAN-XL) 24 hr tablet 10 mg  10 mg Oral Daily Eduard Clos, MD   10 mg at 01/10/13 0953  . pantoprazole (PROTONIX) EC tablet 40 mg  40 mg Oral Daily Eduard Clos, MD   40 mg at 01/10/13 1219  . polyethylene glycol (MIRALAX / GLYCOLAX) packet 17 g  17 g Oral Daily PRN Chrystie Nose, MD   17 g at 01/10/13 2028  . potassium chloride SA (K-DUR,KLOR-CON) CR tablet 20 mEq  20 mEq Oral Daily Eduard Clos, MD   20 mEq at 01/10/13 0953  . sodium chloride (OCEAN) 0.65 % nasal spray 1 spray  1 spray Each Nare PRN Nada Boozer, NP      . sodium chloride 0.9 % injection 3 mL  3 mL Intravenous Q12H Eduard Clos, MD   3 mL at 01/10/13 2131    PE: General appearance: alert, cooperative and no distress Lungs: clear to auscultation bilaterally Heart: regular rate and rhythm Extremities: no LEE Pulses: 2+ and symmetric Skin: warm and dry Neurologic: Grossly normal  Lab Results:   Recent Labs  01/09/13 0450 01/10/13 0445 01/11/13 0418  WBC 13.0* 11.5* 11.0*  HGB 9.8* 9.6* 9.7*  HCT 28.6* 28.4* 29.1*  PLT 278 307 306   BMET  Recent Labs  01/09/13 0450 01/10/13 0445 01/11/13 0418  NA 145 140 141  K 4.5 4.0 4.0  CL 107 103 105  CO2 25 25 25   GLUCOSE 130* 100* 119*  BUN 32* 33* 35*  CREATININE 1.86* 2.03* 1.91*  CALCIUM 9.3 9.2 9.5   BNP (last 3 results)  Recent Labs  01/07/13 0430  PROBNP 3515.0*   Assessment/Plan  Principal Problem:   Non-ST elevation MI (NSTEMI) in setting of PSVT Active Problems:   OBSTRUCTIVE SLEEP APNEA- BiPap   CAD, multiple PCI/stents. Last cath 12/08- stents patent, 40% LM- med Rx   COPD   Anemia, transfused 2 units PRBC 01/06/2013, (heme negative)   Diabetes   SVT (supraventricular tachycardia)   ARF (acute renal failure)   HTN (hypertension)   HLD (hyperlipidemia)   PVD, hx of AO stent with PTA for ISR 7/10   Loss of weight   Anorexia   Acute on chronic combined systolic and diastolic heart failure  Plan: NSR on telemetry w/ HR of 65. No further SVT. CCB was changed from Norvasc to Diltiazem yesterday. Pt is also on Toprol daily. BP is stable. SCr is improved at 1.91 today (2.03 yesterday). Per renal's recommendations, continue with BID dosing of Lasix. ? Possible discharge later today. Physical therapy recommends home health PT.     LOS: 9 days    Brittainy M. Delmer Islam 01/11/2013 9:36 AM   Patient seen and  examined. Agree with assessment and plan. No chest pain. No dyspnea. Coughing.  Maintaining NSR. Tolerating diltiazem. Lungs are clear, no edema. Renal function  improving. Probably ok to dc later today on bid lasix per renal.   Lennette Bihari, MD, [facility] 01/11/2013 1:43 PM

## 2013-01-11 NOTE — Progress Notes (Signed)
S:No CP NO SOB   O:BP 147/83  Pulse 62  Temp(Src) 97.8 F (36.6 C) (Oral)  Resp 18  Ht 5\' 1"  (1.549 m)  Wt 79.3 kg (174 lb 13.2 oz)  BMI 33.05 kg/m2  SpO2 99%  Intake/Output Summary (Last 24 hours) at 01/11/13 0720 Last data filed at 01/11/13 0300  Gross per 24 hour  Intake    600 ml  Output   3600 ml  Net  -3000 ml   Weight change:  Gen Awake and alert CVS:RRR  wo MRG Resp:fewer Bilat crackles Abd:+ BS NTND Ext: No edema NEURO:CNI  Poor hearing  Ox3   . albuterol  2.5 mg Nebulization TID  . aspirin EC  81 mg Oral Daily  . atorvastatin  40 mg Oral QHS  . clopidogrel  75 mg Oral QAC breakfast  . darbepoetin (ARANESP) injection - NON-DIALYSIS  100 mcg Subcutaneous Q Sat-1800  . diltiazem  180 mg Oral Daily  . docusate sodium  100 mg Oral Daily  . FLUoxetine  20 mg Oral Daily  . furosemide  40 mg Oral BID  . glimepiride  2 mg Oral Q breakfast  . heparin  5,000 Units Subcutaneous Q8H  . insulin aspart  0-9 Units Subcutaneous TID WC  . ipratropium  0.5 mg Nebulization TID  . isosorbide mononitrate  90 mg Oral Daily  . metoprolol succinate  100 mg Oral Daily  . oxybutynin  10 mg Oral Daily  . pantoprazole  40 mg Oral Daily  . potassium chloride  20 mEq Oral Daily  . sodium chloride  3 mL Intravenous Q12H   Dg Chest 2 View  01/09/2013  *RADIOLOGY REPORT*  Clinical Data: CHF, shortness of breath  CHEST - 2 VIEW  Comparison: 01/03/2013; 04/27/2011  Findings: Grossly unchanged enlarged cardiac silhouette and mediastinal contours.  Atherosclerotic calcifications within the aortic arch.  Pulmonary vasculature is indistinct with cephalization of flow. Worsening bilateral mid and lower lung heterogeneous air space opacities, right greater than left.  Trace bilateral effusions are not excluded.  No definite pneumothorax. Unchanged bones.  Surgical clips overlie the thoracic inlet and lower neck.  IMPRESSION: Overall findings most suggestive of worsening pulmonary edema and bibasilar  opacities, atelectasis versus infiltrate. A follow-up chest radiograph in 4 to 6 weeks after treatment is recommended to ensure resolution.   Original Report Authenticated By: Tacey Ruiz, MD    BMET    Component Value Date/Time   NA 141 01/11/2013 0418   K 4.0 01/11/2013 0418   CL 105 01/11/2013 0418   CO2 25 01/11/2013 0418   GLUCOSE 119* 01/11/2013 0418   BUN 35* 01/11/2013 0418   CREATININE 1.91* 01/11/2013 0418   CALCIUM 9.5 01/11/2013 0418   GFRNONAA 24* 01/11/2013 0418   GFRAA 28* 01/11/2013 0418   CBC    Component Value Date/Time   WBC 11.0* 01/11/2013 0418   RBC 3.41* 01/11/2013 0418   HGB 9.7* 01/11/2013 0418   HCT 29.1* 01/11/2013 0418   PLT 306 01/11/2013 0418   MCV 85.3 01/11/2013 0418   MCH 28.4 01/11/2013 0418   MCHC 33.3 01/11/2013 0418   RDW 17.9* 01/11/2013 0418   LYMPHSABS 1.1 01/02/2013 2027   MONOABS 0.7 01/02/2013 2027   EOSABS 0.2 01/02/2013 2027   BASOSABS 0.1 01/02/2013 2027     Assessment: 1. AKI vsCKD, UO good Scr stable 2. NSTEMI 3. DM 4. HTN 5. COPD on chronic O2 6.  CHF by CXR, diuresing well Plan: 1. Cont lasix  BID 2.  She can go from my standpoint.  I am adding little at this time, will sign off.  Please call if further renal issues. Ch[place]wuem[place]a Artola T

## 2013-01-11 NOTE — Discharge Summary (Signed)
Physician Discharge Summary  Patient ID: Megan Mathews MRN: [id number] DOB/AGE: 1932/07/13 79 y.o.  Admit date: 01/02/2013 Discharge date: 01/11/2013  Admission Diagnoses: NSTEMI in setting of PVST  Discharge Diagnoses:  Principal Problem:   Non-ST elevation MI (NSTEMI) in setting of PSVT Active Problems:   OBSTRUCTIVE SLEEP APNEA- BiPap   CAD, multiple PCI/stents. Last cath 12/08- stents patent, 40% LM- med Rx   COPD   Anemia, transfused 2 units PRBC 01/06/2013, (heme negative)   Diabetes   SVT (supraventricular tachycardia)   ARF (acute renal failure)   HTN (hypertension)   HLD (hyperlipidemia)   PVD, hx of AO stent with PTA for ISR 7/10   Loss of weight   Anorexia   Acute on chronic combined systolic and diastolic heart failure   Discharged Condition: stable  Hospital Course:   The patient is a 79 y/o female, followed at [facility] by Dr. Allyson Sabal - last seen in 11/2012. She has a history of CAD and PVOD. She underwent a heart cath in Nov 2008 that demonstrated 40% tapered distal left main stenosis but otherwise noncritical CAD. She did have patent stents to her LAD, circumflex and mid and distal RCA with normal LV function.  She also has known renal artery stenosis, R>L, and distal abdominal aorta and bilateral iliac stenting which were patent at the time of her catheterization. She underwent bilateral Iliac artery reintervention March 30, 2009. Her other problems include hypertension, hyperlipidemia, insulin-dependent diabetes and GERD. She reported to the [facility] ER on 01/02/13 with complaints of palpitations and chest pressure, concerning for angina. Work-up revealed that she was in SVT and easily converted with adenosine. Her troponin was elevated and peaked at 0.89. This was felt to be due to supply vs. demand ischemic myocardial necrosis (Type 2  MI) & not due to ACS related plaque rupture (Type 1 MI). This was felt to be essentially a "failed stress test" & precluded the need for  Myoview/Cardiolite ST. A diagnostic LHC was initially considered, however was put on hold due to worsening renal function and anemia. Her SCr peaked to 2.03 during admission. Her Hgb was as low as 7.5. Stool guaiac study was negative. Iron studies suggested mixed iron deficiency and ACD. Both nephrology and gastroenterology were consulted. Nephrology advised against a diagnostic cath due to poor renal function. They felt that her anemia could have been secondary to CKD, with GFR in the 20's-30s. GI work up was minimal, considering the patient had a normal colonoscopy in 2008, an EGD in 2012 that showed only minor distal gastritis and hem negative stools. They also agreed with nephrology that her anemia was likely related to CKD and recommended continuation of PPI therapy. The patient's anemia was corrected with blood transfusions, iron and EPO. Her renal function improved with hydration with IVFs. Once stable, a left heart cath was reconsidered, however after a lengthy discussion between Dr. Herbie Baltimore and the patient and her daughter, the patient decided that she did not want to undergo an invasive work-up. Dr. Herbie Baltimore, felt that her decision was reasonable, as did Dr. Allyson Sabal. It was decided to treat medically. The patient's chest pain resolved. Her SCr stabilized ~1.9.  Her H/H improved and stabilized to 11.4/34.7.  On hospital day 9, she was last seen and examined by Dr. Tresa Endo, who felt that she was stable for discharge home. The patient is scheduled to follow up with Corine Shelter, PA-C on 01/19/13.   Consults: GI and nephrology  Significant Diagnostic Studies: None  Treatments:  See Hospital Course  Discharge Exam: Blood pressure 141/58, pulse 63, temperature 98.4 F (36.9 C), temperature source Oral, resp. rate 18, height 5\' 1"  (1.549 m), weight 174 lb 13.2 oz (79.3 kg), SpO2 98.00%.   Disposition: 01-Home or Self Care  Discharge Orders   Future Appointments Provider Department Dept Phone   01/16/2013  9:00 AM Coralyn Helling, MD [facility] [phone]   01/19/2013 11:00 AM Abelino Derrick, PA-C [facility] [phone]   Future Orders Complete By Expires     Diet - low sodium heart healthy  As directed     Home Health  As directed     Questions:      To provide the following care/treatments:  Home Health Aide    Increase activity slowly  As directed         Medication List    STOP taking these medications       amLODipine 5 MG tablet  Commonly known as:  NORVASC     aspirin 325 MG tablet     omeprazole 20 MG capsule  Commonly known as:  PRILOSEC  Replaced by:  pantoprazole 40 MG tablet      TAKE these medications       acetaminophen 500 MG tablet  Commonly known as:  TYLENOL  Take 1,000 mg by mouth every 6 (six) hours as needed for pain.     albuterol 108 (90 BASE) MCG/ACT inhaler  Commonly known as:  PROAIR HFA  Inhale 2 puffs into the lungs every 4 (four) hours as needed (Cough, wheeze, or chest congestion). 2 puffs up to 4 times a day as needed     albuterol (2.5 MG/3ML) 0.083% nebulizer solution  Commonly known as:  PROVENTIL  Take 2.5 mg by nebulization every 6 (six) hours as needed. For shortness of breath     ALPRAZolam 0.25 MG tablet  Commonly known as:  XANAX  Take 0.25 mg by mouth 2 (two) times daily as needed for anxiety.     aspirin 81 MG EC tablet  Take 1 tablet (81 mg total) by mouth daily.     CALCIUM 600+D 600-400 MG-UNIT per tablet  Generic drug:  Calcium Carbonate-Vitamin D  Take 1 tablet by mouth daily.     clopidogrel 75 MG tablet  Commonly known as:  PLAVIX  Take 1 tablet (75 mg total) by mouth daily.     diltiazem 180 MG 24 hr capsule  Commonly known as:  CARDIZEM CD  Take 1 capsule (180 mg total) by mouth daily.     FLUoxetine 20 MG capsule  Commonly known as:  PROZAC  Take 1 capsule (20 mg total) by mouth daily.     furosemide 40 MG tablet  Commonly known as:  LASIX  Take 1 tablet  (40 mg total) by mouth 2 (two) times daily.     glimepiride 2 MG tablet  Commonly known as:  AMARYL  Take 1 tablet (2 mg total) by mouth daily.     isosorbide mononitrate 30 MG 24 hr tablet  Commonly known as:  IMDUR  Take 3 tablets (90 mg total) by mouth daily.     metFORMIN 1000 MG tablet  Commonly known as:  GLUCOPHAGE  Take 1 tablet (1,000 mg total) by mouth 2 (two) times daily with a meal.     metoprolol succinate 50 MG 24 hr tablet  Commonly known as:  TOPROL-XL  Take 1 tablet (50 mg total) by mouth daily.  Take with or immediately following a meal.     MUCUS RELIEF 400 MG Tabs  Generic drug:  guaifenesin  Take 400 mg by mouth 2 (two) times daily as needed. For congestion     MULTIVITAL PO  Take 1 tablet by mouth daily.     nitroGLYCERIN 0.4 MG/SPRAY spray  Commonly known as:  NITROLINGUAL  Place 1 spray under the tongue every 5 (five) minutes as needed for chest pain.     oxybutynin 10 MG 24 hr tablet  Commonly known as:  DITROPAN-XL  Take 1 tablet (10 mg total) by mouth daily.     oxymetazoline 0.05 % nasal spray  Commonly known as:  AFRIN NASAL SPRAY  1 spray into right nostril if bleeding won't stop.  May repeat once.     pantoprazole 40 MG tablet  Commonly known as:  PROTONIX  Take 1 tablet (40 mg total) by mouth daily.     potassium chloride 10 MEQ CR capsule  Commonly known as:  MICRO-K  Take 20 mEq by mouth daily.     simvastatin 80 MG tablet  Commonly known as:  ZOCOR  Take 1 tablet (80 mg total) by mouth at bedtime.           Follow-up Information   Follow up with Illene Regulus, MD In 2 days.   Contact information:   [address] [place] [address] [phone]       Follow up with Abelino Derrick, PA-C On 01/19/2013. (11:00 am)    Contact information:   [address] [place] [address] [phone]      TIME SPENT ON DISCHARGE, INCLUDING PHYSICIAN TIME: >30 MINUTES  Signed: Allayne Butcher,  PA-C 01/11/2013, 3:38 PM

## 2013-01-11 NOTE — Progress Notes (Signed)
[id number] Cardiac Rehab Completed MI education with pt. She voices understanding. Pt states that she is unable to walk much due to leg pain, and is unable to go outside due to her breathing problems. Encouraged pt to be as active as possible and to walk in the house. She declines Outpt. CRP due to leg pain. Melina Copa RN

## 2013-01-11 NOTE — Progress Notes (Signed)
RT note: Pt refusing CPAP for tonight. I told her to call if she changes her mind. RT will continue to monitor

## 2013-01-15 ENCOUNTER — Other Ambulatory Visit (INDEPENDENT_AMBULATORY_CARE_PROVIDER_SITE_OTHER): Payer: Medicare Other

## 2013-01-15 ENCOUNTER — Encounter: Payer: Self-pay | Admitting: Internal Medicine

## 2013-01-15 ENCOUNTER — Ambulatory Visit (INDEPENDENT_AMBULATORY_CARE_PROVIDER_SITE_OTHER): Payer: Medicare Other | Admitting: Internal Medicine

## 2013-01-15 VITALS — BP 130/62 | HR 50 | Temp 98.1°F | Wt 169.8 lb

## 2013-01-15 DIAGNOSIS — D649 Anemia, unspecified: Secondary | ICD-10-CM

## 2013-01-15 DIAGNOSIS — J449 Chronic obstructive pulmonary disease, unspecified: Secondary | ICD-10-CM

## 2013-01-15 DIAGNOSIS — I219 Acute myocardial infarction, unspecified: Secondary | ICD-10-CM

## 2013-01-15 DIAGNOSIS — R627 Adult failure to thrive: Secondary | ICD-10-CM

## 2013-01-15 DIAGNOSIS — I214 Non-ST elevation (NSTEMI) myocardial infarction: Secondary | ICD-10-CM

## 2013-01-15 LAB — CBC
HCT: 34.7 % — ABNORMAL LOW (ref 36.0–46.0)
MCHC: 33 g/dL (ref 30.0–36.0)
MCV: 87.8 fl (ref 78.0–100.0)
RBC: 3.96 Mil/uL (ref 3.87–5.11)
RDW: 18 % — ABNORMAL HIGH (ref 11.5–14.6)

## 2013-01-15 MED ORDER — ALPRAZOLAM 0.25 MG PO TABS: 0.2500 mg | ORAL_TABLET | Freq: Two times a day (BID) | ORAL | Status: DC | PRN

## 2013-01-15 NOTE — Addendum Note (Signed)
Addended by: Rene Paci A on: 01/15/2013 09:44 AM   Modules accepted: Orders

## 2013-01-15 NOTE — Assessment & Plan Note (Signed)
Transfused during hosp 01/05/13, 2 U - heme neg stool Recheck cbc now Iron/TIBC/Ferritin    Component Value Date/Time   IRON 68 01/05/2013 0927   TIBC 380 01/05/2013 0927   FERRITIN 12 01/05/2013 0927

## 2013-01-15 NOTE — Assessment & Plan Note (Signed)
Admit [facility] 01/02/13 for same - +Trop but no ECG changes No cath pursued - s/p cards eval Conversion of PSVT by adenosine via EMS en route to ER, no symptomatic recurrence per pt follow up cards as planned The current medical regimen is effective;  continue present plan and medications.

## 2013-01-15 NOTE — Progress Notes (Signed)
Subjective:    Patient ID: Megan Mathews, female    DOB: Jan 04, 1932, 79 y.o.   MRN: [id number]  HPI  Patient of Dr. Debby Bud, here for hospital followup Discharge summary and interval events reviewed  Admit date: 01/02/2013 Discharge date: 01/11/2013  Admission Diagnoses:  Discharge Diagnoses:   Principal Problem:   Non-ST elevation MI (NSTEMI) in setting of PSVT Active Problems:   OBSTRUCTIVE SLEEP APNEA- BiPap   CAD, multiple PCI/stents. Last cath 12/08- stents patent, 40% LM- med Rx   COPD   Anemia, transfused 2 units PRBC 01/06/2013, (heme negative)   Diabetes   SVT (supraventricular tachycardia)   ARF (acute renal failure)   HTN (hypertension)   HLD (hyperlipidemia)   PVD, hx of AO stent with PTA for ISR 7/10   Loss of weight   Anorexia   Acute on chronic combined systolic and diastolic heart failure   Since home, no recurrence of chest pain or tightness Has questions regarding medications Also reports increase in chronic balance problems and fatigue, ?order for aide to assist with chores such as laundry and vacuuming  Past Medical History  Diagnosis Date  . COPD (chronic obstructive pulmonary disease)     home 02  . OSA (obstructive sleep apnea)   . Hypoxemia   . Secondary pulmonary hypertension 8/09    "severe" by echo  . CAD (coronary artery disease) '95,'96,'98.'99,'08    Multiple PCI/stenting all 3 vessels  . Diabetes mellitus type II   . Hyperlipidemia   . Hypertension   . Peripheral vascular disease '96, July 2010    AoIliac stenting with PTA for ISR 7/10  . Tobacco abuse     quit '99  . Constipation   . Anxiety   . Depression   . Artery stenosis     renal   bilateral  . Gastritis 04/2011    mild gastritis on EGD  . NSTEMI (non-ST elevated myocardial infarction) 01/02/13    in setting of PSVT, +Trop, no ECG change, no cath   History reviewed. No pertinent family history.  History  Substance Use Topics  . Smoking status: Former Smoker -- 1.00  packs/day for 60 years    Types: Cigarettes    Quit date: 10/08/1997  . Smokeless tobacco: Former Neurosurgeon  . Alcohol Use: No     Review of Systems  Constitutional: Positive for fatigue.  Respiratory: Negative for cough, shortness of breath and wheezing.   Cardiovascular: Negative for chest pain and palpitations.       Objective:   Physical Exam BP 130/62  Pulse 50  Temp(Src) 98.1 F (36.7 C) (Oral)  Wt 169 lb 12.8 oz (77.021 kg)  BMI 32.1 kg/m2  SpO2 95% Wt Readings from Last 3 Encounters:  01/15/13 169 lb 12.8 oz (77.021 kg)  01/11/13 174 lb 13.2 oz (79.3 kg)  01/11/13 174 lb 13.2 oz (79.3 kg)   Constitutional: She appears pale, chronically ill but well-developed and well-nourished. No distress. Dtr at side Neck: Thick. Normal range of motion. Neck supple. No JVD present. No thyromegaly present.  Cardiovascular: Normal rate, regular rhythm and normal heart sounds.  3/6 murmur heard. No BLE edema. Pulmonary/Chest: Effort normal and breath sounds diminished at bases. No respiratory distress. She has no wheezes.  Psychiatric: She has a normal mood and affect. Her behavior is normal. Judgment and thought content normal.   Lab Results  Component Value Date   WBC 11.0* 01/11/2013   HGB 9.7* 01/11/2013   HCT 29.1* 01/11/2013  PLT 306 01/11/2013   GLUCOSE 119* 01/11/2013   CHOL 144 05/03/2012   TRIG 264.0* 05/03/2012   HDL 43.30 05/03/2012   LDLDIRECT 79.7 05/03/2012   LDLCALC 60 03/24/2010   ALT 18 08/02/2012   AST 29 08/02/2012   NA 141 01/11/2013   K 4.0 01/11/2013   CL 105 01/11/2013   CREATININE 1.91* 01/11/2013   BUN 35* 01/11/2013   CO2 25 01/11/2013   TSH 0.838 01/03/2013   INR 1.14 01/04/2013   HGBA1C 7.9* 05/03/2012   MICROALBUR 5.6* 10/22/2008        Assessment & Plan:   See problem list. Medications and labs reviewed today.  Time spent with pt/family today 30 minutes, greater than 50% time spent counseling patient on hosp for MI/SVT, COPD, FTT and medication review. Also review  of hosp records

## 2013-01-15 NOTE — Assessment & Plan Note (Signed)
Chronic balance disorder, uses rolling walker for same Acute deconditioning with hospitalization early 01/2013, refer for home health evaluation/aide

## 2013-01-15 NOTE — Patient Instructions (Signed)
It was good to see you today. We have reviewed your prior records including labs and tests today Medications reviewed and updated, no changes recommended at this time. we'll make referral to [facility] or [facility] for home health assistance as discussed . Our office will contact you regarding appointment(s) once made. Please schedule followup in 3-4 months with Dr Debby Bud, call sooner if problems. Keep appointments with other specialists as planned

## 2013-01-15 NOTE — Assessment & Plan Note (Signed)
family reports dysfunctional home O2 tank sats okay on room air at this time No acute exacerbation, followup with pulmonary tomorrow as planned

## 2013-01-16 ENCOUNTER — Ambulatory Visit (INDEPENDENT_AMBULATORY_CARE_PROVIDER_SITE_OTHER)
Admission: RE | Admit: 2013-01-16 | Discharge: 2013-01-16 | Disposition: A | Discharge status: Home / Self Care | Payer: Medicare Other | Source: Ambulatory Visit | Attending: Pulmonary Disease | Admitting: Pulmonary Disease

## 2013-01-16 ENCOUNTER — Encounter: Payer: Self-pay | Admitting: Pulmonary Disease

## 2013-01-16 ENCOUNTER — Ambulatory Visit (INDEPENDENT_AMBULATORY_CARE_PROVIDER_SITE_OTHER): Payer: Medicare Other | Admitting: Pulmonary Disease

## 2013-01-16 VITALS — BP 122/76 | HR 59 | Ht 60.0 in | Wt 171.4 lb

## 2013-01-16 DIAGNOSIS — R0902 Hypoxemia: Secondary | ICD-10-CM

## 2013-01-16 DIAGNOSIS — J449 Chronic obstructive pulmonary disease, unspecified: Secondary | ICD-10-CM

## 2013-01-16 DIAGNOSIS — J9611 Chronic respiratory failure with hypoxia: Secondary | ICD-10-CM

## 2013-01-16 DIAGNOSIS — N184 Chronic kidney disease, stage 4 (severe): Secondary | ICD-10-CM

## 2013-01-16 DIAGNOSIS — J9610 Chronic respiratory failure, unspecified whether with hypoxia or hypercapnia: Secondary | ICD-10-CM

## 2013-01-16 DIAGNOSIS — I5043 Acute on chronic combined systolic (congestive) and diastolic (congestive) heart failure: Secondary | ICD-10-CM

## 2013-01-16 DIAGNOSIS — G4733 Obstructive sleep apnea (adult) (pediatric): Secondary | ICD-10-CM

## 2013-01-16 DIAGNOSIS — I509 Heart failure, unspecified: Secondary | ICD-10-CM

## 2013-01-16 MED ORDER — TIOTROPIUM BROMIDE MONOHYDRATE 18 MCG IN CAPS: 18.0000 ug | ORAL_CAPSULE | Freq: Every day | RESPIRATORY_TRACT | Status: DC

## 2013-01-16 NOTE — Progress Notes (Signed)
Chief Complaint  Patient presents with  . Follow-up    Pt states her breathing isokay as long as she uses her O2. She does have a productive cough w/ white phlem. denies any wheezing, chest tx . She uses 3 liters O2 24/7. Pt just got out of the hospital last week d/t heart attack.   . Sleep Apnea    Pt states she wears her CPAP about 4-5 nights. Pt states it had to much air seeping through the mask.     History of Present Illness: Megan Mathews is a 79 y.o. female former smoker with COPD, OSA using BPAP 10/7 and chronic hypoxic respiratory failure using 3 L O2.  She was in hospital recently for anemia and NSTEMI from PSVT >> she did not need heart cath, but did get transfusion of PRBC.  She has noticed trouble with air leaking from her BiPAP mask.  She has to tighten her mask, and then it fits better.  She has more frequent cough and sputum which is clear.  She is using her nebulizer at least once per day.  Tests: PFT 06/19/08>>FEV1 1.18(68%), FEV1% 63, TLC 3.15(72%), DLCO 43%, +BD  PSG 09/27/07>>AHI 11, O2 nadir 56% 03/19/09  Room air at rest SaO2 87% Spirometry 04/27/11>>FEV1 1.24(74%), FEV1% 76 Echo 01/05/13 >> mild LVH, EF 55 to 60%, grade 1 diastolic dysfx, mild AR, mild MR, mild RV dilation, mod TR, PAS 46 mmHg Spirometry 01/16/13 >> FEV1 1.10 (67%), FEV1% 67   She  has a past medical history of COPD (chronic obstructive pulmonary disease); OSA (obstructive sleep apnea); Hypoxemia; Secondary pulmonary hypertension (8/09); CAD (coronary artery disease) ('95,'96,'98.'99,'08); Diabetes mellitus type II; Hyperlipidemia; Hypertension; Peripheral vascular disease ('96, July 2010); Tobacco abuse; Constipation; Anxiety; Depression; Artery stenosis; Gastritis (04/2011); and NSTEMI (non-ST elevated myocardial infarction) (01/02/13).  She  has past surgical history that includes Carotid endarterectomy; Total abdominal hysterectomy; aaa stent (10/96); bilateral iliac stenting (10/96, 7/10);  resection of vocal chord lesions; excision serous cyst adenofibroma; Coronary angioplasty (2/95); Coronary angioplasty with stent (8/96); Coronary angioplasty with stent (5/98); Coronary angioplasty with stent (11/99); Coronary angiogram (12/08); and Thoracotomy (Left, 1960s).   Allergies  Allergen Reactions  . Amitriptyline Other (See Comments)    Took along with Prozac-caused very bad shaking in hands, could not coordinate movements.    . Contrast Media (Iodinated Diagnostic Agents)   . Iohexol Rash and Other (See Comments)    Fever, unable to stand also    Physical Exam:  General - obese, wearing oxygen  HEENT - no sinus tenderness, no oral exudate, wearing dentures, no LAN  Cardiac - s1s2 no murmur  Chest - prolonged exhalation, no wheeze/rales  Abd - soft, nontender  Ext - no edema  Neuro - decreased hearing, normal strength  CHEST - 2 VIEW 01/09/13 Comparison: 01/03/2013; 04/27/2011  Findings: Grossly unchanged enlarged cardiac silhouette and mediastinal contours. Atherosclerotic calcifications within the aortic arch. Pulmonary vasculature is indistinct with cephalization of flow. Worsening bilateral mid and lower lung heterogeneous air space opacities, right greater than left. Trace bilateral effusions are not excluded. No definite pneumothorax.  Unchanged bones. Surgical clips overlie the thoracic inlet and lower neck.  IMPRESSION:  Overall findings most suggestive of worsening pulmonary edema and bibasilar opacities, atelectasis versus infiltrate. A follow-up chest radiograph in 4 to 6 weeks after treatment is recommended to ensure resolution.   Assessment/Plan:  Coralyn Helling, MD [facility] Pager:  [phone]

## 2013-01-16 NOTE — Assessment & Plan Note (Signed)
She is having trouble with mask leak.  May need to adjust BiPAP settings. Will get BiPAP download, and call her with results.

## 2013-01-16 NOTE — Patient Instructions (Addendum)
Will get report from BiPAP machine and call with results Chest xray today >> will call with results Spiriva one puff daily Albuterol one vial in nebulizer up to four times per day as needed Proair two puffs up to four times per day as needed Follow up in 3 months

## 2013-01-16 NOTE — Assessment & Plan Note (Signed)
She had recent hospitalization for NSTEMI, PSVT, and heart failure.  Her most recent CXR showed changes likely from pulmonary edema.  Will repeat her CXR to document clearing of infiltrates.

## 2013-01-16 NOTE — Assessment & Plan Note (Signed)
She has progression of her symptoms of dyspnea with frequent use of albuterol.  Will add spiriva, and continue prn albuterol.

## 2013-01-16 NOTE — Assessment & Plan Note (Signed)
She had trouble with her portable oxygen tanks >> this was fixed by her DME.  She is to continue 3 liters oxygen 24/7.

## 2013-01-17 ENCOUNTER — Telehealth: Payer: Self-pay

## 2013-01-17 MED ORDER — AMITRIPTYLINE HCL 25 MG PO TABS: 25.0000 mg | ORAL_TABLET | Freq: Every day | ORAL | Status: DC

## 2013-01-17 NOTE — Telephone Encounter (Signed)
Received a fax from [facility] pharmacy for a refill on Amitriptyline 25 mg. I see at one time pt was on this but it is no longer on her current med list. She saw you yesterday for a hospital f/u. Does pt need this filled? Please advise, thanks.

## 2013-01-17 NOTE — Telephone Encounter (Signed)
Okay to resume amitriptyline, refill approved

## 2013-01-18 ENCOUNTER — Telehealth: Payer: Self-pay | Admitting: Pulmonary Disease

## 2013-01-18 NOTE — Telephone Encounter (Signed)
Dg Chest 2 View  01/16/2013   *RADIOLOGY REPORT*  Clinical Data: Follow up CHF, history MI 2 weeks ago, former smoker, diabetes, hypertension, COPD  CHEST - 2 VIEW  Comparison: 01/09/2013  Findings: Enlargement of cardiac silhouette. Pulmonary vascular congestion Calcified tortuous aorta. Changes of COPD and chronic bronchitis with improved pulmonary edema since previous exam. Small bibasilar effusions. No pneumothorax. Scattered endplate spur formation thoracic spine.  IMPRESSION: Improved CHF with small bibasilar effusions. Underlying COPD.   Original Report Authenticated By: Ulyses Southward, M.D.   Will have my nurse inform pt's daughter (pt has difficulty hearing on phone) that CXR looks better.  Fluid in lungs has resolved.  No change to current treatment plan.

## 2013-01-19 ENCOUNTER — Ambulatory Visit: Payer: Medicare Other | Admitting: Cardiology

## 2013-01-19 NOTE — Telephone Encounter (Signed)
Lupita Leash returned call. Hazel Sams

## 2013-01-19 NOTE — Telephone Encounter (Signed)
LM with pt's daughter Lupita Leash to call back

## 2013-01-22 NOTE — Telephone Encounter (Signed)
lmomtcb x1 for Donna.  

## 2013-01-23 ENCOUNTER — Encounter: Payer: Self-pay | Admitting: Physician Assistant

## 2013-01-23 ENCOUNTER — Ambulatory Visit (INDEPENDENT_AMBULATORY_CARE_PROVIDER_SITE_OTHER): Payer: Medicare Other | Admitting: Physician Assistant

## 2013-01-23 VITALS — BP 150/58 | HR 64 | Ht 60.0 in | Wt 170.9 lb

## 2013-01-23 DIAGNOSIS — G4733 Obstructive sleep apnea (adult) (pediatric): Secondary | ICD-10-CM

## 2013-01-23 DIAGNOSIS — D649 Anemia, unspecified: Secondary | ICD-10-CM

## 2013-01-23 DIAGNOSIS — E785 Hyperlipidemia, unspecified: Secondary | ICD-10-CM

## 2013-01-23 DIAGNOSIS — I10 Essential (primary) hypertension: Secondary | ICD-10-CM

## 2013-01-23 DIAGNOSIS — I2510 Atherosclerotic heart disease of native coronary artery without angina pectoris: Secondary | ICD-10-CM

## 2013-01-23 DIAGNOSIS — I7389 Other specified peripheral vascular diseases: Secondary | ICD-10-CM

## 2013-01-23 DIAGNOSIS — I471 Supraventricular tachycardia: Secondary | ICD-10-CM

## 2013-01-23 DIAGNOSIS — I498 Other specified cardiac arrhythmias: Secondary | ICD-10-CM

## 2013-01-23 NOTE — Assessment & Plan Note (Signed)
Improved hgb

## 2013-01-23 NOTE — Assessment & Plan Note (Signed)
Currently maintaining sinus bradycardia on EKG. Borderline first degree AV block PR interval 200 ms.

## 2013-01-23 NOTE — Assessment & Plan Note (Signed)
Mildly elevated today but is better when checked by RN at home.

## 2013-01-23 NOTE — Assessment & Plan Note (Signed)
Currently treated with simvastatin

## 2013-01-23 NOTE — Progress Notes (Addendum)
Date:  01/23/2013   ID:  Megan Mathews, DOB 1932-02-04, MRN [id number]  PCP:  Illene Regulus, MD  Primary Cardiologist:  Allyson Sabal     History of Present Illness: Megan Mathews is a 79 y.o. female was recently hospitalized from 01/02/2013 to 01/11/2013 with admission diagnosis of non-ST elevation myocardial infarction in the setting of PSVT. Her history also includes obstructive sleep apnea for which she uses a BiPAP, COPD, anemia likely related to chronic kidney disease, diabetes mellitus, supraventricular tachycardia, hypertension, hyperlipidemia, peripheral neurovascular disease with history of AO stent with PTA for ISR July 2010 and also chronic combined systolic and diastolic heart failure.  She underwent left heart cath in November 2008 demonstrated 40% taper distal left main stenosis but otherwise noncritical coronary artery disease. She did have patent stents in the LAD, circumflex and mid and distal RCA with normal LV function. She also has known renal artery stenosis right greater than left distal abdominal aorta and bilateral iliac stenting which were patent time of catheterization. She underwent bilateral iliac artery preintervention 03/30/2009.   Her troponin peaked at 0.89 and was felt to be the vein survivors demand ischemia. This is in the setting of substantial anemia with a hemoglobin of 7.5 which was corrected with blood transfusions, iron and EPO. Serum creatinine at the time was 2.03 which improved with hydration. Patient was given adenosine which converted the SVT. Consideration for left heart catheterization was discussed with the patient and family it was decided that medical therapy was the best option. Patient presents today for followup to hospitalization she currently has no complaints other than some chronic intermittent nausea. Otherwise denies vomiting, fever, chest pain, shortness of breath, orthopnea, PND, lower extremity edema, abdominal pain, hematochezia, melena. She did  have repeat labs showed a hemoglobin level of 11.4 this was on May 12.   Wt Readings from Last 3 Encounters:  01/23/13 170 lb 14.4 oz (77.52 kg)  01/16/13 171 lb 6.4 oz (77.747 kg)  01/15/13 169 lb 12.8 oz (77.021 kg)     Past Medical History  Diagnosis Date  . COPD (chronic obstructive pulmonary disease)     home 02  . OSA (obstructive sleep apnea)   . Hypoxemia   . Secondary pulmonary hypertension 8/09    "severe" by echo  . CAD (coronary artery disease) '95,'96,'98.'99,'08    Multiple PCI/stenting all 3 vessels  . Diabetes mellitus type II   . Hyperlipidemia   . Hypertension   . Peripheral vascular disease '96, July 2010    AoIliac stenting with PTA for ISR 7/10  . Tobacco abuse     quit '99  . Constipation   . Anxiety   . Depression   . Artery stenosis     renal   bilateral  . Gastritis 04/2011    mild gastritis on EGD  . NSTEMI (non-ST elevated myocardial infarction) 01/02/13    in setting of PSVT, +Trop, no ECG change, no cath    Current Outpatient Prescriptions  Medication Sig Dispense Refill  . acetaminophen (TYLENOL) 500 MG tablet Take 1,000 mg by mouth every 6 (six) hours as needed for pain.      Marland Kitchen albuterol (PROAIR HFA) 108 (90 BASE) MCG/ACT inhaler Inhale 2 puffs into the lungs every 4 (four) hours as needed (Cough, wheeze, or chest congestion). 2 puffs up to 4 times a day as needed  1 Inhaler  5  . albuterol (PROVENTIL) (2.5 MG/3ML) 0.083% nebulizer solution Take 2.5 mg by nebulization every 6 (  six) hours as needed. For shortness of breath      . ALPRAZolam (XANAX) 0.25 MG tablet Take 1 tablet (0.25 mg total) by mouth 2 (two) times daily as needed for anxiety.  60 tablet  0  . aspirin EC 81 MG EC tablet Take 1 tablet (81 mg total) by mouth daily.      . Calcium Carbonate-Vitamin D (CALCIUM 600+D) 600-400 MG-UNIT per tablet Take 1 tablet by mouth daily.        . clopidogrel (PLAVIX) 75 MG tablet Take 1 tablet (75 mg total) by mouth daily.  90 tablet  0  .  diltiazem (CARDIZEM CD) 180 MG 24 hr capsule Take 1 capsule (180 mg total) by mouth daily.  30 capsule  5  . FLUoxetine (PROZAC) 20 MG capsule Take 1 capsule (20 mg total) by mouth daily.  30 capsule  11  . furosemide (LASIX) 40 MG tablet Take 1 tablet (40 mg total) by mouth 2 (two) times daily.  30 tablet  5  . glimepiride (AMARYL) 2 MG tablet Take 1 tablet (2 mg total) by mouth daily.  30 tablet  6  . guaifenesin (MUCUS RELIEF) 400 MG TABS Take 400 mg by mouth 2 (two) times daily as needed. For congestion      . isosorbide mononitrate (IMDUR) 30 MG 24 hr tablet Take 3 tablets (90 mg total) by mouth daily.  90 tablet  5  . metFORMIN (GLUCOPHAGE) 1000 MG tablet Take 1 tablet (1,000 mg total) by mouth 2 (two) times daily with a meal.  180 tablet  3  . metoprolol succinate (TOPROL-XL) 50 MG 24 hr tablet Take 1 tablet (50 mg total) by mouth daily. Take with or immediately following a meal.  90 tablet  0  . Multiple Vitamins-Minerals (MULTIVITAL PO) Take 1 tablet by mouth daily.       . nitroGLYCERIN (NITROLINGUAL) 0.4 MG/SPRAY spray Place 1 spray under the tongue every 5 (five) minutes as needed for chest pain.      Marland Kitchen oxybutynin (DITROPAN-XL) 10 MG 24 hr tablet Take 1 tablet (10 mg total) by mouth daily.  90 tablet  1  . oxymetazoline (AFRIN NASAL SPRAY) 0.05 % nasal spray 1 spray into right nostril if bleeding won't stop.  May repeat once.      . pantoprazole (PROTONIX) 40 MG tablet Take 1 tablet (40 mg total) by mouth daily.  30 tablet  5  . pentoxifylline (TRENTAL) 400 MG CR tablet Take 1 by mouth three times day      . potassium chloride (MICRO-K) 10 MEQ CR capsule Take 20 mEq by mouth daily.      . simvastatin (ZOCOR) 80 MG tablet Take 1 tablet (80 mg total) by mouth at bedtime.  90 tablet  1  . tiotropium (SPIRIVA) 18 MCG inhalation capsule Place 1 capsule (18 mcg total) into inhaler and inhale daily.  30 capsule  5  . amitriptyline (ELAVIL) 25 MG tablet Take 1 tablet (25 mg total) by mouth at  bedtime.  270 tablet  3  . [DISCONTINUED] oxybutynin (DITROPAN-XL) 10 MG 24 hr tablet Take 1 tablet (10 mg total) by mouth daily.  90 tablet  1   No current facility-administered medications for this visit.    Allergies:    Allergies  Allergen Reactions  . Contrast Media (Iodinated Diagnostic Agents)   . Iohexol Rash and Other (See Comments)    Fever, unable to stand also    Social History:  The patient  reports that she quit smoking about 15 years ago. Her smoking use included Cigarettes. She has a 60 pack-year smoking history. She has quit using smokeless tobacco. She reports that she does not drink alcohol or use illicit drugs.   ROS:  Please see the history of present illness.  All other systems reviewed and negative.   PHYSICAL EXAM: VS:  BP 150/58  Pulse 64  Ht 5' (1.524 m)  Wt 170 lb 14.4 oz (77.52 kg)  BMI 33.38 kg/m2 Well nourished, well developed, in no acute distress HEENT: Pupils are equal round react to light accommodation extraocular movements are intact.  Neck: No cervical lymphadenopathy.  Loud bilateral carotid bruit Cardiac: Regular rate and rhythm with 1/6 sys murmur, no rubs or gallops. Lungs:  clear to auscultation bilaterally, no wheezing, rhonchi or rales Abd: soft, nontender, positive bowel sounds all quadrants, no hepatosplenomegaly Ext: no lower extremity edema.  2+ radial and 1+ dorsalis pedis pulses. Skin: warm and dry Neuro:  Grossly normal, decreased hearing  EKG:  Sinus bradycardia rate of 52 beats per minute PR interval 200 ms.    ASSESSMENT AND PLAN:  Problem List Items Addressed This Visit   OBSTRUCTIVE SLEEP APNEA- BiPAP (Chronic)     Treated with BiCPAP    CAD, multiple PCI/stents. Last cath 12/08- stents patent, 40% LM- med Rx - Primary (Chronic)     Medical therapy was decided to her recent hospitalization. Patient is currently on aspirin Imdur, Toprol, and statin. She is asymptomatic.    Relevant Orders      EKG 12-Lead   HTN  (hypertension) (Chronic)     Mildly elevated today but is better when checked by RN at home.    HLD (hyperlipidemia) (Chronic)      Currently treated with simvastatin    PERIPHERAL VASCULAR DISEASE WITH CLAUDICATION     Asymptomatic     Anemia, transfused 2 units PRBC 01/06/2013, (heme negative)     Improved hgb 11.4    SVT (supraventricular tachycardia)     Currently maintaining sinus bradycardia on EKG. Borderline first degree AV block PR interval 200 ms.      Followup in 6 months with Dr. Allyson Sabal

## 2013-01-23 NOTE — Patient Instructions (Addendum)
Follow-up in six months.

## 2013-01-23 NOTE — Assessment & Plan Note (Addendum)
Treated with BiCPAP

## 2013-01-23 NOTE — Assessment & Plan Note (Addendum)
Medical therapy was decided to her recent hospitalization. Patient is currently on aspirin Imdur, Toprol, and statin. She is asymptomatic.

## 2013-01-23 NOTE — Assessment & Plan Note (Signed)
Asymptomatic. 

## 2013-01-25 NOTE — Telephone Encounter (Signed)
Megan Mathews is aware.

## 2013-01-30 ENCOUNTER — Other Ambulatory Visit: Payer: Self-pay

## 2013-01-30 MED ORDER — SIMVASTATIN 80 MG PO TABS: 80.0000 mg | ORAL_TABLET | Freq: Every day | ORAL | Status: DC

## 2013-02-08 ENCOUNTER — Telehealth: Payer: Self-pay | Admitting: Pulmonary Disease

## 2013-02-08 DIAGNOSIS — G4733 Obstructive sleep apnea (adult) (pediatric): Secondary | ICD-10-CM

## 2013-02-08 NOTE — Telephone Encounter (Addendum)
Auto BiPAP 01/12/13 to 01/25/13 >> Used on 14 of 14 nights with average 6 hrs 16 min.  Average AHI 2.3 with 90 th percentile IPAP 9 cm H2O and 90 th percentile EPAP 6 cm H2O.  Will have my nurse inform pt's daughter that BiPAP report shows good control of sleep apnea.  Will have her DME adjust her settings to lower range to determine if this improves problems with mask leak.  She is to call back if she continues to have trouble.  Changed to auto BiPAP range 5 to 10 cm H2O.

## 2013-02-08 NOTE — Telephone Encounter (Signed)
Pt is aware of her download results. Her daughter Lupita Leash, is in the hospital and pt asked that we not bother her.  An order has already been sent to have her pressure changed.

## 2013-03-04 ENCOUNTER — Other Ambulatory Visit: Payer: Self-pay | Admitting: Internal Medicine

## 2013-03-05 NOTE — Telephone Encounter (Signed)
Alprazolam called to pharmacy  

## 2013-03-19 ENCOUNTER — Other Ambulatory Visit: Payer: Self-pay | Admitting: Pulmonary Disease

## 2013-04-02 ENCOUNTER — Other Ambulatory Visit: Payer: Self-pay | Admitting: Internal Medicine

## 2013-04-02 ENCOUNTER — Other Ambulatory Visit: Payer: Self-pay | Admitting: *Deleted

## 2013-04-02 MED ORDER — METOPROLOL SUCCINATE ER 50 MG PO TB24: 50.0000 mg | ORAL_TABLET | Freq: Every day | ORAL | Status: DC

## 2013-04-09 ENCOUNTER — Other Ambulatory Visit: Payer: Self-pay

## 2013-04-09 ENCOUNTER — Encounter: Payer: Self-pay | Admitting: Internal Medicine

## 2013-04-09 ENCOUNTER — Ambulatory Visit (INDEPENDENT_AMBULATORY_CARE_PROVIDER_SITE_OTHER): Payer: Medicare Other | Admitting: Internal Medicine

## 2013-04-09 VITALS — BP 108/52 | HR 58 | Temp 96.7°F | Resp 12 | Wt 168.0 lb

## 2013-04-09 DIAGNOSIS — I10 Essential (primary) hypertension: Secondary | ICD-10-CM

## 2013-04-09 DIAGNOSIS — E1149 Type 2 diabetes mellitus with other diabetic neurological complication: Secondary | ICD-10-CM

## 2013-04-09 DIAGNOSIS — E785 Hyperlipidemia, unspecified: Secondary | ICD-10-CM

## 2013-04-09 DIAGNOSIS — N184 Chronic kidney disease, stage 4 (severe): Secondary | ICD-10-CM

## 2013-04-09 DIAGNOSIS — I2510 Atherosclerotic heart disease of native coronary artery without angina pectoris: Secondary | ICD-10-CM

## 2013-04-09 DIAGNOSIS — D649 Anemia, unspecified: Secondary | ICD-10-CM

## 2013-04-09 MED ORDER — OXYBUTYNIN CHLORIDE ER 10 MG PO TB24: 10.0000 mg | ORAL_TABLET | Freq: Every day | ORAL | Status: DC

## 2013-04-09 MED ORDER — FUROSEMIDE 40 MG PO TABS: 40.0000 mg | ORAL_TABLET | Freq: Two times a day (BID) | ORAL | Status: DC

## 2013-04-09 NOTE — Telephone Encounter (Signed)
Rx was sent to pharmacy electronically. 

## 2013-04-09 NOTE — Assessment & Plan Note (Signed)
Lab Results  Component Value Date   HGBA1C 7.9* 05/03/2012   Plan Follow up A1C with recommendations to follow

## 2013-04-09 NOTE — Assessment & Plan Note (Signed)
Taking and tolerating medication.  Plan Follow up lipid panel with recommendations to follow 

## 2013-04-09 NOTE — Assessment & Plan Note (Signed)
Appears stable.  Plan H/H for follow up, recommendations to follow

## 2013-04-09 NOTE — Assessment & Plan Note (Signed)
Reviewed hospital records and labs.  Plan Follow up Bmet

## 2013-04-09 NOTE — Assessment & Plan Note (Signed)
BP Readings from Last 3 Encounters:  04/09/13 108/52  01/23/13 150/58  01/16/13 122/76   Good control with a bit of a low reading today.  Plan Continue present medications.

## 2013-04-09 NOTE — Assessment & Plan Note (Signed)
Taking and tolerating medication.  Plan Follow up lipid panel with recommendations to follow

## 2013-04-09 NOTE — Assessment & Plan Note (Signed)
Stable at today's visit with no chest pain. She is current with her cardiologist  Plan Continue present medical management

## 2013-04-09 NOTE — Patient Instructions (Addendum)
Good to see you. You seem to be doing well at this time. All your problems seem stable.  We will check your labs today: blood sugar, cholesterol, chemistries, etc and you will get a letter with the results.  Please continue all your present medications.  I would like to see you in 3 months for routine follow-up.

## 2013-04-09 NOTE — Progress Notes (Signed)
Subjective:    Patient ID: Megan Mathews, female    DOB: 1932/07/23, 79 y.o.   MRN: [id number]  HPI Chart reviewed: she had NSTEMI secondary to PSVT in April. She did well but did have acute on chronic CKD, acute combined systolic/diastolic heart failure along with respiratory distress. She has been seen since discharge by Dr. Felicity Coyer May 12th, Dr. Craige Cotta May 13th and [facility] as ov [place].   She presents today for routine follow up. She is tired all the time but "she is still breathing."  She is pain free re: heart. She does have chronic back pain. Lumbar spine films 2010 with OA.   She is due for follow up lab work: renal function and electrolytes.\  Past Medical History  Diagnosis Date  . COPD (chronic obstructive pulmonary disease)     home 02  . OSA (obstructive sleep apnea)   . Hypoxemia   . Secondary pulmonary hypertension 8/09    "severe" by echo  . CAD (coronary artery disease) '95,'96,'98.'99,'08    Multiple PCI/stenting all 3 vessels  . Diabetes mellitus type II   . Hyperlipidemia   . Hypertension   . Peripheral vascular disease '96, July 2010    AoIliac stenting with PTA for ISR 7/10  . Tobacco abuse     quit '99  . Constipation   . Anxiety   . Depression   . Artery stenosis     renal   bilateral  . Gastritis 04/2011    mild gastritis on EGD  . NSTEMI (non-ST elevated myocardial infarction) 01/02/13    in setting of PSVT, +Trop, no ECG change, no cath   Past Surgical History  Procedure Laterality Date  . Carotid endarterectomy      bilat with redo on Lt  . Total abdominal hysterectomy    . Aaa stent  10/96  . Bilateral iliac stenting  10/96, 7/10  . Resection of vocal chord lesions    . Excision serous cyst adenofibroma    . Coronary angioplasty  2/95    RCA  . Coronary angioplasty with stent placement  8/96    RCA  . Coronary angioplasty with stent placement  5/98    RCA X 2  . Coronary angioplasty with stent placement  11/99    CFX,LAD,Dx  . Coronary  angiogram  12/08    patent stents, 40% LM  . Thoracotomy Left 1960s    removal of "glandular tumor" (pt's words)   History reviewed. No pertinent family history. History   Social History  . Marital Status: Widowed    Spouse Name: N/A    Number of Children: N/A  . Years of Education: N/A   Occupational History  . Not on file.   Social History Main Topics  . Smoking status: Former Smoker -- 1.00 packs/day for 60 years    Types: Cigarettes    Quit date: 10/08/1997  . Smokeless tobacco: Former Neurosurgeon  . Alcohol Use: No  . Drug Use: No  . Sexually Active: No   Other Topics Concern  . Not on file   Social History Narrative   Widowed. Daughters are very supportive - which allows her to live alone. End-of-Life care: discussed living will, HCPOA and MOST form. All are provided to the patient for her to consider with the help of her family. Out of Facilty Order and blank MOST form signed. (07/22/10)    Current Outpatient Prescriptions on File Prior to Visit  Medication Sig Dispense Refill  .  acetaminophen (TYLENOL) 500 MG tablet Take 1,000 mg by mouth every 6 (six) hours as needed for pain.      Marland Kitchen albuterol (PROAIR HFA) 108 (90 BASE) MCG/ACT inhaler Inhale 2 puffs into the lungs every 4 (four) hours as needed (Cough, wheeze, or chest congestion). 2 puffs up to 4 times a day as needed  1 Inhaler  5  . albuterol (PROVENTIL) (2.5 MG/3ML) 0.083% nebulizer solution Take 2.5 mg by nebulization every 6 (six) hours as needed. For shortness of breath      . albuterol (PROVENTIL) (2.5 MG/3ML) 0.083% nebulizer solution USE 1 VIAL IN NEBULIZER EVERY 6 HOURS AS NEEDED  75 mL  5  . ALPRAZolam (XANAX) 0.25 MG tablet TAKE 1 TABLET BY MOUTH TWICE DAILY AS NEEDED  60 tablet  0  . aspirin EC 81 MG EC tablet Take 1 tablet (81 mg total) by mouth daily.      . Calcium Carbonate-Vitamin D (CALCIUM 600+D) 600-400 MG-UNIT per tablet Take 1 tablet by mouth daily.        . clopidogrel (PLAVIX) 75 MG tablet Take 1  tablet (75 mg total) by mouth daily.  90 tablet  0  . diltiazem (CARDIZEM CD) 180 MG 24 hr capsule Take 1 capsule (180 mg total) by mouth daily.  30 capsule  5  . FLUoxetine (PROZAC) 20 MG capsule Take 1 capsule (20 mg total) by mouth daily.  30 capsule  11  . furosemide (LASIX) 40 MG tablet Take 1 tablet (40 mg total) by mouth 2 (two) times daily.  30 tablet  5  . glimepiride (AMARYL) 2 MG tablet Take 1 tablet (2 mg total) by mouth daily.  30 tablet  6  . guaifenesin (MUCUS RELIEF) 400 MG TABS Take 400 mg by mouth 2 (two) times daily as needed. For congestion      . isosorbide mononitrate (IMDUR) 30 MG 24 hr tablet Take 3 tablets (90 mg total) by mouth daily.  90 tablet  5  . metFORMIN (GLUCOPHAGE) 1000 MG tablet Take 1 tablet (1,000 mg total) by mouth 2 (two) times daily with a meal.  180 tablet  3  . metoprolol succinate (TOPROL-XL) 50 MG 24 hr tablet Take 1 tablet (50 mg total) by mouth daily. Take with or immediately following a meal.  90 tablet  0  . Multiple Vitamins-Minerals (MULTIVITAL PO) Take 1 tablet by mouth daily.       . nitroGLYCERIN (NITROLINGUAL) 0.4 MG/SPRAY spray Place 1 spray under the tongue every 5 (five) minutes as needed for chest pain.      Marland Kitchen oxymetazoline (AFRIN NASAL SPRAY) 0.05 % nasal spray 1 spray into right nostril if bleeding won't stop.  May repeat once.      . pantoprazole (PROTONIX) 40 MG tablet Take 1 tablet (40 mg total) by mouth daily.  30 tablet  5  . pentoxifylline (TRENTAL) 400 MG CR tablet Take 1 by mouth three times day      . potassium chloride (MICRO-K) 10 MEQ CR capsule Take 20 mEq by mouth daily.      . simvastatin (ZOCOR) 80 MG tablet Take 1 tablet (80 mg total) by mouth at bedtime.  90 tablet  1  . tiotropium (SPIRIVA) 18 MCG inhalation capsule Place 1 capsule (18 mcg total) into inhaler and inhale daily.  30 capsule  5  . [DISCONTINUED] oxybutynin (DITROPAN-XL) 10 MG 24 hr tablet Take 1 tablet (10 mg total) by mouth daily.  90 tablet  1  No  current facility-administered medications on file prior to visit.      Review of Systems System review is negative for any constitutional, cardiac, pulmonary, GI or neuro symptoms or complaints other than as described in the HPI.     Objective:   Physical Exam Filed Vitals:   04/09/13 1011  BP: 108/52  Pulse: 58  Temp: 96.7 F (35.9 C)  Resp: 12   Wt Readings from Last 3 Encounters:  04/09/13 168 lb (76.204 kg)  01/23/13 170 lb 14.4 oz (77.52 kg)  01/16/13 171 lb 6.4 oz (77.747 kg)   BP Readings from Last 3 Encounters:  04/09/13 108/52  01/23/13 150/58  01/16/13 122/76   Gen'l- overweight elderly white woman in no distress, on O2.  HEENT- [place]/AT, C&S clear Cor- 2+ radial pulse, heart sounds very distant, seems regular PUlm - rales at left base but otherwise clear, no wheezing. Abd- soft, no guarding or rebound. MSK - no joint deformity Neuro - awake and alert, no focal deficits except HOH.          Assessment & Plan:

## 2013-04-16 ENCOUNTER — Other Ambulatory Visit (INDEPENDENT_AMBULATORY_CARE_PROVIDER_SITE_OTHER): Payer: Medicare Other

## 2013-04-16 DIAGNOSIS — D649 Anemia, unspecified: Secondary | ICD-10-CM

## 2013-04-16 DIAGNOSIS — E785 Hyperlipidemia, unspecified: Secondary | ICD-10-CM

## 2013-04-16 DIAGNOSIS — I10 Essential (primary) hypertension: Secondary | ICD-10-CM

## 2013-04-16 DIAGNOSIS — E1149 Type 2 diabetes mellitus with other diabetic neurological complication: Secondary | ICD-10-CM

## 2013-04-16 DIAGNOSIS — N184 Chronic kidney disease, stage 4 (severe): Secondary | ICD-10-CM

## 2013-04-16 LAB — MICROALBUMIN / CREATININE URINE RATIO: Creatinine,U: 59.4 mg/dL

## 2013-04-16 LAB — COMPREHENSIVE METABOLIC PANEL
ALT: 13 U/L (ref 0–35)
AST: 17 U/L (ref 0–37)
Albumin: 4 g/dL (ref 3.5–5.2)
Alkaline Phosphatase: 32 U/L — ABNORMAL LOW (ref 39–117)
Potassium: 4.5 mEq/L (ref 3.5–5.1)
Sodium: 142 mEq/L (ref 135–145)
Total Protein: 7.9 g/dL (ref 6.0–8.3)

## 2013-04-16 LAB — LIPID PANEL
Total CHOL/HDL Ratio: 4
Triglycerides: 274 mg/dL — ABNORMAL HIGH (ref 0.0–149.0)

## 2013-04-16 LAB — HEPATIC FUNCTION PANEL
ALT: 13 U/L (ref 0–35)
Albumin: 4 g/dL (ref 3.5–5.2)
Total Protein: 7.9 g/dL (ref 6.0–8.3)

## 2013-04-16 LAB — HEMOGLOBIN AND HEMATOCRIT, BLOOD: Hemoglobin: 11 g/dL — ABNORMAL LOW (ref 12.0–15.0)

## 2013-04-16 LAB — LDL CHOLESTEROL, DIRECT: Direct LDL: 81.8 mg/dL

## 2013-04-24 ENCOUNTER — Encounter: Payer: Self-pay | Admitting: Internal Medicine

## 2013-04-30 ENCOUNTER — Other Ambulatory Visit: Payer: Self-pay

## 2013-04-30 MED ORDER — NYSTATIN 100000 UNIT/GM EX POWD: CUTANEOUS | Status: DC

## 2013-05-14 ENCOUNTER — Other Ambulatory Visit: Payer: Self-pay | Admitting: Internal Medicine

## 2013-05-14 NOTE — Telephone Encounter (Signed)
Alprazolam called to pharmacy  

## 2013-05-29 ENCOUNTER — Other Ambulatory Visit: Payer: Self-pay | Admitting: Internal Medicine

## 2013-06-11 ENCOUNTER — Other Ambulatory Visit: Payer: Self-pay | Admitting: Internal Medicine

## 2013-06-25 ENCOUNTER — Other Ambulatory Visit: Payer: Self-pay | Admitting: Internal Medicine

## 2013-06-25 ENCOUNTER — Ambulatory Visit (INDEPENDENT_AMBULATORY_CARE_PROVIDER_SITE_OTHER): Payer: Medicare Other | Admitting: Pulmonary Disease

## 2013-06-25 ENCOUNTER — Encounter: Payer: Self-pay | Admitting: Pulmonary Disease

## 2013-06-25 ENCOUNTER — Other Ambulatory Visit: Payer: Self-pay | Admitting: Cardiovascular Disease

## 2013-06-25 VITALS — BP 118/76 | HR 60 | Ht 60.0 in | Wt 161.0 lb

## 2013-06-25 DIAGNOSIS — J9611 Chronic respiratory failure with hypoxia: Secondary | ICD-10-CM

## 2013-06-25 DIAGNOSIS — R0902 Hypoxemia: Secondary | ICD-10-CM

## 2013-06-25 DIAGNOSIS — G4733 Obstructive sleep apnea (adult) (pediatric): Secondary | ICD-10-CM

## 2013-06-25 DIAGNOSIS — J9610 Chronic respiratory failure, unspecified whether with hypoxia or hypercapnia: Secondary | ICD-10-CM

## 2013-06-25 DIAGNOSIS — J449 Chronic obstructive pulmonary disease, unspecified: Secondary | ICD-10-CM

## 2013-06-25 NOTE — Assessment & Plan Note (Signed)
Improved since addition of spiriva.

## 2013-06-25 NOTE — Progress Notes (Signed)
Chief Complaint  Patient presents with  . Follow-up    Breathing is doing well. Using CPAP machine from time to time. No new complaints.    History of Present Illness: Megan Mathews is a 79 y.o. female former smoker with COPD, OSA using BPAP 10/7 and chronic hypoxic respiratory failure using 3 L O2.  She feels her breathing is better since starting spiriva.  She uses albuterol a couple of times per week.  She is not having cough, wheeze, or sputum.  She denies ankle swelling.  She uses 3 liters oxygen 24/7.  She uses her BiPAP on most nights.  She sometimes has trouble falling or staying asleep, and will not use her BiPAP then.  She is not taking naps.  She will sometimes get dry mouth at night, and sometimes have water build up in her mask.  Tests: PFT 06/19/08>>FEV1 1.18(68%), FEV1% 63, TLC 3.15(72%), DLCO 43%, +BD  PSG 09/27/07>>AHI 11, O2 nadir 56% 03/19/09  Room air at rest SaO2 87% Spirometry 04/27/11>>FEV1 1.24(74%), FEV1% 76 Echo 01/05/13 >> mild LVH, EF 55 to 60%, grade 1 diastolic dysfx, mild AR, mild MR, mild RV dilation, mod TR, PAS 46 mmHg Spirometry 01/16/13 >> FEV1 1.10 (67%), FEV1% 67   She  has a past medical history of COPD (chronic obstructive pulmonary disease); OSA (obstructive sleep apnea); Hypoxemia; Secondary pulmonary hypertension (8/09); CAD (coronary artery disease) ('95,'96,'98.'99,'08); Diabetes mellitus type II; Hyperlipidemia; Hypertension; Peripheral vascular disease ('96, July 2010); Tobacco abuse; Constipation; Anxiety; Depression; Artery stenosis; Gastritis (04/2011); and NSTEMI (non-ST elevated myocardial infarction) (01/02/13).  She  has past surgical history that includes Carotid endarterectomy; Total abdominal hysterectomy; aaa stent (10/96); bilateral iliac stenting (10/96, 7/10); resection of vocal chord lesions; excision serous cyst adenofibroma; Coronary angioplasty (2/95); Coronary angioplasty with stent (8/96); Coronary angioplasty with stent (5/98);  Coronary angioplasty with stent (11/99); Coronary angiogram (12/08); and Thoracotomy (Left, 1960s).   Allergies  Allergen Reactions  . Contrast Media [Iodinated Diagnostic Agents]   . Iohexol Rash and Other (See Comments)    Fever, unable to stand also    Physical Exam:  General - obese, wearing oxygen  HEENT - no sinus tenderness, no oral exudate, wearing dentures, no LAN  Cardiac - s1s2 no murmur  Chest - prolonged exhalation, no wheeze/rales  Abd - soft, nontender  Ext - no edema  Neuro - decreased hearing, normal strength   Assessment/Plan:  Coralyn Helling, MD [place] [facility] Pager:  [phone]

## 2013-06-25 NOTE — Assessment & Plan Note (Signed)
She is to continue 3 liters oxygen 24/7.

## 2013-06-25 NOTE — Telephone Encounter (Signed)
Rx was sent to pharmacy electronically. 

## 2013-06-25 NOTE — Assessment & Plan Note (Signed)
She is doing well with BiPAP.  Discussed mask fit, and how to avoid ran out.

## 2013-06-25 NOTE — Patient Instructions (Signed)
Follow up in 6 months 

## 2013-07-09 ENCOUNTER — Other Ambulatory Visit: Payer: Self-pay | Admitting: Internal Medicine

## 2013-07-14 ENCOUNTER — Other Ambulatory Visit: Payer: Self-pay | Admitting: Internal Medicine

## 2013-07-18 ENCOUNTER — Other Ambulatory Visit: Payer: Self-pay | Admitting: Pulmonary Disease

## 2013-07-24 ENCOUNTER — Other Ambulatory Visit: Payer: Self-pay | Admitting: Internal Medicine

## 2013-07-27 ENCOUNTER — Telehealth (HOSPITAL_COMMUNITY): Payer: Self-pay | Admitting: *Deleted

## 2013-09-03 ENCOUNTER — Other Ambulatory Visit: Payer: Self-pay | Admitting: Internal Medicine

## 2013-09-03 ENCOUNTER — Other Ambulatory Visit: Payer: Self-pay | Admitting: Cardiovascular Disease

## 2013-09-03 NOTE — Telephone Encounter (Signed)
Rx was sent to pharmacy electronically. 

## 2013-09-10 ENCOUNTER — Other Ambulatory Visit: Payer: Self-pay | Admitting: Internal Medicine

## 2013-09-17 ENCOUNTER — Other Ambulatory Visit: Payer: Self-pay | Admitting: Internal Medicine

## 2013-10-01 ENCOUNTER — Other Ambulatory Visit: Payer: Self-pay | Admitting: Internal Medicine

## 2013-10-09 ENCOUNTER — Ambulatory Visit: Payer: Medicare Other | Admitting: Cardiovascular Disease

## 2013-10-19 ENCOUNTER — Encounter: Payer: Self-pay | Admitting: Internal Medicine

## 2013-10-23 ENCOUNTER — Ambulatory Visit: Payer: Medicare Other | Admitting: Cardiovascular Disease

## 2013-10-23 ENCOUNTER — Other Ambulatory Visit: Payer: Self-pay | Admitting: Internal Medicine

## 2013-10-29 ENCOUNTER — Other Ambulatory Visit: Payer: Self-pay | Admitting: Internal Medicine

## 2013-11-06 ENCOUNTER — Ambulatory Visit (INDEPENDENT_AMBULATORY_CARE_PROVIDER_SITE_OTHER): Payer: Medicare Other | Admitting: Internal Medicine

## 2013-11-06 ENCOUNTER — Other Ambulatory Visit (INDEPENDENT_AMBULATORY_CARE_PROVIDER_SITE_OTHER): Payer: Medicare Other

## 2013-11-06 ENCOUNTER — Encounter: Payer: Self-pay | Admitting: Internal Medicine

## 2013-11-06 VITALS — BP 122/52 | HR 60 | Temp 97.5°F | Wt 150.6 lb

## 2013-11-06 DIAGNOSIS — N184 Chronic kidney disease, stage 4 (severe): Secondary | ICD-10-CM

## 2013-11-06 DIAGNOSIS — I10 Essential (primary) hypertension: Secondary | ICD-10-CM

## 2013-11-06 DIAGNOSIS — D649 Anemia, unspecified: Secondary | ICD-10-CM

## 2013-11-06 DIAGNOSIS — E1149 Type 2 diabetes mellitus with other diabetic neurological complication: Secondary | ICD-10-CM

## 2013-11-06 LAB — HEMOGLOBIN AND HEMATOCRIT, BLOOD
HCT: 29.1 % — ABNORMAL LOW (ref 36.0–46.0)
Hemoglobin: 9.4 g/dL — ABNORMAL LOW (ref 12.0–15.0)

## 2013-11-06 LAB — BASIC METABOLIC PANEL
BUN: 50 mg/dL — AB (ref 6–23)
CHLORIDE: 103 meq/L (ref 96–112)
CO2: 25 mEq/L (ref 19–32)
CREATININE: 2.6 mg/dL — AB (ref 0.4–1.2)
Calcium: 10.1 mg/dL (ref 8.4–10.5)
GFR: 18.59 mL/min — ABNORMAL LOW (ref >60.00)
Glucose, Bld: 79 mg/dL (ref 70–99)
Potassium: 4.9 mEq/L (ref 3.5–5.1)
Sodium: 140 mEq/L (ref 135–145)

## 2013-11-06 LAB — HEMOGLOBIN A1C: Hgb A1c MFr Bld: 6 % (ref 4.6–6.5)

## 2013-11-06 NOTE — Patient Instructions (Signed)
Sorry to have kept you waiting.  Plan is to check you diabetes, your kidney function, your electrolytes and a blood count.  You will be notified by mail of the results.   Please continue all your medications. Say hello to Dr. Allyson SabalBerry for me.  As I retire your new doctor will be Dr. Efrain SellaJim John. You will be scheduled for an appointment in August 2015

## 2013-11-06 NOTE — Progress Notes (Signed)
Subjective:    Patient ID: Megan Mathews, female    DOB: Jul 24, 1932, 80 y.o.   MRN: [id number]  HPI Megan Mathews presents for follow up of her diabetes. She has multiple medical problems and follows with cardiology and pulmonary. She is very hard of hearing and her daughter is the primary historian. In the interval since her last visit she has been stable. No change in condition or medications.  Past Medical History  Diagnosis Date  . COPD (chronic obstructive pulmonary disease)     home 02  . OSA (obstructive sleep apnea)   . Hypoxemia   . Secondary pulmonary hypertension 8/09    "severe" by echo  . CAD (coronary artery disease) '95,'96,'98.'99,'08    Multiple PCI/stenting all 3 vessels  . Diabetes mellitus type II   . Hyperlipidemia   . Hypertension   . Peripheral vascular disease '96, July 2010    AoIliac stenting with PTA for ISR 7/10  . Tobacco abuse     quit '99  . Constipation   . Anxiety   . Depression   . Artery stenosis     renal   bilateral  . Gastritis 04/2011    mild gastritis on EGD  . NSTEMI (non-ST elevated myocardial infarction) 01/02/13    in setting of PSVT, +Trop, no ECG change, no cath   Past Surgical History  Procedure Laterality Date  . Carotid endarterectomy      bilat with redo on Lt  . Total abdominal hysterectomy    . Aaa stent  10/96  . Bilateral iliac stenting  10/96, 7/10  . Resection of vocal chord lesions    . Excision serous cyst adenofibroma    . Coronary angioplasty  2/95    RCA  . Coronary angioplasty with stent placement  8/96    RCA  . Coronary angioplasty with stent placement  5/98    RCA X 2  . Coronary angioplasty with stent placement  11/99    CFX,LAD,Dx  . Coronary angiogram  12/08    patent stents, 40% LM  . Thoracotomy Left 1960s    removal of "glandular tumor" (pt's words)   History reviewed. No pertinent family history. History   Social History  . Marital Status: Widowed    Spouse Name: N/A    Number of  Children: N/A  . Years of Education: N/A   Occupational History  . Not on file.   Social History Main Topics  . Smoking status: Former Smoker -- 1.00 packs/day for 60 years    Types: Cigarettes    Quit date: 10/08/1997  . Smokeless tobacco: Former Neurosurgeon  . Alcohol Use: No  . Drug Use: No  . Sexual Activity: No   Other Topics Concern  . Not on file   Social History Narrative   Widowed. Daughters are very supportive - which allows her to live alone. End-of-Life care: discussed living will, HCPOA and MOST form. All are provided to the patient for her to consider with the help of her family. Out of Facilty Order and blank MOST form signed. (07/22/10)    Current Outpatient Prescriptions on File Prior to Visit  Medication Sig Dispense Refill  . acetaminophen (TYLENOL) 500 MG tablet Take 1,000 mg by mouth every 6 (six) hours as needed for pain.      Marland Kitchen albuterol (PROAIR HFA) 108 (90 BASE) MCG/ACT inhaler Inhale 2 puffs into the lungs every 4 (four) hours as needed (Cough, wheeze, or chest congestion). 2 puffs  up to 4 times a day as needed  1 Inhaler  5  . albuterol (PROVENTIL) (2.5 MG/3ML) 0.083% nebulizer solution Take 2.5 mg by nebulization every 6 (six) hours as needed. For shortness of breath      . albuterol (PROVENTIL) (2.5 MG/3ML) 0.083% nebulizer solution USE 1 VIAL IN NEBULIZER EVERY 6 HOURS AS NEEDED  75 mL  5  . ALPRAZolam (XANAX) 0.25 MG tablet TAKE 1 TABLET BY MOUTH TWICE DAILY AS NEEDED  60 tablet  0  . aspirin EC 81 MG EC tablet Take 1 tablet (81 mg total) by mouth daily.      . Calcium Carbonate-Vitamin D (CALCIUM 600+D) 600-400 MG-UNIT per tablet Take 1 tablet by mouth daily.        Marland Kitchen. CARTIA XT 180 MG 24 hr capsule TAKE 1 CAPSULE BY MOUTH ONCE DAILY  30 capsule  6  . clopidogrel (PLAVIX) 75 MG tablet TAKE 1 TABLET BY MOUTH DAILY  90 tablet  3  . FLUoxetine (PROZAC) 20 MG capsule TAKE ONE CAPSULE BY MOUTH DAILY  30 capsule  0  . furosemide (LASIX) 40 MG tablet Take 1 tablet  (40 mg total) by mouth 2 (two) times daily.  60 tablet  9  . glimepiride (AMARYL) 2 MG tablet TAKE 1 TABLET BY MOUTH DAILY  30 tablet  5  . guaifenesin (MUCUS RELIEF) 400 MG TABS Take 400 mg by mouth 2 (two) times daily as needed. For congestion      . isosorbide mononitrate (IMDUR) 30 MG 24 hr tablet Take 3 tablets (90 mg total) by mouth daily.  90 tablet  5  . metFORMIN (GLUCOPHAGE) 1000 MG tablet TAKE 1 TABLET BY MOUTH TWICE DAILY WITH A MEAL  180 tablet  3  . metoprolol succinate (TOPROL-XL) 50 MG 24 hr tablet Take 1 tablet (50 mg total) by mouth daily. Take with or immediately following a meal.  90 tablet  0  . metoprolol succinate (TOPROL-XL) 50 MG 24 hr tablet TAKE 1 TABLET BY MOUTH DAILY, TAKE WITH OR IMMEDIATELY FOLLOWING A MEAL  90 tablet  0  . Multiple Vitamins-Minerals (MULTIVITAL PO) Take 1 tablet by mouth daily.       . nitroGLYCERIN (NITROLINGUAL) 0.4 MG/SPRAY spray Place 1 spray under the tongue every 5 (five) minutes as needed for chest pain.      Marland Kitchen. nystatin (MYCOSTATIN/NYSTOP) 100000 UNIT/GM POWD APPLY TO THE AFFECTED AREA TWICE DAILY AS NEEDED  30 g  2  . oxybutynin (DITROPAN-XL) 10 MG 24 hr tablet Take 1 tablet (10 mg total) by mouth daily.  90 tablet  3  . oxymetazoline (AFRIN NASAL SPRAY) 0.05 % nasal spray 1 spray into right nostril if bleeding won't stop.  May repeat once.      . pantoprazole (PROTONIX) 40 MG tablet TAKE 1 TABLET BY MOUTH ONCE DAILY  30 tablet  4  . pentoxifylline (TRENTAL) 400 MG CR tablet Take 1 by mouth three times day      . pentoxifylline (TRENTAL) 400 MG CR tablet TAKE 1 TABLET BY MOUTH THREE TIMES DAILY  270 tablet  3  . potassium chloride (MICRO-K) 10 MEQ CR capsule TAKE ONE CAPSULE BY MOUTH TWICE DAILY  180 capsule  3  . simvastatin (ZOCOR) 80 MG tablet TAKE 1 TABLET BY MOUTH EVERY NIGHT AT BEDTIME  90 tablet  3  . SPIRIVA HANDIHALER 18 MCG inhalation capsule INHALE CONTENTS OF 1 CAPSULE BY MOUTH USING THE HANDIHALER DAILY  30 capsule  5  .  [  DISCONTINUED] oxybutynin (DITROPAN-XL) 10 MG 24 hr tablet Take 1 tablet (10 mg total) by mouth daily.  90 tablet  1   No current facility-administered medications on file prior to visit.      Review of Systems  Constitutional: Positive for fatigue. Negative for fever, activity change and appetite change.  HENT: Negative.   Eyes: Negative.   Respiratory: Positive for shortness of breath and wheezing. Negative for cough and chest tightness.   Cardiovascular: Negative for chest pain and leg swelling.  Gastrointestinal: Negative.   Endocrine: Negative.   Genitourinary: Negative.   Musculoskeletal: Positive for arthralgias, back pain and gait problem.  Skin: Negative.   Allergic/Immunologic: Negative.   Neurological: Positive for light-headedness. Negative for dizziness, seizures, syncope and numbness.  Hematological: Negative.   Psychiatric/Behavioral: Negative.        Objective:   Physical Exam Filed Vitals:   11/06/13 1113  BP: 122/52  Pulse: 60  Temp: 97.5 F (36.4 C)   Gen'l- eldrly woman on O2 in no distress. She is very pale HEENT - C&S clear Cor 2+ radial pulse Pulm - no increased WOB on oxygen, no wheezing Neuro - awake, HOH, answers questions appropriately, slow get up and go, ambulates w/o assistance but slowly      Assessment & Plan:

## 2013-11-06 NOTE — Progress Notes (Signed)
Pre visit review using our clinic review tool, if applicable. No additional management support is needed unless otherwise documented below in the visit note. 

## 2013-11-07 NOTE — Assessment & Plan Note (Signed)
Patient is chronically pale and has had significant anemia in the past.  Plan H/H with recommendations to follow.  Addendum.  Lab Results  Component Value Date   HGB 9.4* 11/06/2013   Patients previous Hgb were in this range. She is not in need of transfusion. Last iron panel May '14 - % sat 18 otherwise in normal range.  Plan - otc iron supplement

## 2013-11-07 NOTE — Assessment & Plan Note (Signed)
Lab Results  Component Value Date   HGBA1C 6.0 11/06/2013   Great control on present regimen - no change. She has had an eye exam - requested that daughter obtain a copy of last visit for our records.

## 2013-11-07 NOTE — Assessment & Plan Note (Signed)
Progressive renal insufficiency. No change in regimen.  Plan-  follow up Bmet in August '15  May need renal consult although given multiple comorbidities she is not a good candidate for HD

## 2013-11-09 ENCOUNTER — Encounter: Payer: Self-pay | Admitting: Internal Medicine

## 2013-11-12 ENCOUNTER — Other Ambulatory Visit: Payer: Self-pay | Admitting: Internal Medicine

## 2013-11-13 ENCOUNTER — Telehealth (HOSPITAL_COMMUNITY): Payer: Self-pay | Admitting: *Deleted

## 2013-11-15 ENCOUNTER — Telehealth (HOSPITAL_COMMUNITY): Payer: Self-pay | Admitting: *Deleted

## 2013-11-19 ENCOUNTER — Other Ambulatory Visit (HOSPITAL_COMMUNITY): Payer: Self-pay | Admitting: Cardiovascular Disease

## 2013-11-19 DIAGNOSIS — I739 Peripheral vascular disease, unspecified: Secondary | ICD-10-CM

## 2013-11-27 ENCOUNTER — Encounter: Payer: Self-pay | Admitting: Cardiovascular Disease

## 2013-11-27 ENCOUNTER — Ambulatory Visit (INDEPENDENT_AMBULATORY_CARE_PROVIDER_SITE_OTHER): Payer: Medicare Other | Admitting: Cardiovascular Disease

## 2013-11-27 VITALS — BP 128/60 | HR 68 | Ht 60.0 in | Wt 153.4 lb

## 2013-11-27 DIAGNOSIS — I2510 Atherosclerotic heart disease of native coronary artery without angina pectoris: Secondary | ICD-10-CM

## 2013-11-27 DIAGNOSIS — I779 Disorder of arteries and arterioles, unspecified: Secondary | ICD-10-CM

## 2013-11-27 DIAGNOSIS — E785 Hyperlipidemia, unspecified: Secondary | ICD-10-CM

## 2013-11-27 DIAGNOSIS — I10 Essential (primary) hypertension: Secondary | ICD-10-CM

## 2013-11-27 DIAGNOSIS — I739 Peripheral vascular disease, unspecified: Secondary | ICD-10-CM

## 2013-11-27 NOTE — Assessment & Plan Note (Signed)
History of coronary artery disease status post multiple  Previous placed stents in the proximal LAD, circumflex, mid and distal dominant RCA.at the time of her last catheterization performed by myself 08/17/07 she did have 50% distal left main. Her last Myoview stress test performed 11/28/12 was nonischemic. The patient denies chest pain.

## 2013-11-27 NOTE — Assessment & Plan Note (Signed)
History of bilateral carotid endarterectomies with redo left carotid endarterectomy. We followed her by duplex ultrasound. She was neurologically asymptomatic

## 2013-11-27 NOTE — Assessment & Plan Note (Signed)
Controlled on current medications 

## 2013-11-27 NOTE — Progress Notes (Signed)
11/27/2013 Megan Mathews   [birth year]  [id number]  Primary Physician Megan Regulus, MD Primary Cardiologist: Megan Gess MD Megan Mathews    HPI:  The patient is a 80 year old, mild to moderately overweight, widowed Caucasian female, mother of 3, grandmother to 2 grandchildren who I last saw in the office 9 months ago. She is accompanied by one of her daughters today. She has a history of CAD and PVOD. I catheterized her in November 2008 revealing a 40% tapered distal left main stenosis but otherwise noncritical CAD. She did have patent stents to her LAD, circumflex and mid and distal RCA with normal LV function. She has known renal artery stenosis right greater than left and has had distal abdominal aorta and bilateral iliac stenting which were patent at the time of her catheterization. She underwent reintervention by myself March 30, 2009. Her last Dopplers performed a year and a half ago showed progressive in-stent restenosis and she does complain of lifestyle-limiting claudication. She has also had bilateral carotid endarterectomies with re-do on the left. We have been following her carotid Dopplers as well. Her other problems include hypertension, hyperlipidemia, insulin-dependent diabetes and GERD. She has home O2 and lives a fairly minimal functional existence, mostly walking around her small apartment. She has complained of progressive nitrate-responsive angina in the last several weeks with an episode that awakened her from sleep recently. Her most recent lab work performed in August by Dr. Debby Mathews revealed total cholesterol 144, LDL 80 and HDL of 43. She saw Megan Mathews back in the office 01/23/13 after being admitted with chest pain in the setting of significant anemia. She did have positive troponins. She had SVT which was converted with intravenous adenosine. She had chronic renal insufficiency at that point with creatinines in the 2 range it was decided not to perform  cardiac catheterization. She has chronic dyspnea on home O2 but denies chest pain.   Current Outpatient Prescriptions  Medication Sig Dispense Refill  . acetaminophen (TYLENOL) 500 MG tablet Take 1,000 mg by mouth every 6 (six) hours as needed for pain.      Marland Kitchen albuterol (PROAIR HFA) 108 (90 BASE) MCG/ACT inhaler Inhale 2 puffs into the lungs every 4 (four) hours as needed (Cough, wheeze, or chest congestion). 2 puffs up to 4 times a day as needed  1 Inhaler  5  . albuterol (PROVENTIL) (2.5 MG/3ML) 0.083% nebulizer solution Take 2.5 mg by nebulization every 6 (six) hours as needed. For shortness of breath      . albuterol (PROVENTIL) (2.5 MG/3ML) 0.083% nebulizer solution USE 1 VIAL IN NEBULIZER EVERY 6 HOURS AS NEEDED  75 mL  5  . ALPRAZolam (XANAX) 0.25 MG tablet TAKE 1 TABLET BY MOUTH TWICE DAILY AS NEEDED  60 tablet  0  . aspirin EC 81 MG EC tablet Take 1 tablet (81 mg total) by mouth daily.      . Calcium Carbonate-Vitamin D (CALCIUM 600+D) 600-400 MG-UNIT per tablet Take 1 tablet by mouth daily.        Marland Kitchen CARTIA XT 180 MG 24 hr capsule TAKE 1 CAPSULE BY MOUTH ONCE DAILY  30 capsule  6  . clobetasol (TEMOVATE) 0.05 % external solution Apply 1 application topically 2 (two) times daily.      . clopidogrel (PLAVIX) 75 MG tablet TAKE 1 TABLET BY MOUTH DAILY  90 tablet  3  . FLUoxetine (PROZAC) 20 MG capsule TAKE ONE CAPSULE BY MOUTH DAILY  30 capsule  0  . furosemide (LASIX) 40 MG tablet Take 1 tablet (40 mg total) by mouth 2 (two) times daily.  60 tablet  9  . glimepiride (AMARYL) 2 MG tablet TAKE 1 TABLET BY MOUTH DAILY  30 tablet  5  . guaifenesin (MUCUS RELIEF) 400 MG TABS Take 400 mg by mouth 2 (two) times daily as needed. For congestion      . isosorbide mononitrate (IMDUR) 30 MG 24 hr tablet Take 3 tablets (90 mg total) by mouth daily.  90 tablet  5  . ketoconazole (NIZORAL) 2 % shampoo Apply 1 application 3 times weekly for 3 weeks. Off for 1 week.      . metFORMIN (GLUCOPHAGE) 1000 MG  tablet TAKE 1 TABLET BY MOUTH TWICE DAILY WITH A MEAL  180 tablet  3  . metoprolol succinate (TOPROL-XL) 50 MG 24 hr tablet Take 1 tablet (50 mg total) by mouth daily. Take with or immediately following a meal.  90 tablet  0  . metoprolol succinate (TOPROL-XL) 50 MG 24 hr tablet TAKE 1 TABLET BY MOUTH DAILY, TAKE WITH OR IMMEDIATELY FOLLOWING A MEAL  90 tablet  0  . Multiple Vitamins-Minerals (MULTIVITAL PO) Take 1 tablet by mouth daily.       . nitroGLYCERIN (NITROLINGUAL) 0.4 MG/SPRAY spray Place 1 spray under the tongue every 5 (five) minutes as needed for chest pain.      Marland Kitchen. nystatin (MYCOSTATIN/NYSTOP) 100000 UNIT/GM POWD APPLY TO THE AFFECTED AREA TWICE DAILY AS NEEDED  30 g  2  . oxybutynin (DITROPAN-XL) 10 MG 24 hr tablet Take 1 tablet (10 mg total) by mouth daily.  90 tablet  3  . oxymetazoline (AFRIN NASAL SPRAY) 0.05 % nasal spray 1 spray into right nostril if bleeding won't stop.  May repeat once.      . pantoprazole (PROTONIX) 40 MG tablet TAKE 1 TABLET BY MOUTH ONCE DAILY  30 tablet  4  . pentoxifylline (TRENTAL) 400 MG CR tablet Take 1 by mouth three times day      . pentoxifylline (TRENTAL) 400 MG CR tablet TAKE 1 TABLET BY MOUTH THREE TIMES DAILY  270 tablet  3  . potassium chloride (MICRO-K) 10 MEQ CR capsule TAKE ONE CAPSULE BY MOUTH TWICE DAILY  180 capsule  3  . simvastatin (ZOCOR) 80 MG tablet TAKE 1 TABLET BY MOUTH EVERY NIGHT AT BEDTIME  90 tablet  3  . SPIRIVA HANDIHALER 18 MCG inhalation capsule INHALE CONTENTS OF 1 CAPSULE BY MOUTH USING THE HANDIHALER DAILY  30 capsule  5  . [DISCONTINUED] oxybutynin (DITROPAN-XL) 10 MG 24 hr tablet Take 1 tablet (10 mg total) by mouth daily.  90 tablet  1   No current facility-administered medications for this visit.    Allergies  Allergen Reactions  . Contrast Media [Iodinated Diagnostic Agents]   . Iohexol Rash and Other (See Comments)    Fever, unable to stand also    History   Social History  . Marital Status: Widowed     Spouse Name: N/A    Number of Children: N/A  . Years of Education: N/A   Occupational History  . Not on file.   Social History Main Topics  . Smoking status: Former Smoker -- 1.00 packs/day for 60 years    Types: Cigarettes    Quit date: 10/08/1997  . Smokeless tobacco: Former NeurosurgeonUser  . Alcohol Use: No  . Drug Use: No  . Sexual Activity: No   Other Topics Concern  .  Not on file   Social History Narrative   Widowed. Daughters are very supportive - which allows her to live alone. End-of-Life care: discussed living will, HCPOA and MOST form. All are provided to the patient for her to consider with the help of her family. Out of Facilty Order and blank MOST form signed. (07/22/10)     Review of Systems: General: negative for chills, fever, night sweats or weight changes.  Cardiovascular: negative for chest pain, dyspnea on exertion, edema, orthopnea, palpitations, paroxysmal nocturnal dyspnea or shortness of breath Dermatological: negative for rash Respiratory: negative for cough or wheezing Urologic: negative for hematuria Abdominal: negative for nausea, vomiting, diarrhea, bright red blood per rectum, melena, or hematemesis Neurologic: negative for visual changes, syncope, or dizziness All other systems reviewed and are otherwise negative except as noted above.    Blood pressure 128/60, pulse 68, height 5' (1.524 m), weight 153 lb 6.4 oz (69.582 kg).  General appearance: alert and no distress Neck: no adenopathy, no JVD, supple, symmetrical, trachea midline, thyroid not enlarged, symmetric, no tenderness/mass/nodules and loud bilateral carotid bruits Lungs: clear to auscultation bilaterally Heart: regular rate and rhythm, S1, S2 normal, no murmur, click, rub or gallop Abdomen: soft, non-tender; bowel sounds normal; no masses,  no organomegaly and 1+ pedal pulses bilaterally, no edema  EKG normal sinus rhythm at 68 with lateral T-wave inversion in leads 1 and L. Unchanged from  prior EKGs  ASSESSMENT AND PLAN:   CAD, multiple PCI/stents. Last cath 12/08- stents patent, 40% LM- med Rx History of coronary artery disease status post multiple  Previous placed stents in the proximal LAD, circumflex, mid and distal dominant RCA.at the time of her last catheterization performed by myself 08/17/07 she did have 50% distal left main. Her last Myoview stress test performed 11/28/12 was nonischemic. The patient denies chest pain.  HTN (hypertension) Controlled on current medications  Hyperlipidemia On statin therapy with her mother's recent lipid profile acceptable for secondary prevention  Carotid artery disease History of bilateral carotid endarterectomies with redo left carotid endarterectomy. We followed her by duplex ultrasound. She was neurologically asymptomatic  PVD, hx of AO stent with PTA for ISR 7/10 History of distal abdominal aortic stenting and bilateral iliac stenting with restenting of her right common iliac artery 03/31/09. We followed this by duplex ultrasound.       Megan Gess MD FACP,FACC,FAHA, [facility] 11/27/2013 10:17 AM

## 2013-11-27 NOTE — Assessment & Plan Note (Signed)
On statin therapy with her mother's recent lipid profile acceptable for secondary prevention

## 2013-11-27 NOTE — Assessment & Plan Note (Signed)
History of distal abdominal aortic stenting and bilateral iliac stenting with restenting of her right common iliac artery 03/31/09. We followed this by duplex ultrasound.

## 2013-11-27 NOTE — Patient Instructions (Signed)
  We will see you back in follow up in 6 months with Wilburt FinlayBryan Hager PA and 1 year with Dr Allyson SabalBerry  Dr Allyson SabalBerry has ordered lower extremity dopplers to be done at the same time as your carotid dopplers

## 2013-12-03 ENCOUNTER — Encounter (HOSPITAL_COMMUNITY): Payer: Medicare Other

## 2013-12-12 ENCOUNTER — Ambulatory Visit (HOSPITAL_COMMUNITY)
Admission: RE | Admit: 2013-12-12 | Discharge: 2013-12-12 | Disposition: A | Discharge status: Home / Self Care | Payer: Medicare Other | Source: Ambulatory Visit | Attending: Cardiovascular Disease | Admitting: Cardiovascular Disease

## 2013-12-12 DIAGNOSIS — I739 Peripheral vascular disease, unspecified: Secondary | ICD-10-CM | POA: Insufficient documentation

## 2013-12-12 NOTE — Progress Notes (Signed)
Carotid Duplex Completed. °Brianna L Mazza,RVT °

## 2013-12-12 NOTE — Progress Notes (Signed)
Lower Extremity Arterial Duplex Completed. °Brianna L Mazza,RVT °

## 2013-12-14 ENCOUNTER — Other Ambulatory Visit: Payer: Self-pay | Admitting: *Deleted

## 2013-12-14 ENCOUNTER — Other Ambulatory Visit: Payer: Self-pay

## 2013-12-14 MED ORDER — FLUOXETINE HCL 20 MG PO CAPS: 20.0000 mg | ORAL_CAPSULE | Freq: Every day | ORAL | Status: DC

## 2013-12-14 NOTE — Telephone Encounter (Signed)
Refill done by Dr. Jonny RuizJohn #30 days only until new PCP assigned.

## 2013-12-17 ENCOUNTER — Telehealth: Payer: Self-pay | Admitting: *Deleted

## 2013-12-17 DIAGNOSIS — I6529 Occlusion and stenosis of unspecified carotid artery: Secondary | ICD-10-CM

## 2013-12-17 NOTE — Telephone Encounter (Signed)
Order placed for repeat carotid dopplers in 6 months  

## 2013-12-17 NOTE — Telephone Encounter (Signed)
Message copied by Marella BileVOGEL, Jennalee Greaves W. on Mon Dec 17, 2013  4:10 PM ------      Message from: Runell GessBERRY, JONATHAN J      Created: Sat Dec 15, 2013  9:49 AM       Minimal changes noted. Repeat in 6 months ------

## 2013-12-20 ENCOUNTER — Telehealth: Payer: Self-pay

## 2013-12-20 NOTE — Telephone Encounter (Signed)
Prior authorization for Pantoprazole 40 mg faxed to patient's mail order pharmacy. Awaiting approval.

## 2013-12-25 ENCOUNTER — Telehealth: Payer: Self-pay | Admitting: *Deleted

## 2013-12-25 MED ORDER — METOPROLOL SUCCINATE ER 50 MG PO TB24: 50.0000 mg | ORAL_TABLET | Freq: Every day | ORAL | Status: DC

## 2013-12-25 NOTE — Telephone Encounter (Signed)
Refill request sent in error.

## 2013-12-25 NOTE — Telephone Encounter (Signed)
Refill request zolpidem 10mg  Last OV 3.3.15 - Dr. Debby BudNorins

## 2013-12-27 NOTE — Telephone Encounter (Signed)
Prior authorization for Pantoprazole 40 mg has been approved through 12/24/2014. Pharmacy notified. Patient notified.

## 2014-01-04 ENCOUNTER — Other Ambulatory Visit: Payer: Self-pay | Admitting: Internal Medicine

## 2014-01-08 ENCOUNTER — Other Ambulatory Visit: Payer: Self-pay

## 2014-01-08 MED ORDER — FLUOXETINE HCL 20 MG PO CAPS: 20.0000 mg | ORAL_CAPSULE | Freq: Every day | ORAL | Status: DC

## 2014-01-09 ENCOUNTER — Ambulatory Visit (INDEPENDENT_AMBULATORY_CARE_PROVIDER_SITE_OTHER): Payer: Medicare Other | Admitting: Cardiovascular Disease

## 2014-01-09 ENCOUNTER — Encounter: Payer: Self-pay | Admitting: Cardiovascular Disease

## 2014-01-09 VITALS — BP 125/56 | HR 62 | Ht 60.0 in | Wt 151.1 lb

## 2014-01-09 DIAGNOSIS — I7389 Other specified peripheral vascular diseases: Secondary | ICD-10-CM

## 2014-01-09 NOTE — Progress Notes (Signed)
I reviewed the patient's carotid and lower extremity are 2 Dopplers of her. She has had some progressive disease in her right and left internal carotid arteries which have been endarterectomized in the past. She is neurologically asymptomatic. She gets occasional claudication in her right leg and does have  A high-grade in-stent restenosis within her right common iliac artery stent as well as progression of disease in her right popliteal artery. She is minimally ambulatory. Given her age and comorbidities as well as minimal at to the level I favor a more conservative approach. I will have her see mid-level provider back in 6 months at me back in one year  Megan GessJonathan J. Pantelis Mathews, M.D., FACP, [facility]ACC, Kathryne ErikssonFAHA, FSCAI [facility] [address] [place], [place]  [id number]  [phone] 01/09/2014 10:09 AM

## 2014-01-09 NOTE — Patient Instructions (Signed)
We request that you follow-up in: 6 months with an extender and in 1 year with Dr Berry  You will receive a reminder letter in the mail two months in advance. If you don't receive a letter, please call our office to schedule the follow-up appointment.   

## 2014-01-10 ENCOUNTER — Other Ambulatory Visit: Payer: Self-pay | Admitting: Pulmonary Disease

## 2014-02-03 ENCOUNTER — Other Ambulatory Visit: Payer: Self-pay | Admitting: Cardiovascular Disease

## 2014-02-04 ENCOUNTER — Other Ambulatory Visit: Payer: Self-pay | Admitting: Cardiovascular Disease

## 2014-02-04 NOTE — Telephone Encounter (Signed)
Rx was sent to pharmacy electronically. 

## 2014-02-04 NOTE — Telephone Encounter (Signed)
Rx refill sent to patient pharmacy   

## 2014-02-18 ENCOUNTER — Other Ambulatory Visit: Payer: Self-pay | Admitting: Cardiovascular Disease

## 2014-02-18 NOTE — Telephone Encounter (Signed)
Rx was sent to pharmacy electronically. 

## 2014-02-20 ENCOUNTER — Other Ambulatory Visit: Payer: Self-pay | Admitting: Pulmonary Disease

## 2014-02-20 ENCOUNTER — Encounter (HOSPITAL_COMMUNITY): Payer: Self-pay | Admitting: Emergency Medicine

## 2014-02-20 ENCOUNTER — Inpatient Hospital Stay (HOSPITAL_COMMUNITY)
Admission: EM | Admit: 2014-02-20 | Discharge: 2014-02-22 | DRG: 065 | Disposition: A | Discharge status: Home / Self Care | Payer: Medicare Other | Attending: Internal Medicine | Admitting: Internal Medicine

## 2014-02-20 DIAGNOSIS — R1013 Epigastric pain: Secondary | ICD-10-CM

## 2014-02-20 DIAGNOSIS — E119 Type 2 diabetes mellitus without complications: Secondary | ICD-10-CM | POA: Diagnosis present

## 2014-02-20 DIAGNOSIS — N189 Chronic kidney disease, unspecified: Secondary | ICD-10-CM

## 2014-02-20 DIAGNOSIS — N184 Chronic kidney disease, stage 4 (severe): Secondary | ICD-10-CM

## 2014-02-20 DIAGNOSIS — F3289 Other specified depressive episodes: Secondary | ICD-10-CM | POA: Diagnosis present

## 2014-02-20 DIAGNOSIS — J449 Chronic obstructive pulmonary disease, unspecified: Secondary | ICD-10-CM

## 2014-02-20 DIAGNOSIS — Z7901 Long term (current) use of anticoagulants: Secondary | ICD-10-CM

## 2014-02-20 DIAGNOSIS — R634 Abnormal weight loss: Secondary | ICD-10-CM

## 2014-02-20 DIAGNOSIS — Z7982 Long term (current) use of aspirin: Secondary | ICD-10-CM

## 2014-02-20 DIAGNOSIS — D631 Anemia in chronic kidney disease: Secondary | ICD-10-CM

## 2014-02-20 DIAGNOSIS — Z91041 Radiographic dye allergy status: Secondary | ICD-10-CM

## 2014-02-20 DIAGNOSIS — I2789 Other specified pulmonary heart diseases: Secondary | ICD-10-CM | POA: Diagnosis present

## 2014-02-20 DIAGNOSIS — R627 Adult failure to thrive: Secondary | ICD-10-CM

## 2014-02-20 DIAGNOSIS — Z87891 Personal history of nicotine dependence: Secondary | ICD-10-CM

## 2014-02-20 DIAGNOSIS — Z9861 Coronary angioplasty status: Secondary | ICD-10-CM

## 2014-02-20 DIAGNOSIS — R630 Anorexia: Secondary | ICD-10-CM

## 2014-02-20 DIAGNOSIS — N039 Chronic nephritic syndrome with unspecified morphologic changes: Secondary | ICD-10-CM

## 2014-02-20 DIAGNOSIS — I2510 Atherosclerotic heart disease of native coronary artery without angina pectoris: Secondary | ICD-10-CM

## 2014-02-20 DIAGNOSIS — E785 Hyperlipidemia, unspecified: Secondary | ICD-10-CM

## 2014-02-20 DIAGNOSIS — I7389 Other specified peripheral vascular diseases: Secondary | ICD-10-CM

## 2014-02-20 DIAGNOSIS — Z888 Allergy status to other drugs, medicaments and biological substances status: Secondary | ICD-10-CM

## 2014-02-20 DIAGNOSIS — I252 Old myocardial infarction: Secondary | ICD-10-CM

## 2014-02-20 DIAGNOSIS — I214 Non-ST elevation (NSTEMI) myocardial infarction: Secondary | ICD-10-CM

## 2014-02-20 DIAGNOSIS — Z7902 Long term (current) use of antithrombotics/antiplatelets: Secondary | ICD-10-CM

## 2014-02-20 DIAGNOSIS — F411 Generalized anxiety disorder: Secondary | ICD-10-CM | POA: Diagnosis present

## 2014-02-20 DIAGNOSIS — Z8249 Family history of ischemic heart disease and other diseases of the circulatory system: Secondary | ICD-10-CM

## 2014-02-20 DIAGNOSIS — I10 Essential (primary) hypertension: Secondary | ICD-10-CM

## 2014-02-20 DIAGNOSIS — I6350 Cerebral infarction due to unspecified occlusion or stenosis of unspecified cerebral artery: Principal | ICD-10-CM | POA: Diagnosis present

## 2014-02-20 DIAGNOSIS — J9611 Chronic respiratory failure with hypoxia: Secondary | ICD-10-CM

## 2014-02-20 DIAGNOSIS — M259 Joint disorder, unspecified: Secondary | ICD-10-CM

## 2014-02-20 DIAGNOSIS — J9610 Chronic respiratory failure, unspecified whether with hypoxia or hypercapnia: Secondary | ICD-10-CM | POA: Diagnosis present

## 2014-02-20 DIAGNOSIS — J4489 Other specified chronic obstructive pulmonary disease: Secondary | ICD-10-CM

## 2014-02-20 DIAGNOSIS — I771 Stricture of artery: Secondary | ICD-10-CM | POA: Diagnosis present

## 2014-02-20 DIAGNOSIS — I639 Cerebral infarction, unspecified: Secondary | ICD-10-CM

## 2014-02-20 DIAGNOSIS — I739 Peripheral vascular disease, unspecified: Secondary | ICD-10-CM

## 2014-02-20 DIAGNOSIS — E1149 Type 2 diabetes mellitus with other diabetic neurological complication: Secondary | ICD-10-CM

## 2014-02-20 DIAGNOSIS — K5900 Constipation, unspecified: Secondary | ICD-10-CM

## 2014-02-20 DIAGNOSIS — G4733 Obstructive sleep apnea (adult) (pediatric): Secondary | ICD-10-CM

## 2014-02-20 DIAGNOSIS — I779 Disorder of arteries and arterioles, unspecified: Secondary | ICD-10-CM

## 2014-02-20 DIAGNOSIS — I6330 Cerebral infarction due to thrombosis of unspecified cerebral artery: Secondary | ICD-10-CM

## 2014-02-20 DIAGNOSIS — F329 Major depressive disorder, single episode, unspecified: Secondary | ICD-10-CM | POA: Diagnosis present

## 2014-02-20 DIAGNOSIS — K3189 Other diseases of stomach and duodenum: Secondary | ICD-10-CM

## 2014-02-20 DIAGNOSIS — D649 Anemia, unspecified: Secondary | ICD-10-CM

## 2014-02-20 DIAGNOSIS — M25519 Pain in unspecified shoulder: Secondary | ICD-10-CM

## 2014-02-20 DIAGNOSIS — I129 Hypertensive chronic kidney disease with stage 1 through stage 4 chronic kidney disease, or unspecified chronic kidney disease: Secondary | ICD-10-CM | POA: Diagnosis present

## 2014-02-20 LAB — CBC
HCT: 26.7 % — ABNORMAL LOW (ref 36.0–46.0)
Hemoglobin: 8.5 g/dL — ABNORMAL LOW (ref 12.0–15.0)
MCH: 28.7 pg (ref 26.0–34.0)
MCHC: 31.8 g/dL (ref 30.0–36.0)
MCV: 90.2 fL (ref 78.0–100.0)
Platelets: 299 10*3/uL (ref 150–400)
RBC: 2.96 MIL/uL — ABNORMAL LOW (ref 3.87–5.11)
RDW: 14 % (ref 11.5–15.5)
WBC: 11.1 10*3/uL — ABNORMAL HIGH (ref 4.0–10.5)

## 2014-02-20 LAB — COMPREHENSIVE METABOLIC PANEL
ALT: 8 U/L (ref 0–35)
AST: 13 U/L (ref 0–37)
Albumin: 3.4 g/dL — ABNORMAL LOW (ref 3.5–5.2)
Alkaline Phosphatase: 55 U/L (ref 39–117)
BUN: 54 mg/dL — ABNORMAL HIGH (ref 6–23)
CO2: 26 mEq/L (ref 19–32)
Calcium: 9.8 mg/dL (ref 8.4–10.5)
Chloride: 104 mEq/L (ref 96–112)
Creatinine, Ser: 2.82 mg/dL — ABNORMAL HIGH (ref 0.50–1.10)
GFR calc Af Amer: 17 mL/min — ABNORMAL LOW (ref >90)
GFR, EST NON AFRICAN AMERICAN: 15 mL/min — AB (ref >90)
Glucose, Bld: 155 mg/dL — ABNORMAL HIGH (ref 70–99)
POTASSIUM: 4.7 meq/L (ref 3.7–5.3)
SODIUM: 144 meq/L (ref 137–147)
Total Protein: 7.3 g/dL (ref 6.0–8.3)

## 2014-02-20 LAB — GLUCOSE, CAPILLARY
GLUCOSE-CAPILLARY: 174 mg/dL — AB (ref 70–99)
Glucose-Capillary: 110 mg/dL — ABNORMAL HIGH (ref 70–99)

## 2014-02-20 LAB — CBC WITH DIFFERENTIAL/PLATELET
BASOS ABS: 0 10*3/uL (ref 0.0–0.1)
Basophils Relative: 0 % (ref 0–1)
Eosinophils Absolute: 0.2 10*3/uL (ref 0.0–0.7)
Eosinophils Relative: 2 % (ref 0–5)
HEMATOCRIT: 25 % — AB (ref 36.0–46.0)
Hemoglobin: 7.9 g/dL — ABNORMAL LOW (ref 12.0–15.0)
LYMPHS PCT: 11 % — AB (ref 12–46)
Lymphs Abs: 1.2 10*3/uL (ref 0.7–4.0)
MCH: 29.8 pg (ref 26.0–34.0)
MCHC: 31.6 g/dL (ref 30.0–36.0)
MCV: 94.3 fL (ref 78.0–100.0)
Monocytes Absolute: 0.7 10*3/uL (ref 0.1–1.0)
Monocytes Relative: 6 % (ref 3–12)
NEUTROS ABS: 8.9 10*3/uL — AB (ref 1.7–7.7)
Neutrophils Relative %: 81 % — ABNORMAL HIGH (ref 43–77)
PLATELETS: 330 10*3/uL (ref 150–400)
RBC: 2.65 MIL/uL — ABNORMAL LOW (ref 3.87–5.11)
RDW: 12.9 % (ref 11.5–15.5)
WBC: 11 10*3/uL — AB (ref 4.0–10.5)

## 2014-02-20 LAB — POC OCCULT BLOOD, ED: Fecal Occult Bld: NEGATIVE

## 2014-02-20 LAB — TSH: TSH: 1.29 u[IU]/mL (ref 0.350–4.500)

## 2014-02-20 LAB — PREPARE RBC (CROSSMATCH)

## 2014-02-20 MED ORDER — HYDRALAZINE HCL 20 MG/ML IJ SOLN
10.0000 mg | INTRAMUSCULAR | Status: DC | PRN
  Administered 2014-02-20: 10 mg via INTRAVENOUS
  Filled 2014-02-20: qty 1

## 2014-02-20 MED ORDER — INSULIN ASPART 100 UNIT/ML ~~LOC~~ SOLN
0.0000 [IU] | Freq: Three times a day (TID) | SUBCUTANEOUS | Status: DC
  Administered 2014-02-21: 1 [IU] via SUBCUTANEOUS
  Administered 2014-02-21: 5 [IU] via SUBCUTANEOUS
  Administered 2014-02-21: 2 [IU] via SUBCUTANEOUS
  Administered 2014-02-22: 5 [IU] via SUBCUTANEOUS
  Administered 2014-02-22: 2 [IU] via SUBCUTANEOUS

## 2014-02-20 MED ORDER — POTASSIUM CHLORIDE CRYS ER 20 MEQ PO TBCR
20.0000 meq | EXTENDED_RELEASE_TABLET | Freq: Two times a day (BID) | ORAL | Status: DC
  Administered 2014-02-20 – 2014-02-22 (×4): 20 meq via ORAL
  Filled 2014-02-20 (×5): qty 1

## 2014-02-20 MED ORDER — OXYBUTYNIN CHLORIDE ER 10 MG PO TB24
10.0000 mg | ORAL_TABLET | Freq: Every day | ORAL | Status: DC
  Administered 2014-02-21 – 2014-02-22 (×2): 10 mg via ORAL
  Filled 2014-02-20 (×2): qty 1

## 2014-02-20 MED ORDER — DOCUSATE SODIUM 100 MG PO CAPS
100.0000 mg | ORAL_CAPSULE | Freq: Two times a day (BID) | ORAL | Status: DC
  Administered 2014-02-20 – 2014-02-22 (×4): 100 mg via ORAL
  Filled 2014-02-20 (×5): qty 1

## 2014-02-20 MED ORDER — CLOBETASOL PROPIONATE 0.05 % EX SOLN: 1.0000 "application " | Freq: Two times a day (BID) | CUTANEOUS | Status: DC

## 2014-02-20 MED ORDER — NITROGLYCERIN 0.4 MG/SPRAY TL SOLN
1.0000 | Status: DC | PRN
  Filled 2014-02-20: qty 4.9

## 2014-02-20 MED ORDER — OXYMETAZOLINE HCL 0.05 % NA SOLN: 1.0000 | Freq: Two times a day (BID) | NASAL | Status: DC | PRN

## 2014-02-20 MED ORDER — NYSTATIN PO POWD: 1.0000 "application " | Freq: Two times a day (BID) | ORAL | Status: DC | PRN

## 2014-02-20 MED ORDER — FLUOXETINE HCL 20 MG PO CAPS
20.0000 mg | ORAL_CAPSULE | Freq: Every day | ORAL | Status: DC
  Administered 2014-02-21 – 2014-02-22 (×2): 20 mg via ORAL
  Filled 2014-02-20 (×2): qty 1

## 2014-02-20 MED ORDER — NYSTATIN 100000 UNIT/GM EX POWD
Freq: Two times a day (BID) | CUTANEOUS | Status: DC | PRN
  Filled 2014-02-20: qty 15

## 2014-02-20 MED ORDER — TIOTROPIUM BROMIDE MONOHYDRATE 18 MCG IN CAPS
18.0000 ug | ORAL_CAPSULE | Freq: Every day | RESPIRATORY_TRACT | Status: DC
  Administered 2014-02-21 – 2014-02-22 (×2): 18 ug via RESPIRATORY_TRACT
  Filled 2014-02-20: qty 5

## 2014-02-20 MED ORDER — PENTOXIFYLLINE ER 400 MG PO TBCR
400.0000 mg | EXTENDED_RELEASE_TABLET | Freq: Three times a day (TID) | ORAL | Status: DC
  Administered 2014-02-21 – 2014-02-22 (×5): 400 mg via ORAL
  Filled 2014-02-20 (×7): qty 1

## 2014-02-20 MED ORDER — ATORVASTATIN CALCIUM 40 MG PO TABS
40.0000 mg | ORAL_TABLET | Freq: Every day | ORAL | Status: DC
  Administered 2014-02-20 – 2014-02-21 (×2): 40 mg via ORAL
  Filled 2014-02-20 (×3): qty 1

## 2014-02-20 MED ORDER — SENNA 8.6 MG PO TABS
1.0000 | ORAL_TABLET | Freq: Two times a day (BID) | ORAL | Status: DC
  Administered 2014-02-20 – 2014-02-22 (×4): 8.6 mg via ORAL
  Filled 2014-02-20 (×5): qty 1

## 2014-02-20 MED ORDER — ISOSORBIDE MONONITRATE ER 60 MG PO TB24
90.0000 mg | ORAL_TABLET | Freq: Every day | ORAL | Status: DC
  Administered 2014-02-21 – 2014-02-22 (×2): 90 mg via ORAL
  Filled 2014-02-20 (×2): qty 1

## 2014-02-20 MED ORDER — ACETAMINOPHEN 500 MG PO TABS: 1000.0000 mg | ORAL_TABLET | Freq: Four times a day (QID) | ORAL | Status: DC | PRN

## 2014-02-20 MED ORDER — METOPROLOL SUCCINATE ER 50 MG PO TB24
50.0000 mg | ORAL_TABLET | Freq: Every day | ORAL | Status: DC
  Administered 2014-02-21 – 2014-02-22 (×2): 50 mg via ORAL
  Filled 2014-02-20 (×2): qty 1

## 2014-02-20 MED ORDER — SODIUM CHLORIDE 0.9 % IV BOLUS (SEPSIS)
500.0000 mL | Freq: Once | INTRAVENOUS | Status: AC
  Administered 2014-02-20: 500 mL via INTRAVENOUS

## 2014-02-20 MED ORDER — DILTIAZEM HCL ER COATED BEADS 180 MG PO CP24
180.0000 mg | ORAL_CAPSULE | Freq: Every day | ORAL | Status: DC
  Administered 2014-02-21 – 2014-02-22 (×2): 180 mg via ORAL
  Filled 2014-02-20 (×2): qty 1

## 2014-02-20 MED ORDER — ASPIRIN EC 81 MG PO TBEC
81.0000 mg | DELAYED_RELEASE_TABLET | Freq: Every day | ORAL | Status: DC
  Administered 2014-02-21 – 2014-02-22 (×2): 81 mg via ORAL
  Filled 2014-02-20 (×2): qty 1

## 2014-02-20 MED ORDER — TIOTROPIUM BROMIDE MONOHYDRATE 18 MCG IN CAPS: 18.0000 ug | ORAL_CAPSULE | Freq: Every day | RESPIRATORY_TRACT | Status: DC

## 2014-02-20 MED ORDER — ENOXAPARIN SODIUM 30 MG/0.3ML ~~LOC~~ SOLN
30.0000 mg | SUBCUTANEOUS | Status: DC
  Administered 2014-02-20 – 2014-02-21 (×2): 30 mg via SUBCUTANEOUS
  Filled 2014-02-20 (×3): qty 0.3

## 2014-02-20 MED ORDER — FUROSEMIDE 40 MG PO TABS
40.0000 mg | ORAL_TABLET | Freq: Two times a day (BID) | ORAL | Status: DC
  Administered 2014-02-20 – 2014-02-22 (×4): 40 mg via ORAL
  Filled 2014-02-20 (×6): qty 1

## 2014-02-20 MED ORDER — PANTOPRAZOLE SODIUM 40 MG PO TBEC
40.0000 mg | DELAYED_RELEASE_TABLET | Freq: Every day | ORAL | Status: DC
  Administered 2014-02-21 – 2014-02-22 (×2): 40 mg via ORAL
  Filled 2014-02-20: qty 1

## 2014-02-20 MED ORDER — ONDANSETRON HCL 4 MG PO TABS: 4.0000 mg | ORAL_TABLET | Freq: Four times a day (QID) | ORAL | Status: DC | PRN

## 2014-02-20 MED ORDER — ONDANSETRON HCL 4 MG/2ML IJ SOLN: 4.0000 mg | Freq: Four times a day (QID) | INTRAMUSCULAR | Status: DC | PRN

## 2014-02-20 MED ORDER — SODIUM CHLORIDE 0.9 % IJ SOLN
3.0000 mL | Freq: Two times a day (BID) | INTRAMUSCULAR | Status: DC
  Administered 2014-02-20 – 2014-02-22 (×4): 3 mL via INTRAVENOUS

## 2014-02-20 MED ORDER — CLOPIDOGREL BISULFATE 75 MG PO TABS
75.0000 mg | ORAL_TABLET | Freq: Every day | ORAL | Status: DC
  Administered 2014-02-21 – 2014-02-22 (×2): 75 mg via ORAL
  Filled 2014-02-20 (×3): qty 1

## 2014-02-20 NOTE — ED Notes (Addendum)
Pt reports dizziness, weakness, confusion, feels like she is going to pass out. Been going on for May 21st off and on. States that day her BP went really high to 200/100. States since then she has had numbness in face. Used to walk by herself and now uses walker. Has issues with anemia. Denies actually passing out. Denies chest pain or trouble breathing.

## 2014-02-20 NOTE — Progress Notes (Signed)
Pt's BP elevated around 190's systolic. Short MD paged. Currently awaiting orders.

## 2014-02-20 NOTE — Progress Notes (Signed)
Report called and received from ED. 

## 2014-02-20 NOTE — ED Provider Notes (Signed)
CSN: [id number]     Arrival date & time 02/20/14  1154 History   First MD Initiated Contact with Patient 02/20/14 1320     Chief Complaint  Patient presents with  . Weakness     (Consider location/radiation/quality/duration/timing/severity/associated sxs/prior Treatment) HPI 80 year old female presents with weakness over the last month. It started after she had severe blood pressure elevation up to 250/100 on May 21. Since then she's been having intermittent weakness, dizziness, and fatigue. Her daughter relates that this fatigue and her other symptoms are very similar to when she had anemia requiring a blood transfusion last year. She is a little more pale than normal as well. She's not had any blood in her stools or melena. No chest pain, shortness of breath, or headaches. She's also felt intermittent numbness around her face over her mouth and nose. This is bilateral. It comes and goes and it is not there currently.  Past Medical History  Diagnosis Date  . COPD (chronic obstructive pulmonary disease)     home 02  . OSA (obstructive sleep apnea)   . Hypoxemia   . Secondary pulmonary hypertension 8/09    "severe" by echo  . CAD (coronary artery disease) '95,'96,'98.'99,'08    Multiple PCI/stenting all 3 vessels  . Diabetes mellitus type II   . Hyperlipidemia   . Hypertension   . Peripheral vascular disease '96, July 2010    AoIliac stenting with PTA for ISR 7/10  . Tobacco abuse     quit '99  . Constipation   . Anxiety   . Depression   . Artery stenosis     renal   bilateral  . Gastritis 04/2011    mild gastritis on EGD  . NSTEMI (non-ST elevated myocardial infarction) 01/02/13    in setting of PSVT, +Trop, no ECG change, no cath  . Carotid artery disease     bilateral carotid endarterectomies with redo on the left   Past Surgical History  Procedure Laterality Date  . Carotid endarterectomy      bilat with redo on Lt  . Total abdominal hysterectomy    . Aaa stent  10/96   . Bilateral iliac stenting  10/96, 7/10  . Resection of vocal chord lesions    . Excision serous cyst adenofibroma    . Coronary angioplasty  2/95    RCA  . Coronary angioplasty with stent placement  8/96    RCA  . Coronary angioplasty with stent placement  5/98    RCA X 2  . Coronary angioplasty with stent placement  11/99    CFX,LAD,Dx  . Coronary angiogram  12/08    patent stents, 40% LM  . Thoracotomy Left 1960s    removal of "glandular tumor" (pt's words)   History reviewed. No pertinent family history. History  Substance Use Topics  . Smoking status: Former Smoker -- 1.00 packs/day for 60 years    Types: Cigarettes    Quit date: 10/08/1997  . Smokeless tobacco: Former NeurosurgeonUser  . Alcohol Use: No   OB History   Grav Para Term Preterm Abortions TAB SAB Ect Mult Living                 Review of Systems  Constitutional: Positive for fatigue.  Respiratory: Negative for shortness of breath.   Cardiovascular: Negative for chest pain.  Gastrointestinal: Negative for vomiting, abdominal pain, diarrhea and blood in stool.  Genitourinary: Negative for dysuria.  Neurological: Positive for weakness, light-headedness and numbness. Negative for  headaches.  All other systems reviewed and are negative.     Allergies  Contrast media and Iohexol  Home Medications   Prior to Admission medications   Medication Sig Start Date End Date Taking? Authorizing Provider  acetaminophen (TYLENOL) 500 MG tablet Take 1,000 mg by mouth every 6 (six) hours as needed for pain.    Historical Provider, MD  albuterol (PROAIR HFA) 108 (90 BASE) MCG/ACT inhaler Inhale 2 puffs into the lungs every 4 (four) hours as needed (Cough, wheeze, or chest congestion). 2 puffs up to 4 times a day as needed 11/30/11   Coralyn HellingVineet Sood, MD  albuterol (PROVENTIL) (2.5 MG/3ML) 0.083% nebulizer solution Take 2.5 mg by nebulization every 6 (six) hours as needed. For shortness of breath    Historical Provider, MD  albuterol  (PROVENTIL) (2.5 MG/3ML) 0.083% nebulizer solution USE 1 VIAL IN NEBULIZER EVERY 6 HOURS AS NEEDED 03/19/13   Coralyn HellingVineet Sood, MD  ALPRAZolam (XANAX) 0.25 MG tablet TAKE 1 TABLET BY MOUTH TWICE DAILY AS NEEDED    Jacques NavyMichael E Norins, MD  aspirin EC 81 MG EC tablet Take 1 tablet (81 mg total) by mouth daily. 01/11/13   Brittainy Simmons, PA-C  Calcium Carbonate-Vitamin D (CALCIUM 600+D) 600-400 MG-UNIT per tablet Take 1 tablet by mouth daily.      Historical Provider, MD  CARTIA XT 180 MG 24 hr capsule TAKE 1 CAPSULE BY MOUTH EVERY DAY    Runell GessJonathan J Berry, MD  clobetasol (TEMOVATE) 0.05 % external solution Apply 1 application topically 2 (two) times daily. 11/06/13   Historical Provider, MD  clopidogrel (PLAVIX) 75 MG tablet TAKE 1 TABLET BY MOUTH DAILY 07/09/13   Jacques NavyMichael E Norins, MD  FLUoxetine (PROZAC) 20 MG capsule Take 1 capsule (20 mg total) by mouth daily. 01/08/14   Corwin LevinsJames W John, MD  furosemide (LASIX) 40 MG tablet TAKE 1 TABLET BY MOUTH TWICE DAILY 02/04/14   Runell GessJonathan J Berry, MD  glimepiride (AMARYL) 2 MG tablet TAKE 1 TABLET BY MOUTH DAILY 11/12/13   Jacques NavyMichael E Norins, MD  guaifenesin (MUCUS RELIEF) 400 MG TABS Take 400 mg by mouth 2 (two) times daily as needed. For congestion    Historical Provider, MD  isosorbide mononitrate (IMDUR) 30 MG 24 hr tablet Take 3 tablets (90 mg total) by mouth daily. 01/11/13   Brittainy Simmons, PA-C  isosorbide mononitrate (IMDUR) 60 MG 24 hr tablet TAKE 1& 1/2 TABLETS BY MOUTH DAILY    Runell GessJonathan J Berry, MD  ketoconazole (NIZORAL) 2 % shampoo Apply 1 application 3 times weekly for 3 weeks. Off for 1 week. 09/17/13   Historical Provider, MD  metFORMIN (GLUCOPHAGE) 1000 MG tablet TAKE 1 TABLET BY MOUTH TWICE DAILY WITH A MEAL 10/01/13   Jacques NavyMichael E Norins, MD  metoprolol succinate (TOPROL-XL) 50 MG 24 hr tablet TAKE 1 TABLET BY MOUTH DAILY, TAKE WITH OR IMMEDIATELY FOLLOWING A MEAL 06/25/13   Jacques NavyMichael E Norins, MD  metoprolol succinate (TOPROL-XL) 50 MG 24 hr tablet Take 1 tablet (50 mg  total) by mouth daily. Take with or immediately following a meal. 12/25/13   Corwin LevinsJames W John, MD  Multiple Vitamins-Minerals (MULTIVITAL PO) Take 1 tablet by mouth daily.     Historical Provider, MD  nitroGLYCERIN (NITROLINGUAL) 0.4 MG/SPRAY spray Place 1 spray under the tongue every 5 (five) minutes as needed for chest pain.    Historical Provider, MD  nystatin (MYCOSTATIN/NYSTOP) 100000 UNIT/GM POWD APPLY TO THE AFFECTED AREA TWICE DAILY AS NEEDED    Rosalyn GessMichael E Norins,  MD  oxybutynin (DITROPAN-XL) 10 MG 24 hr tablet Take 1 tablet (10 mg total) by mouth daily. 04/09/13   Jacques Navy, MD  oxymetazoline (AFRIN NASAL SPRAY) 0.05 % nasal spray 1 spray into right nostril if bleeding won't stop.  May repeat once. 04/24/12   Lollie Sails, MD  pantoprazole (PROTONIX) 40 MG tablet TAKE 1 TABLET BY MOUTH DAILY 02/18/14   Runell Gess, MD  pentoxifylline (TRENTAL) 400 MG CR tablet Take 1 by mouth three times day 11/01/12   Historical Provider, MD  pentoxifylline (TRENTAL) 400 MG CR tablet TAKE 1 TABLET BY MOUTH THREE TIMES DAILY 09/17/13   Jacques Navy, MD  potassium chloride (MICRO-K) 10 MEQ CR capsule TAKE ONE CAPSULE BY MOUTH TWICE DAILY 09/10/13   Jacques Navy, MD  simvastatin (ZOCOR) 80 MG tablet TAKE 1 TABLET BY MOUTH EVERY NIGHT AT BEDTIME 07/24/13   Jacques Navy, MD  SPIRIVA HANDIHALER 18 MCG inhalation capsule INHALE CONTENTS OF 1 CAPSULE BY MOUTH USING THE HANDIHALER DAILY 01/10/14   Coralyn Helling, MD   BP 123/44  Pulse 63  Temp(Src) 98.3 F (36.8 C) (Oral)  Resp 18  Ht 5' (1.524 m)  Wt 140 lb (63.504 kg)  BMI 27.34 kg/m2  SpO2 99% Physical Exam  Nursing note and vitals reviewed. Constitutional: She is oriented to person, place, and time. She appears well-developed and well-nourished.  HENT:  Head: Normocephalic and atraumatic.  Right Ear: External ear normal.  Left Ear: External ear normal.  Nose: Nose normal.  Eyes: Right eye exhibits no discharge. Left eye exhibits no  discharge.  Cardiovascular: Normal rate, regular rhythm and normal heart sounds.   Pulmonary/Chest: Effort normal and breath sounds normal.  Abdominal: Soft. There is no tenderness.  Genitourinary: Rectal exam shows no external hemorrhoid, no internal hemorrhoid and no tenderness. Guaiac negative stool.  Neurological: She is alert and oriented to person, place, and time.  CN 2-12 grossly intact. Normal gross sensation to face bilaterally. Equal strength in all 4 extremities.  Skin: Skin is dry. There is pallor.    ED Course  Procedures (including critical care time) Labs Review Labs Reviewed  CBC WITH DIFFERENTIAL - Abnormal; Notable for the following:    WBC 11.0 (*)    RBC 2.65 (*)    Hemoglobin 7.9 (*)    HCT 25.0 (*)    Neutrophils Relative % 81 (*)    Neutro Abs 8.9 (*)    Lymphocytes Relative 11 (*)    All other components within normal limits  COMPREHENSIVE METABOLIC PANEL - Abnormal; Notable for the following:    Glucose, Bld 155 (*)    BUN 54 (*)    Creatinine, Ser 2.82 (*)    Albumin 3.4 (*)    Total Bilirubin <0.2 (*)    GFR calc non Af Amer 15 (*)    GFR calc Af Amer 17 (*)    All other components within normal limits  POC OCCULT BLOOD, ED  TYPE AND SCREEN  PREPARE RBC (CROSSMATCH)    Imaging Review No results found.   EKG Interpretation   Date/Time:  Wednesday Willadean 17 2015 11:59:20 EDT Ventricular Rate:  61 PR Interval:  192 QRS Duration: 84 QT Interval:  420 QTC Calculation: 422 R Axis:   7 Text Interpretation:  Normal sinus rhythm Minimal voltage criteria for  LVH, may be normal variant Nonspecific T wave abnormality Abnormal ECG No  significant change since last tracing Confirmed by GOLDSTON  MD, SCOTT  (  [id number]) on 02/20/2014 1:21:19 PM      MDM   Final diagnoses:  Anemia    Patient with symptomatic anemia. On chart review when her hemoglobin is at a similar level (7.5) she also required a blood transfusion and had resolution of symptoms.  There's no obvious source of why she is anemic. A rectal exam shows no obvious blood. At this time she is stable, will admit to the hospitalist for blood transfusion.    Audree Camel, MD 02/20/14 [phone]

## 2014-02-20 NOTE — Progress Notes (Signed)
Megan Mathews [id number] Code Status: Full   Admission Data: 02/20/2014 5:13 PM Attending Provider:  Short WUJ:[id number]:Michael Norins, MD Consults/ Treatment Team:    Ananda L Julien GirtMcDaniel is a 80 y.o. female patient admitted from ED awake, alert - oriented  X 3 - no acute distress noted.  VSS - Blood pressure 156/62, pulse 68, temperature 98.4 F (36.9 C), temperature source Oral, resp. rate 18, height 5' (1.524 m), weight 63.504 kg (140 lb), SpO2 100.00%.    IV in place, occlusive dsg intact without redness.  Orientation to room, and floor completed with information packet given to patient/family.  Patient safety video not working at this time.  Admission INP armband ID verified with patient/family, and in place.   SR up x 2, fall assessment complete, with patient and family able to verbalize understanding of risk associated with falls, and verbalized understanding to call nsg before up out of bed.  Call light within reach, patient able to voice, and demonstrate understanding.    Will cont to eval and treat per MD orders.  Al DecantFlores, Letitia Sabala F, [place]RN 02/20/2014 5:13 PM

## 2014-02-20 NOTE — H&P (Signed)
[facility] History and Physical  Megan Mathews ZOX:[id number] DOB: 07/23/1932 DOA: 02/20/2014  Referring physician:  Pricilla Loveless PCP:  Illene Regulus, MD   Chief Complaint:  Fatigue, off-balance  HPI:  The patient is a 81 y.o. year-old female with history of COPD, OSA, CAD s/p multiple PCI with stenting to 3 vessels, last NSTEMI in 12/2012 without follow up catheterization secondary to progressive CKD, CKD stage IV, T2DM, PVD, depression/anxiety who presents with progressive fatigue.  The patient was last at their baseline health about two months ago.  Since that time, she has had progressive weakness, dyspnea on exertion where she can only walk across approximately one room before she becomes winded. She has had some cold intolerance. She states she feels lightheaded and occasionally like the room is spinning when she stands up and like she is going to pass out. She denies loss of consciousness or recent falls. She states she felt similar to this previously when she was anemic. She denies any chest pressure or palpitations, and her symptoms have been mild and subacute to chronic as opposed to when she felt similar symptoms last year during her heart attack. She denies focal weakness, numbness, slurred speech, or confusion.  In the emergency department, her vital signs were stable. Her white blood cell count is mildly elevated but stable from prior, hemoglobin down to 7.9, her baseline appears to be around 9-10. Her BMP was notable for BUN of 54 and creatinine of 2.82, slightly increased from 2014, but similar to when she had routine labs done in March of this year. The emergency department physician ordered her 1 unit of blood, and she is being admitted for further evaluation of her anemia, fatigue, and dizziness.  Review of Systems:  General:  Denies fevers, positive chills, no recent weight loss or gain HEENT:  Denies changes to hearing and vision, rhinorrhea, sinus congestion, sore  throat CV:  Denies chest pain and palpitations, lower extremity edema.  PULM:  Denies SOB, wheezing, cough.   GI:  Denies nausea, vomiting, constipation, diarrhea.   GU:  Denies dysuria, frequency, urgency ENDO:  Denies polyuria, polydipsia.   HEME:  Denies hematemesis, blood in stools, melena, abnormal bruising or bleeding.  LYMPH:  Denies lymphadenopathy.   MSK:  Denies arthralgias, myalgias.   DERM:  Denies skin rash or ulcer.   NEURO:  Denies focal numbness, weakness, slurred speech, confusion, facial droop.  PSYCH:  Denies anxiety and depression.    Past Medical History  Diagnosis Date  . COPD (chronic obstructive pulmonary disease)     home 02  . OSA (obstructive sleep apnea)   . Hypoxemia   . Secondary pulmonary hypertension 8/09    "severe" by echo  . CAD (coronary artery disease) '95,'96,'98.'99,'08    Multiple PCI/stenting all 3 vessels  . Diabetes mellitus type II   . Hyperlipidemia   . Hypertension   . Peripheral vascular disease '96, July 2010    AoIliac stenting with PTA for ISR 7/10  . Tobacco abuse     quit '99  . Constipation   . Anxiety   . Depression   . Artery stenosis     renal   bilateral  . Gastritis 04/2011    mild gastritis on EGD  . NSTEMI (non-ST elevated myocardial infarction) 01/02/13    in setting of PSVT, +Trop, no ECG change, no cath  . Carotid artery disease     bilateral carotid endarterectomies with redo on the left   Past Surgical  History  Procedure Laterality Date  . Carotid endarterectomy      bilat with redo on Lt  . Total abdominal hysterectomy    . Aaa stent  10/96  . Bilateral iliac stenting  10/96, 7/10  . Resection of vocal chord lesions    . Excision serous cyst adenofibroma    . Coronary angioplasty  2/95    RCA  . Coronary angioplasty with stent placement  8/96    RCA  . Coronary angioplasty with stent placement  5/98    RCA X 2  . Coronary angioplasty with stent placement  11/99    CFX,LAD,Dx  . Coronary  angiogram  12/08    patent stents, 40% LM  . Thoracotomy Left 1960s    removal of "glandular tumor" (pt's words)   Social History:  reports that she quit smoking about 16 years ago. Her smoking use included Cigarettes. She has a 60 pack-year smoking history. She has quit using smokeless tobacco. She reports that she does not drink alcohol or use illicit drugs.  Allergies  Allergen Reactions  . Contrast Media [Iodinated Diagnostic Agents]   . Iohexol Rash and Other (See Comments)    Fever, unable to stand also    History reviewed. No pertinent family history.   Prior to Admission medications   Medication Sig Start Date End Date Taking? Authorizing Provider  acetaminophen (TYLENOL) 500 MG tablet Take 1,000 mg by mouth every 6 (six) hours as needed for mild pain, fever or headache.    Yes Historical Provider, MD  albuterol (PROAIR HFA) 108 (90 BASE) MCG/ACT inhaler Inhale 2 puffs into the lungs every 4 (four) hours as needed (Cough, wheeze, or chest congestion). 2 puffs up to 4 times a day as needed 11/30/11  Yes Coralyn Helling, MD  albuterol (PROVENTIL) (2.5 MG/3ML) 0.083% nebulizer solution Take 2.5 mg by nebulization every 6 (six) hours as needed. For shortness of breath   Yes Historical Provider, MD  aspirin EC 81 MG EC tablet Take 1 tablet (81 mg total) by mouth daily. 01/11/13  Yes Brittainy Simmons, PA-C  clobetasol (TEMOVATE) 0.05 % external solution Apply 1 application topically 2 (two) times daily. 11/06/13  Yes Historical Provider, MD  clopidogrel (PLAVIX) 75 MG tablet Take 75 mg by mouth daily with breakfast.   Yes Historical Provider, MD  diltiazem (CARTIA XT) 180 MG 24 hr capsule Take 180 mg by mouth daily.   Yes Historical Provider, MD  FLUoxetine (PROZAC) 20 MG capsule Take 20 mg by mouth daily. 01/08/14  Yes Corwin Levins, MD  furosemide (LASIX) 40 MG tablet Take 40 mg by mouth 2 (two) times daily.   Yes Historical Provider, MD  glimepiride (AMARYL) 2 MG tablet TAKE 1 TABLET BY MOUTH  DAILY 11/12/13  Yes Jacques Navy, MD  guaifenesin (MUCUS RELIEF) 400 MG TABS Take 400 mg by mouth 2 (two) times daily as needed. For congestion   Yes Historical Provider, MD  isosorbide mononitrate (IMDUR) 30 MG 24 hr tablet Take 30 mg by mouth daily. 01/11/13  Yes Brittainy Sharol Harness, PA-C  isosorbide mononitrate (IMDUR) 60 MG 24 hr tablet Take 60 mg by mouth daily.   Yes Historical Provider, MD  metFORMIN (GLUCOPHAGE) 1000 MG tablet TAKE 1 TABLET BY MOUTH TWICE DAILY WITH A MEAL 10/01/13  Yes Jacques Navy, MD  metoprolol succinate (TOPROL-XL) 50 MG 24 hr tablet Take 50 mg by mouth daily. Take with or immediately following a meal.   Yes Historical Provider, MD  Multiple Vitamins-Minerals (MULTIVITAL PO) Take 1 tablet by mouth daily.    Yes Historical Provider, MD  nitroGLYCERIN (NITROLINGUAL) 0.4 MG/SPRAY spray Place 1 spray under the tongue every 5 (five) minutes x 3 doses as needed for chest pain.   Yes Historical Provider, MD  Nystatin POWD Take 1 application by mouth 2 (two) times daily as needed (for rash).   Yes Historical Provider, MD  oxybutynin (DITROPAN-XL) 10 MG 24 hr tablet Take 1 tablet (10 mg total) by mouth daily. 04/09/13  Yes Jacques NavyMichael E Norins, MD  oxymetazoline (AFRIN) 0.05 % nasal spray Place 1 spray into both nostrils 2 (two) times daily as needed (for nose bleeds). 04/24/12  Yes Lollie SailsAndrew B Wallace, MD  pantoprazole (PROTONIX) 40 MG tablet Take 40 mg by mouth daily.   Yes Historical Provider, MD  pentoxifylline (TRENTAL) 400 MG CR tablet Take 1 by mouth three times day 11/01/12  Yes Historical Provider, MD  potassium chloride (MICRO-K) 10 MEQ CR capsule Take 20 mEq by mouth 2 (two) times daily.   Yes Historical Provider, MD  simvastatin (ZOCOR) 80 MG tablet Take 80 mg by mouth daily.   Yes Historical Provider, MD  tiotropium (SPIRIVA) 18 MCG inhalation capsule Place 18 mcg into inhaler and inhale daily.   Yes Historical Provider, MD   Physical Exam: Filed Vitals:   02/20/14 1518  02/20/14 1528 02/20/14 1543 02/20/14 1631  BP: 168/60 146/53 160/72 156/62  Pulse: 65 66 67 68  Temp: 98 F (36.7 C) 97.9 F (36.6 C) 98 F (36.7 C) 98.4 F (36.9 C)  TempSrc: Oral  Oral Oral  Resp: 15 16 16 18   Height:      Weight:      SpO2: 100% 99% 100% 100%     General:  Caucasian female, no acute distress  Eyes:  PERRL, anicteric, non-injected.  ENT:  Nares clear.  OP clear, non-erythematous without plaques or exudates.  MMM.  Neck:  Supple without TM or JVD.    Lymph:  No cervical, supraclavicular, or submandibular LAD.  Cardiovascular:  RRR, normal S1, S2, 2/6 systolic murmur at the left sternal border and apex, no rubs or gallops.  2+ pulses, warm extremities  Respiratory:  CTA bilaterally without increased WOB.  Abdomen:  NABS.  Soft, ND/NT.    Skin:  No rashes or focal lesions.  Musculoskeletal:  Normal bulk and tone.  No LE edema.  Psychiatric:  A & O x 4.  Appropriate affect.  Neurologic:  CN 3-12 intact.  5/5 strength. Very strong for age.  Sensation intact.  Labs on Admission:  Basic Metabolic Panel:  Recent Labs Lab 02/20/14 1332  NA 144  K 4.7  CL 104  CO2 26  GLUCOSE 155*  BUN 54*  CREATININE 2.82*  CALCIUM 9.8   Liver Function Tests:  Recent Labs Lab 02/20/14 1332  AST 13  ALT 8  ALKPHOS 55  BILITOT <0.2*  PROT 7.3  ALBUMIN 3.4*   No results found for this basename: LIPASE, AMYLASE,  in the last 168 hours No results found for this basename: AMMONIA,  in the last 168 hours CBC:  Recent Labs Lab 02/20/14 1332  WBC 11.0*  NEUTROABS 8.9*  HGB 7.9*  HCT 25.0*  MCV 94.3  PLT 330   Cardiac Enzymes: No results found for this basename: CKTOTAL, CKMB, CKMBINDEX, TROPONINI,  in the last 168 hours  BNP (last 3 results) No results found for this basename: PROBNP,  in the last 8760 hours CBG: No  results found for this basename: GLUCAP,  in the last 168 hours  Radiological Exams on Admission: No results found.  EKG:  Independently reviewed. NSR  Assessment/Plan Active Problems:   Anemia, transfused 2 units PRBC 01/06/2013, (heme negative)  ---  Symptomatic anemia, likely from progressive CKD -  Transfuse 1 unit PRBC -  Occult stool negative -  Iron studies, b12, folate, TSH, repeat SPEP/UPEP/IFE -  epo level  Presyncope and dizziness, may be secondary to anemia, progressive CKD, orthostasis -  Orthostatic vital signs -  Telemetry -  Cycle troponins -  Transfuse blood -  ASA, swallow screen  -  MRI brain to rule out stroke -  PT/OT  CKD stage IV with worsening creatinine.   -  No acidosis or hyperkalemia -  Minimize nephrotoxins and renally dose medications -  Continue lasix and potassium supplements  CAD, continue aspirin, plavix, BB, statin  T2DM -  D/c metformin due to progressive CKD -  Hold glipizide to avoid hypoglycemia -  Start low dose SSI  HTN/HLD, stable, continue metoprolol, dilt, and statin  PVD, increased claudication recently, followed by Dr. Allyson SabalBerry, continue pletal  Depression/anxiety, stable, continue prozac  Chronic respiratory failure due to COPD, stable on 2L [place]  Diet:  NPO pending RN swallow eval, then diabetic, healthy heart Access:  PIV IVF:  off Proph:  Lovenox, renally dosed  Code Status: full Family Communication: patient alone Disposition Plan: Admit to telemetry  Time spent: 60 min Renae FickleSHORT, MACKENZIE [facility] Pager [phone]  If 7PM-7AM, please contact night-coverage [email or web address] Password [email or web address] 02/20/2014, 4:39 PM

## 2014-02-20 NOTE — ED Notes (Signed)
Admitting at bedside 

## 2014-02-21 ENCOUNTER — Observation Stay (HOSPITAL_COMMUNITY): Payer: Medicare Other

## 2014-02-21 ENCOUNTER — Encounter (HOSPITAL_COMMUNITY): Payer: Self-pay | Admitting: Neurology

## 2014-02-21 DIAGNOSIS — N039 Chronic nephritic syndrome with unspecified morphologic changes: Secondary | ICD-10-CM

## 2014-02-21 DIAGNOSIS — I6330 Cerebral infarction due to thrombosis of unspecified cerebral artery: Secondary | ICD-10-CM | POA: Diagnosis present

## 2014-02-21 DIAGNOSIS — R0902 Hypoxemia: Secondary | ICD-10-CM

## 2014-02-21 DIAGNOSIS — D631 Anemia in chronic kidney disease: Secondary | ICD-10-CM

## 2014-02-21 DIAGNOSIS — N189 Chronic kidney disease, unspecified: Secondary | ICD-10-CM

## 2014-02-21 DIAGNOSIS — J9610 Chronic respiratory failure, unspecified whether with hypoxia or hypercapnia: Secondary | ICD-10-CM

## 2014-02-21 LAB — CBC
HCT: 27.5 % — ABNORMAL LOW (ref 36.0–46.0)
Hemoglobin: 8.7 g/dL — ABNORMAL LOW (ref 12.0–15.0)
MCH: 28.5 pg (ref 26.0–34.0)
MCHC: 31.6 g/dL (ref 30.0–36.0)
MCV: 90.2 fL (ref 78.0–100.0)
Platelets: 315 10*3/uL (ref 150–400)
RBC: 3.05 MIL/uL — ABNORMAL LOW (ref 3.87–5.11)
RDW: 14.3 % (ref 11.5–15.5)
WBC: 11 10*3/uL — AB (ref 4.0–10.5)

## 2014-02-21 LAB — GLUCOSE, CAPILLARY
GLUCOSE-CAPILLARY: 180 mg/dL — AB (ref 70–99)
GLUCOSE-CAPILLARY: 260 mg/dL — AB (ref 70–99)
GLUCOSE-CAPILLARY: 192 mg/dL — AB (ref 70–99)
Glucose-Capillary: 139 mg/dL — ABNORMAL HIGH (ref 70–99)

## 2014-02-21 LAB — BASIC METABOLIC PANEL
BUN: 51 mg/dL — ABNORMAL HIGH (ref 6–23)
CALCIUM: 9.9 mg/dL (ref 8.4–10.5)
CO2: 26 mEq/L (ref 19–32)
CREATININE: 2.74 mg/dL — AB (ref 0.50–1.10)
Chloride: 102 mEq/L (ref 96–112)
GFR, EST AFRICAN AMERICAN: 18 mL/min — AB (ref >90)
GFR, EST NON AFRICAN AMERICAN: 15 mL/min — AB (ref >90)
Glucose, Bld: 112 mg/dL — ABNORMAL HIGH (ref 70–99)
Potassium: 4.2 mEq/L (ref 3.7–5.3)
SODIUM: 141 meq/L (ref 137–147)

## 2014-02-21 LAB — TYPE AND SCREEN
ABO/RH(D): O POS
Antibody Screen: NEGATIVE
UNIT DIVISION: 0

## 2014-02-21 LAB — FERRITIN: Ferritin: 13 ng/mL (ref 10–291)

## 2014-02-21 LAB — IRON AND TIBC
IRON: 79 ug/dL (ref 42–135)
Saturation Ratios: 23 % (ref 20–55)
TIBC: 345 ug/dL (ref 250–470)
UIBC: 266 ug/dL (ref 125–400)

## 2014-02-21 LAB — HEMOGLOBIN A1C
HEMOGLOBIN A1C: 6 % — AB (ref <5.7)
MEAN PLASMA GLUCOSE: 126 mg/dL — AB (ref <117)

## 2014-02-21 LAB — VITAMIN B12: VITAMIN B 12: 626 pg/mL (ref 211–911)

## 2014-02-21 LAB — TRANSFERRIN: Transferrin: 268 mg/dL (ref 200–360)

## 2014-02-21 MED ORDER — LORAZEPAM 2 MG/ML IJ SOLN: 1.0000 mg | Freq: Once | INTRAMUSCULAR | Status: DC

## 2014-02-21 NOTE — Progress Notes (Signed)
Currently awaiting rapid response RN to perform NIH on pt. 4N- nuero unit, unable to send anyone to perform NIH.

## 2014-02-21 NOTE — Care Management Note (Signed)
    Page 1 of 1   02/22/2014     2:39:57 PM CARE MANAGEMENT NOTE 02/22/2014  [facility]   Account Number:  [id number]  Date Initiated:  02/21/2014  Documentation initiated by:  Letha CapeAYLOR,DEBORAH  Subjective/Objective Assessment:   dx weakness, cva, symptomatic anemia  admit- lives with daughter     Action/Plan:   pt eval-no pt f/u needed.   Anticipated DC Date:  02/22/2014   Anticipated DC Plan:  HOME/SELF CARE      DC Planning Services  CM consult      Choice offered to / List presented to:             Status of service:  Completed, signed off Medicare Important Message given?  YES (If response is "NO", the following Medicare IM given date fields will be blank) Date Medicare IM given:  02/22/2014 Date Additional Medicare IM given:    Discharge Disposition:  HOME/SELF CARE  Per UR Regulation:  Reviewed for med. necessity/level of care/duration of stay  If discussed at Long Length of Stay Meetings, dates discussed:    Comments:  02/22/14 1437 Letha Capeeborah Taylor RN, BSN [phone] patient is for dc today, per physical therapy no pt f/u needed. Additional  IM given.

## 2014-02-21 NOTE — Consult Note (Signed)
Referring Physician: Ghimre    Chief Complaint: stroke  HPI:                                                                                                                                         Megan Mathews is an 80 y.o. female who states she has been feeling dizzy, off balance, generally weak, having excessive hunger for the past 6 months. Due to feeling generally weak and "not her self" she was brought to ED.  While admitted to hospital MRI was obtained for the above symptoms. The patient is a poor historian and has multiple general complaints.  She does mention in the end of May she felt woozy and and her BP was 220/120's.  She cannot recall her recent BP. She has been on ASA and Plavix  For her cardiovascular medical issues. MRI of head was reviewed and showed Small acute nonhemorrhagic infarct posterior left operculum/periatrial region.  Neurology was consulted for assistance on stroke.   Date last known well: Unable to determine Time last known well: Unable to determine tPA Given: No: No LSN  Past Medical History  Diagnosis Date  . COPD (chronic obstructive pulmonary disease)     home 02  . OSA (obstructive sleep apnea)   . Hypoxemia   . Secondary pulmonary hypertension 8/09    "severe" by echo  . CAD (coronary artery disease) '95,'96,'98.'99,'08    Multiple PCI/stenting all 3 vessels  . Diabetes mellitus type II   . Hyperlipidemia   . Hypertension   . Peripheral vascular disease '96, July 2010    AoIliac stenting with PTA for ISR 7/10  . Tobacco abuse     quit '99  . Constipation   . Anxiety   . Depression   . Artery stenosis     renal   bilateral  . Gastritis 04/2011    mild gastritis on EGD  . NSTEMI (non-ST elevated myocardial infarction) 01/02/13    in setting of PSVT, +Trop, no ECG change, no cath  . Carotid artery disease     bilateral carotid endarterectomies with redo on the left    Past Surgical History  Procedure Laterality Date  . Carotid  endarterectomy      bilat with redo on Lt  . Total abdominal hysterectomy    . Aaa stent  10/96  . Bilateral iliac stenting  10/96, 7/10  . Resection of vocal chord lesions    . Excision serous cyst adenofibroma    . Coronary angioplasty  2/95    RCA  . Coronary angioplasty with stent placement  8/96    RCA  . Coronary angioplasty with stent placement  5/98    RCA X 2  . Coronary angioplasty with stent placement  11/99    CFX,LAD,Dx  . Coronary angiogram  12/08    patent stents, 40% LM  . 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 Hypertension Mother   . Hypertension Father    Social History:  reports that she quit smoking about 16 years ago. Her smoking use included Cigarettes. She has a 60 pack-year smoking history. She has quit using smokeless tobacco. She reports that she does not drink alcohol or use illicit drugs.  Allergies:  Allergies  Allergen Reactions  . Contrast Media [Iodinated Diagnostic Agents]   . Iohexol Rash and Other (See Comments)    Fever, unable to stand also    Medications:                                                                                                                           Prior to Admission:  Prescriptions prior to admission  Medication Sig Dispense Refill  . acetaminophen (TYLENOL) 500 MG tablet Take 1,000 mg by mouth every 6 (six) hours as needed for mild pain, fever or headache.       . albuterol (PROAIR HFA) 108 (90 BASE) MCG/ACT inhaler Inhale 2 puffs into the lungs every 4 (four) hours as needed (Cough, wheeze, or chest congestion). 2 puffs up to 4 times a day as needed  1 Inhaler  5  . albuterol (PROVENTIL) (2.5 MG/3ML) 0.083% nebulizer solution Take 2.5 mg by nebulization every 6 (six) hours as needed. For shortness of breath      . aspirin EC 81 MG EC tablet Take 1 tablet (81 mg total) by mouth daily.      . clopidogrel (PLAVIX) 75 MG tablet  Take 75 mg by mouth daily with breakfast.      . diltiazem (CARTIA XT) 180 MG 24 hr capsule Take 180 mg by mouth daily.      Marland Kitchen. FLUoxetine (PROZAC) 20 MG capsule Take 20 mg by mouth daily.      . furosemide (LASIX) 40 MG tablet Take 40 mg by mouth 2 (two) times daily.      Marland Kitchen. glimepiride (AMARYL) 2 MG tablet TAKE 1 TABLET BY MOUTH DAILY  30 tablet  5  . guaifenesin (MUCUS RELIEF) 400 MG TABS Take 400 mg by mouth 2 (two) times daily as needed. For congestion      . isosorbide mononitrate (IMDUR) 30 MG 24 hr tablet Take 30 mg by mouth daily.      . isosorbide mononitrate (IMDUR) 60 MG 24 hr tablet Take 60 mg by mouth daily.      . metFORMIN (GLUCOPHAGE) 1000 MG tablet TAKE 1 TABLET BY MOUTH TWICE DAILY WITH A MEAL  180 tablet  3  . metoprolol succinate (TOPROL-XL) 50 MG 24 hr tablet Take 50 mg by mouth daily. Take with or immediately following a meal.      . Multiple Vitamins-Minerals (MULTIVITAL PO) Take 1 tablet by mouth daily.       . nitroGLYCERIN (NITROLINGUAL) 0.4 MG/SPRAY spray Place 1 spray under the tongue  every 5 (five) minutes x 3 doses as needed for chest pain.      Marland Kitchen nystatin (MYCOSTATIN/NYSTOP) 100000 UNIT/GM POWD Apply 1 g topically 2 (two) times daily as needed. rash      . oxybutynin (DITROPAN-XL) 10 MG 24 hr tablet Take 1 tablet (10 mg total) by mouth daily.  90 tablet  3  . oxymetazoline (AFRIN) 0.05 % nasal spray Place 1 spray into both nostrils 2 (two) times daily as needed (for nose bleeds).      . pantoprazole (PROTONIX) 40 MG tablet Take 40 mg by mouth daily.      . pentoxifylline (TRENTAL) 400 MG CR tablet Take 1 by mouth three times day      . potassium chloride (MICRO-K) 10 MEQ CR capsule Take 20 mEq by mouth 2 (two) times daily.      . simvastatin (ZOCOR) 80 MG tablet Take 80 mg by mouth daily.      Marland Kitchen tiotropium (SPIRIVA) 18 MCG inhalation capsule Place 18 mcg into inhaler and inhale daily.       Scheduled: . aspirin EC  81 mg Oral Daily  . atorvastatin  40 mg Oral  q1800  . clopidogrel  75 mg Oral Q breakfast  . diltiazem  180 mg Oral Daily  . docusate sodium  100 mg Oral BID  . enoxaparin (LOVENOX) injection  30 mg Subcutaneous Q24H  . FLUoxetine  20 mg Oral Daily  . furosemide  40 mg Oral BID  . insulin aspart  0-9 Units Subcutaneous TID WC  . isosorbide mononitrate  90 mg Oral Daily  . LORazepam  1 mg Intravenous Once  . metoprolol succinate  50 mg Oral Daily  . oxybutynin  10 mg Oral Daily  . pantoprazole  40 mg Oral Daily  . pentoxifylline  400 mg Oral TID WC  . potassium chloride  20 mEq Oral BID  . senna  1 tablet Oral BID  . sodium chloride  3 mL Intravenous Q12H  . tiotropium  18 mcg Inhalation Daily    ROS:                                                                                                                                       History obtained from the patient  General ROS: negative for - chills, fatigue, fever, night sweats, weight gain or weight loss Psychological ROS: negative for - behavioral disorder, hallucinations, memory difficulties, mood swings or suicidal ideation Ophthalmic ROS: negative for - blurry vision, double vision, eye pain or loss of vision ENT ROS: negative for - epistaxis, nasal discharge, oral lesions, sore throat, tinnitus or vertigo Allergy and Immunology ROS: negative for - hives or itchy/watery eyes Hematological and Lymphatic ROS: negative for - bleeding problems, bruising or swollen lymph nodes Endocrine ROS: negative for - galactorrhea, hair pattern changes, polydipsia/polyuria or temperature intolerance Respiratory ROS: negative for - cough, hemoptysis, shortness  of breath or wheezing Cardiovascular ROS: negative for - chest pain, dyspnea on exertion, edema or irregular heartbeat Gastrointestinal ROS: negative for - abdominal pain, diarrhea, hematemesis, nausea/vomiting or stool incontinence Genito-Urinary ROS: negative for - dysuria, hematuria, incontinence or urinary  frequency/urgency Musculoskeletal ROS: negative for - joint swelling or muscular weakness Neurological ROS: as noted in HPI Dermatological ROS: negative for rash and skin lesion changes  Neurologic Examination:                                                                                                      Blood pressure 144/66, pulse 60, temperature 98.8 F (37.1 C), temperature source Oral, resp. rate 19, height 5' (1.524 m), weight 88.2 kg (194 lb 7.1 oz), SpO2 98.00%.  Mental Status: Alert, oriented, thought content appropriate.  Speech fluent without evidence of aphasia.  Able to follow 3 step commands without difficulty. Cranial Nerves: II: Discs flat bilaterally; Visual fields grossly normal, pupils equal, round, reactive to light and accommodation III,IV, VI: ptosis not present, extra-ocular motions intact bilaterally V,VII: smile symmetric, facial light touch sensation normal bilaterally VIII: hearing normal bilaterally IX,X: gag reflex present XI: bilateral shoulder shrug XII: midline tongue extension without atrophy or fasciculations  Motor: Right : Upper extremity   5/5    Left:     Upper extremity   5/5  Lower extremity   5/5     Lower extremity   5/5 Tone and bulk:normal tone throughout; no atrophy noted Sensory: Pinprick and light touch intact throughout, bilaterally Deep Tendon Reflexes:  Right: Upper Extremity   Left: Upper extremity   biceps (C-5 to C-6) 2/4   biceps (C-5 to C-6) 2/4 tricep (C7) 2/4    triceps (C7) 2/4 Brachioradialis (C6) 2/4  Brachioradialis (C6) 2/4  Lower Extremity Lower Extremity  quadriceps (L-2 to L-4) 2/4   quadriceps (L-2 to L-4) 2/4 Achilles (S1) 2/4   Achilles (S1) 2/4  Plantars: Right: downgoing   Left: downgoing Cerebellar: normal finger-to-nose,  normal heel-to-shin test Gait: not tested.  CV: pulses palpable throughout    Lab Results: Basic Metabolic Panel:  Recent Labs Lab 02/20/14 1332 02/21/14 0453  NA 144 141   K 4.7 4.2  CL 104 102  CO2 26 26  GLUCOSE 155* 112*  BUN 54* 51*  CREATININE 2.82* 2.74*  CALCIUM 9.8 9.9    Liver Function Tests:  Recent Labs Lab 02/20/14 1332  AST 13  ALT 8  ALKPHOS 55  BILITOT <0.2*  PROT 7.3  ALBUMIN 3.4*   No results found for this basename: LIPASE, AMYLASE,  in the last 168 hours No results found for this basename: AMMONIA,  in the last 168 hours  CBC:  Recent Labs Lab 02/20/14 1332 02/20/14 2116 02/21/14 0453  WBC 11.0* 11.1* 11.0*  NEUTROABS 8.9*  --   --   HGB 7.9* 8.5* 8.7*  HCT 25.0* 26.7* 27.5*  MCV 94.3 90.2 90.2  PLT 330 299 315    Cardiac Enzymes: No results found for this basename: CKTOTAL, CKMB, CKMBINDEX, TROPONINI,  in the last 168 hours  Lipid Panel: No results found for this basename: CHOL, TRIG, HDL, CHOLHDL, VLDL, LDLCALC,  in the last 168 hours  CBG:  Recent Labs Lab 02/20/14 1718 02/20/14 2137 02/21/14 0821 02/21/14 1211  GLUCAP 110* 174* 139* 180*    Microbiology: Results for orders placed during the hospital encounter of 01/02/13  MRSA PCR SCREENING     Status: None   Collection Time    01/03/13  8:37 AM      Result Value Ref Range Status   MRSA by PCR NEGATIVE  NEGATIVE Final   Comment:            The GeneXpert MRSA Assay (FDA     approved for NASAL specimens     only), is one component of a     comprehensive MRSA colonization     surveillance program. It is not     intended to diagnose MRSA     infection nor to guide or     monitor treatment for     MRSA infections.    Coagulation Studies: No results found for this basename: LABPROT, INR,  in the last 72 hours  Imaging: Mr Shirlee Latch Wo Contrast  02/21/2014   CLINICAL DATA:  Fatigue. Dizziness. Off balance for the past month. Diabetic hypertensive hyperlipidemia 80 year old female.  EXAM: MRI HEAD WITHOUT CONTRAST  MRA HEAD WITHOUT CONTRAST  TECHNIQUE: Multiplanar, multiecho pulse sequences of the brain and surrounding structures were  obtained without intravenous contrast. Angiographic images of the head were obtained using MRA technique without contrast.  COMPARISON:  12/23/2004.  FINDINGS: MRI HEAD FINDINGS  Small acute nonhemorrhagic infarct posterior left operculum/periatrial region.  Focal area of restricted motion central aspect of the splenium of the corpus callosum. This may reflect changes of subacute infarct given the surrounding findings. Other causes of splenial abnormality such as that related to medication exposure, toxic exposure or metabolic abnormality are not excluded although secondary considerations.  Prominent white matter type changes most consistent with result of small vessel disease.  Remote tiny cerebellar infarcts larger on the right.  No intracranial hemorrhage.  Mild transverse ligament hypertrophy. C3-4 cervical spondylotic changes with slight cord flattening.  No hydrocephalus.  MRA HEAD FINDINGS  Narrowing of the cavernous/ supraclinoid segment of the right internal carotid artery may be explained by artifact rather than true narrowing. Otherwise, anterior circulation without medium or large size vessel significant stenosis or occlusion.  Mild middle cerebral artery branch vessel irregularity bilaterally.  Mild narrowing distal left vertebral artery. Minimal narrowing distal right vertebral artery.  Poor delineation/evaluation of the posterior inferior cerebellar arteries.  Large irregular right anterior inferior cerebellar artery. Non visualized left anterior inferior cerebellar artery.  Slight irregularity of the basilar artery without high-grade stenosis.  Moderate tandem stenosis of the superior cerebellar artery bilaterally.  Mild irregularity and narrowing of portions of the posterior cerebral artery bilaterally.  No aneurysm detected.  IMPRESSION: MRI HEAD:  Small acute nonhemorrhagic infarct posterior left operculum/periatrial region.  Focal area of restricted motion central aspect of the splenium of the  corpus callosum. This may reflect changes of subacute infarct given the surrounding findings. Other causes of splenial abnormality such as that related to medication exposure, toxic exposure or metabolic abnormality are not excluded although secondary considerations.  Prominent white matter type changes most consistent with result of small vessel disease.  Remote tiny cerebellar infarcts larger on the right.  C3-4 cervical spondylotic changes with slight cord flattening.  MRA HEAD;  Narrowing of the cavernous/ supraclinoid  segment of the right internal carotid artery may be explained by artifact rather than true narrowing. Otherwise, anterior circulation without medium or large size vessel significant stenosis or occlusion.  Mild middle cerebral artery branch vessel irregularity bilaterally.  Mild narrowing distal left vertebral artery. Minimal narrowing distal right vertebral artery.  Poor delineation/evaluation of the posterior inferior cerebellar arteries.  Large irregular right anterior inferior cerebellar artery. Non visualized left anterior inferior cerebellar artery.  Slight irregularity of the basilar artery without high-grade stenosis.  Moderate tandem stenosis of the superior cerebellar artery bilaterally.  Mild irregularity and narrowing of portions of the posterior cerebral artery bilaterally.   Electronically Signed   By: Bridgett Larsson M.D.   On: 02/21/2014 12:55   Mr Brain Wo Contrast  02/21/2014   CLINICAL DATA:  Fatigue. Dizziness. Off balance for the past month. Diabetic hypertensive hyperlipidemia 80 year old female.  EXAM: MRI HEAD WITHOUT CONTRAST  MRA HEAD WITHOUT CONTRAST  TECHNIQUE: Multiplanar, multiecho pulse sequences of the brain and surrounding structures were obtained without intravenous contrast. Angiographic images of the head were obtained using MRA technique without contrast.  COMPARISON:  12/23/2004.  FINDINGS: MRI HEAD FINDINGS  Small acute nonhemorrhagic infarct posterior left  operculum/periatrial region.  Focal area of restricted motion central aspect of the splenium of the corpus callosum. This may reflect changes of subacute infarct given the surrounding findings. Other causes of splenial abnormality such as that related to medication exposure, toxic exposure or metabolic abnormality are not excluded although secondary considerations.  Prominent white matter type changes most consistent with result of small vessel disease.  Remote tiny cerebellar infarcts larger on the right.  No intracranial hemorrhage.  Mild transverse ligament hypertrophy. C3-4 cervical spondylotic changes with slight cord flattening.  No hydrocephalus.  MRA HEAD FINDINGS  Narrowing of the cavernous/ supraclinoid segment of the right internal carotid artery may be explained by artifact rather than true narrowing. Otherwise, anterior circulation without medium or large size vessel significant stenosis or occlusion.  Mild middle cerebral artery branch vessel irregularity bilaterally.  Mild narrowing distal left vertebral artery. Minimal narrowing distal right vertebral artery.  Poor delineation/evaluation of the posterior inferior cerebellar arteries.  Large irregular right anterior inferior cerebellar artery. Non visualized left anterior inferior cerebellar artery.  Slight irregularity of the basilar artery without high-grade stenosis.  Moderate tandem stenosis of the superior cerebellar artery bilaterally.  Mild irregularity and narrowing of portions of the posterior cerebral artery bilaterally.  No aneurysm detected.  IMPRESSION: MRI HEAD:  Small acute nonhemorrhagic infarct posterior left operculum/periatrial region.  Focal area of restricted motion central aspect of the splenium of the corpus callosum. This may reflect changes of subacute infarct given the surrounding findings. Other causes of splenial abnormality such as that related to medication exposure, toxic exposure or metabolic abnormality are not  excluded although secondary considerations.  Prominent white matter type changes most consistent with result of small vessel disease.  Remote tiny cerebellar infarcts larger on the right.  C3-4 cervical spondylotic changes with slight cord flattening.  MRA HEAD;  Narrowing of the cavernous/ supraclinoid segment of the right internal carotid artery may be explained by artifact rather than true narrowing. Otherwise, anterior circulation without medium or large size vessel significant stenosis or occlusion.  Mild middle cerebral artery branch vessel irregularity bilaterally.  Mild narrowing distal left vertebral artery. Minimal narrowing distal right vertebral artery.  Poor delineation/evaluation of the posterior inferior cerebellar arteries.  Large irregular right anterior inferior cerebellar artery. Non visualized left anterior inferior  cerebellar artery.  Slight irregularity of the basilar artery without high-grade stenosis.  Moderate tandem stenosis of the superior cerebellar artery bilaterally.  Mild irregularity and narrowing of portions of the posterior cerebral artery bilaterally.   Electronically Signed   By: Bridgett Larsson M.D.   On: 02/21/2014 12:55     Carotid doppler 12/12/13--bilateral 50-69% stenosis  A1c 6.0   Assessment and plan discussed with with attending physician and they are in agreement.    Megan Morn PA-C [facility] [phone]  02/21/2014, 1:55 PM   Assessment: 80 y.o. female with small acute nonhemorrhagic infarct posterior left operculum/periatrial region. With patients multiple risk factors, further stroke work up would be beneficial.  Her Cr of 2.74 will not allow CTA neck, however, MRA neck has been ordered.    Stroke Risk Factors - diabetes mellitus, hyperlipidemia, hypertension and CAD and carotid artery disease.   1. Fasting lipid panel 2.  MRA  of the neck to evaluate carotid stenosis further--recent carotid dopplers showed bilateral 50-69% stenosis 3.  PT consult, OT consult, Speech consult 4. Echocardiogram 6. Prophylactic therapy-Continue ASA and Plavix 7. Risk factor modification 8. Telemetry monitoring 9. Frequent neuro checks  Stroke team to follow in AM   Patient seen and examined together with physician assistant and I concur with the assessment and plan.  Wyatt Portela, MD

## 2014-02-21 NOTE — Evaluation (Addendum)
Physical Therapy Evaluation Patient Details Name: Megan Mathews MRN: [id number] DOB: 06/23/1932 Today's Date: 02/21/2014   History of Present Illness  80 y.o. year-old female with history of COPD, OSA, CAD s/p multiple PCI with stenting to 3 vessels, last NSTEMI in 12/2012 without follow up catheterization secondary to progressive CKD, CKD stage IV, T2DM, PVD, depression/anxiety who presents with progressive fatigue.  Adm due to progressive weakness, dyspnea on exertion where she can only walk across approximately one room before she becomes winded.   Clinical Impression  Pt adm from home due to the above. Presents with decreased independence with functional mobility and overall deconditioning. Pt to benefit from skilled acute PT to address deficits and maximize independence with mobility prior to returning home with daughter. Pt reports she is close to her baseline and denies any dizziness during session. Reports she will have 24/7 (A) upon acute D/C from daughter.     Follow Up Recommendations No PT follow up;Supervision/Assistance - 24 hour    Equipment Recommendations  None recommended by PT    Recommendations for Other Services       Precautions / Restrictions Precautions Precautions: None Precaution Comments: denies any falls Restrictions Weight Bearing Restrictions: No      Mobility  Bed Mobility Overal bed mobility: Needs Assistance Bed Mobility: Supine to Sit     Supine to sit: Min assist;HOB elevated     General bed mobility comments: handheld (A) provided to elevate trunk; pt relies on handrails   Transfers Overall transfer level: Needs assistance Equipment used: Rolling walker (2 wheeled) Transfers: Sit to/from Stand Sit to Stand: Supervision         General transfer comment: cues for safety and hand placement with RW; pt relies heavily on handicap rail for support with sit to stand from toilet   Ambulation/Gait Ambulation/Gait assistance: Min  guard Ambulation Distance (Feet): 100 Feet Assistive device: Rolling walker (2 wheeled) Gait Pattern/deviations: Step-through pattern;Wide base of support;Drifts right/left;Decreased stride length Gait velocity: decreased Gait velocity interpretation: Below normal speed for age/gender General Gait Details: cues for upright posture and safety with RW; pt denied any SOB or dizziness with mobility  Stairs            Wheelchair Mobility    Modified Rankin (Stroke Patients Only)       Balance Overall balance assessment: Needs assistance Sitting-balance support: Feet supported;No upper extremity supported Sitting balance-Leahy Scale: Good     Standing balance support: During functional activity;No upper extremity supported Standing balance-Leahy Scale: Fair Standing balance comment: was able to stand at sink without UE support and reach out of BOS minimally                              Pertinent Vitals/Pain Denied any pain or dizziness this session     Home Living Family/patient expects to be discharged to:: Private residence Living Arrangements: Children Available Help at Discharge: Family;Available 24 hours/day Type of Home: House Home Access: Level entry     Home Layout: One level Home Equipment: Walker - 4 wheels;Cane - single point      Prior Function Level of Independence: Independent with assistive device(s)         Comments: pt reports she ambulates with cane or rollator; daughter drives her     Hand Dominance        Extremity/Trunk Assessment   Upper Extremity Assessment: Defer to OT evaluation  Lower Extremity Assessment: Overall WFL for tasks assessed      Cervical / Trunk Assessment: Kyphotic  Communication   Communication: HOH  Cognition Arousal/Alertness: Awake/alert Behavior During Therapy: WFL for tasks assessed/performed Overall Cognitive Status: Within Functional Limits for tasks assessed                       General Comments      Exercises General Exercises - Lower Extremity Ankle Circles/Pumps: AROM;Strengthening;Both;10 reps;Seated      Assessment/Plan    PT Assessment Patient needs continued PT services  PT Diagnosis Abnormality of gait   PT Problem List Decreased activity tolerance;Decreased balance;Decreased mobility;Decreased knowledge of use of DME  PT Treatment Interventions DME instruction;Gait training;Functional mobility training;Therapeutic activities;Therapeutic exercise;Neuromuscular re-education;Balance training;Patient/family education   PT Goals (Current goals can be found in the Care Plan section) Acute Rehab PT Goals Patient Stated Goal: to go home and not be dizzy PT Goal Formulation: With patient Time For Goal Achievement: 02/25/14 Potential to Achieve Goals: Good    Frequency Min 3X/week   Barriers to discharge        Co-evaluation               End of Session Equipment Utilized During Treatment: Gait belt Activity Tolerance: Patient tolerated treatment well Patient left: in chair;with call bell/phone within reach;with chair alarm set Nurse Communication: Mobility status    Functional Assessment Tool Used: clinical judgement  Functional Limitation: Mobility: Walking and moving around Mobility: Walking and Moving Around Current Status [phone]([id number]): At least 1 percent but less than 20 percent impaired, limited or restricted Mobility: Walking and Moving Around Goal Status [phone]([id number]): 0 percent impaired, limited or restricted    Time: 0753-0813 PT Time Calculation (min): 20 min   Charges:   PT Evaluation $Initial PT Evaluation Tier I: 1 Procedure PT Treatments $Gait Training: 8-22 mins   PT G Codes:   Functional Assessment Tool Used: clinical judgement  Functional Limitation: Mobility: Walking and moving around    [place], GrovetonBrittany N, [place]PT  [phone] 02/21/2014, 8:53 AM

## 2014-02-21 NOTE — Progress Notes (Signed)
PATIENT DETAILS Name: Megan Mathews Age: 80 y.o. Sex: female Date of Birth: 03/23/1932 Admit Date: 02/20/2014 Admitting Physician Renae FickleMackenzie Short, MD EAV:[id number]:Michael Norins, MD  Subjective: No major complaints this am  Assessment/Plan: Active Problems: Acute CVA -admitted with dizziness/pre-syncope-MRI brain positive for Acute CVA-not sure this explains her presenting symptoms -already on ASA,Plavix -has hx of Carotid stenosis-await MRA neck -await Neuro eval -SLP,PT eval  Anemia -chronic issue -FOBT neg -likely secondary to CKD -Transfused 1 unit of PRBC-Hb stable at 8.7  CKD Stage 4 -creatinine close to usual baseline. -monitor perioddically  CAD -stable -continue aspirin, plavix, BB, statin  T2DM  - D/c metformin due to progressive CKD,hold glipizide to avoid hypoglycemia  -c/w low dose SSI-CBG  HTN -controlled with Metoprolol, Diltiazem  Dyslipidemia -c/w statins  Depression/anxiety -stable, continue prozac  Chronic respiratory failure due to COPD - stable on 2L [place]  Disposition: Remain inpatient  DVT Prophylaxis: Prophylactic Lovenox   Code Status: Full code   Family Communication None at bedside  Procedures:  None  CONSULTS:  neurology  Time spent 40 minutes-which includes 50% of the time with face-to-face with patient/ family and coordinating care related to the above assessment and plan.    MEDICATIONS: Scheduled Meds: . aspirin EC  81 mg Oral Daily  . atorvastatin  40 mg Oral q1800  . clopidogrel  75 mg Oral Q breakfast  . diltiazem  180 mg Oral Daily  . docusate sodium  100 mg Oral BID  . enoxaparin (LOVENOX) injection  30 mg Subcutaneous Q24H  . FLUoxetine  20 mg Oral Daily  . furosemide  40 mg Oral BID  . insulin aspart  0-9 Units Subcutaneous TID WC  . isosorbide mononitrate  90 mg Oral Daily  . LORazepam  1 mg Intravenous Once  . metoprolol succinate  50 mg Oral Daily  . oxybutynin  10 mg Oral Daily  .  pantoprazole  40 mg Oral Daily  . pentoxifylline  400 mg Oral TID WC  . potassium chloride  20 mEq Oral BID  . senna  1 tablet Oral BID  . sodium chloride  3 mL Intravenous Q12H  . tiotropium  18 mcg Inhalation Daily   Continuous Infusions:  PRN Meds:.acetaminophen, hydrALAZINE, nitroGLYCERIN, nystatin, ondansetron (ZOFRAN) IV, ondansetron, oxymetazoline  Antibiotics: Anti-infectives   Start     Dose/Rate Route Frequency Ordered Stop   02/20/14 1706  Nystatin POWD 1 application  Status:  Discontinued     1 application Oral 2 times daily PRN 02/20/14 1707 02/20/14 1809       PHYSICAL EXAM: Vital signs in last 24 hours: Filed Vitals:   02/20/14 1830 02/20/14 2009 02/21/14 0510 02/21/14 0906  BP: 196/67 123/55 162/68 144/66  Pulse: 65 66 60 60  Temp: 98.2 F (36.8 C) 98.3 F (36.8 C) 98.2 F (36.8 C) 98.8 F (37.1 C)  TempSrc: Oral Oral Oral Oral  Resp: 18 16 18 19   Height:      Weight:   88.2 kg (194 lb 7.1 oz)   SpO2: 100% 100% 100% 98%    Weight change:  Filed Weights   02/20/14 1159 02/21/14 0510  Weight: 63.504 kg (140 lb) 88.2 kg (194 lb 7.1 oz)   Body mass index is 37.98 kg/(m^2).   Gen Exam: Awake and alert with clear speech.   Neck: Supple, No JVD.   Chest: B/L Clear.   CVS: S1 S2 Regular, no murmurs.  Abdomen: soft, BS +,  non tender, non distended.  Extremities: no edema, lower extremities warm to touch. Neurologic: Non Focal.   Skin: No Rash.   Wounds: N/A.   Intake/Output from previous day:  Intake/Output Summary (Last 24 hours) at 02/21/14 1250 Last data filed at 02/21/14 0939  Gross per 24 hour  Intake    463 ml  Output      0 ml  Net    463 ml     LAB RESULTS: CBC  Recent Labs Lab 02/20/14 1332 02/20/14 2116 02/21/14 0453  WBC 11.0* 11.1* 11.0*  HGB 7.9* 8.5* 8.7*  HCT 25.0* 26.7* 27.5*  PLT 330 299 315  MCV 94.3 90.2 90.2  MCH 29.8 28.7 28.5  MCHC 31.6 31.8 31.6  RDW 12.9 14.0 14.3  LYMPHSABS 1.2  --   --   MONOABS 0.7  --    --   EOSABS 0.2  --   --   BASOSABS 0.0  --   --     Chemistries   Recent Labs Lab 02/20/14 1332 02/21/14 0453  NA 144 141  K 4.7 4.2  CL 104 102  CO2 26 26  GLUCOSE 155* 112*  BUN 54* 51*  CREATININE 2.82* 2.74*  CALCIUM 9.8 9.9    CBG:  Recent Labs Lab 02/20/14 1718 02/20/14 2137 02/21/14 0821 02/21/14 1211  GLUCAP 110* 174* 139* 180*    GFR Estimated Creatinine Clearance: 16.2 ml/min (by C-G formula based on Cr of 2.74).  Coagulation profile No results found for this basename: INR, PROTIME,  in the last 168 hours  Cardiac Enzymes No results found for this basename: CK, CKMB, TROPONINI, MYOGLOBIN,  in the last 168 hours  No components found with this basename: POCBNP,  No results found for this basename: DDIMER,  in the last 72 hours  Recent Labs  02/20/14 1830  HGBA1C 6.0*   No results found for this basename: CHOL, HDL, LDLCALC, TRIG, CHOLHDL, LDLDIRECT,  in the last 72 hours  Recent Labs  02/20/14 1707  TSH 1.290    Recent Labs  02/21/14 0453  TIBC 345  IRON 79   No results found for this basename: LIPASE, AMYLASE,  in the last 72 hours  Urine Studies No results found for this basename: UACOL, UAPR, USPG, UPH, UTP, UGL, UKET, UBIL, UHGB, UNIT, UROB, ULEU, UEPI, UWBC, URBC, UBAC, CAST, CRYS, UCOM, BILUA,  in the last 72 hours  MICROBIOLOGY: No results found for this or any previous visit (from the past 240 hour(s)).  RADIOLOGY STUDIES/RESULTS: No results found.  Jeoffrey MassedGHIMIRE,SHANKER, MD  [facility] Pager:[phone]  If 7PM-7AM, please contact night-coverage [email or web address] Password [email or web address] 02/21/2014, 12:50 PM   LOS: 1 day   **Disclaimer: This note may have been dictated with voice recognition software. Similar sounding words can inadvertently be transcribed and this note may contain transcription errors which may not have been corrected upon publication of note.**

## 2014-02-21 NOTE — Progress Notes (Signed)
UR Completed Sherena Machorro Graves-Bigelow, RN,BSN [phone]  

## 2014-02-22 ENCOUNTER — Other Ambulatory Visit: Payer: Self-pay | Admitting: *Deleted

## 2014-02-22 DIAGNOSIS — I6529 Occlusion and stenosis of unspecified carotid artery: Secondary | ICD-10-CM

## 2014-02-22 DIAGNOSIS — I059 Rheumatic mitral valve disease, unspecified: Secondary | ICD-10-CM

## 2014-02-22 DIAGNOSIS — I63239 Cerebral infarction due to unspecified occlusion or stenosis of unspecified carotid arteries: Secondary | ICD-10-CM

## 2014-02-22 DIAGNOSIS — Z01810 Encounter for preprocedural cardiovascular examination: Secondary | ICD-10-CM

## 2014-02-22 LAB — LIPID PANEL
Cholesterol: 166 mg/dL (ref 0–200)
HDL: 51 mg/dL (ref >39)
LDL CALC: 89 mg/dL (ref 0–99)
Total CHOL/HDL Ratio: 3.3 RATIO
Triglycerides: 129 mg/dL (ref <150)
VLDL: 26 mg/dL (ref 0–40)

## 2014-02-22 LAB — GLUCOSE, CAPILLARY
GLUCOSE-CAPILLARY: 180 mg/dL — AB (ref 70–99)
Glucose-Capillary: 267 mg/dL — ABNORMAL HIGH (ref 70–99)

## 2014-02-22 LAB — FOLATE RBC: RBC FOLATE: 1164 ng/mL — AB (ref >280)

## 2014-02-22 NOTE — Progress Notes (Signed)
Stroke Team Progress Note  HISTORY Megan Mathews is an 80 y.o. female who states she has been feeling dizzy, off balance, generally weak, having excessive hunger for the past 6 months. Due to feeling generally weak and "not her self" she was brought to ED. While admitted to hospital MRI was obtained for the above symptoms. The patient is a poor historian and has multiple general complaints. She does mention in the end of May she felt woozy and and her BP was 220/120's. She cannot recall her recent BP. She has been on ASA and Plavix For her cardiovascular medical issues. MRI of head was reviewed and showed Small acute nonhemorrhagic infarct posterior left operculum/periatrial region. Neurology was consulted for assistance on stroke. Unable to determine last known well, Patient was not administered TPA secondary to unknown time last known well. She was admitted for further evaluation and treatment.  SUBJECTIVE Her echo tech is at the bedside doing her 2D.  Overall she feels her condition is stable.   OBJECTIVE Most recent Vital Signs: Filed Vitals:   02/21/14 1435 02/21/14 2117 02/22/14 0509 02/22/14 1047  BP: 129/66 168/52 161/66 166/71  Pulse: 57 62 73   Temp: 98.6 F (37 C) 98.4 F (36.9 C) 98 F (36.7 C)   TempSrc: Oral Oral Oral   Resp: 20 18 16    Height:      Weight:   69.2 kg (152 lb 8.9 oz)   SpO2: 99% 98% 96%    CBG (last 3)   Recent Labs  02/21/14 1704 02/21/14 2132 02/22/14 0823  GLUCAP 260* 192* 180*    IV Fluid Intake:     MEDICATIONS  . aspirin EC  81 mg Oral Daily  . atorvastatin  40 mg Oral q1800  . clopidogrel  75 mg Oral Q breakfast  . diltiazem  180 mg Oral Daily  . docusate sodium  100 mg Oral BID  . enoxaparin (LOVENOX) injection  30 mg Subcutaneous Q24H  . FLUoxetine  20 mg Oral Daily  . furosemide  40 mg Oral BID  . insulin aspart  0-9 Units Subcutaneous TID WC  . isosorbide mononitrate  90 mg Oral Daily  . LORazepam  1 mg Intravenous Once  .  metoprolol succinate  50 mg Oral Daily  . oxybutynin  10 mg Oral Daily  . pantoprazole  40 mg Oral Daily  . pentoxifylline  400 mg Oral TID WC  . potassium chloride  20 mEq Oral BID  . senna  1 tablet Oral BID  . sodium chloride  3 mL Intravenous Q12H  . tiotropium  18 mcg Inhalation Daily   PRN:  acetaminophen, hydrALAZINE, nitroGLYCERIN, nystatin, ondansetron (ZOFRAN) IV, ondansetron, oxymetazoline  Diet:  Carb Control thin liquids Activity:    Up with assistance DVT Prophylaxis:  Lovenox 30 mg sq daily   CLINICALLY SIGNIFICANT STUDIES Basic Metabolic Panel:  Recent Labs Lab 02/20/14 1332 02/21/14 0453  NA 144 141  K 4.7 4.2  CL 104 102  CO2 26 26  GLUCOSE 155* 112*  BUN 54* 51*  CREATININE 2.82* 2.74*  CALCIUM 9.8 9.9   Liver Function Tests:  Recent Labs Lab 02/20/14 1332  AST 13  ALT 8  ALKPHOS 55  BILITOT <0.2*  PROT 7.3  ALBUMIN 3.4*   CBC:  Recent Labs Lab 02/20/14 1332 02/20/14 2116 02/21/14 0453  WBC 11.0* 11.1* 11.0*  NEUTROABS 8.9*  --   --   HGB 7.9* 8.5* 8.7*  HCT 25.0* 26.7* 27.5*  MCV 94.3 90.2 90.2  PLT 330 299 315   Coagulation: No results found for this basename: LABPROT, INR,  in the last 168 hours Cardiac Enzymes: No results found for this basename: CKTOTAL, CKMB, CKMBINDEX, TROPONINI,  in the last 168 hours Urinalysis: No results found for this basename: COLORURINE, APPERANCEUR, LABSPEC, PHURINE, GLUCOSEU, HGBUR, BILIRUBINUR, KETONESUR, PROTEINUR, UROBILINOGEN, NITRITE, LEUKOCYTESUR,  in the last 168 hours Lipid Panel    Component Value Date/Time   CHOL 166 02/22/2014 0641   TRIG 129 02/22/2014 0641   HDL 51 02/22/2014 0641   CHOLHDL 3.3 02/22/2014 0641   VLDL 26 02/22/2014 0641   LDLCALC 89 02/22/2014 0641   HgbA1C  Lab Results  Component Value Date   HGBA1C 6.0* 02/20/2014    Urine Drug Screen:   No results found for this basename: labopia, cocainscrnur, labbenz, amphetmu, thcu, labbarb    Alcohol Level: No results found for  this basename: ETH,  in the last 168 hours  MRI of the brain  02/21/2014     Small acute nonhemorrhagic infarct posterior left operculum/periatrial region.  Focal area of restricted motion central aspect of the splenium of the corpus callosum. This may reflect changes of subacute infarct given the surrounding findings. Other causes of splenial abnormality such as that related to medication exposure, toxic exposure or metabolic abnormality are not excluded although secondary considerations.  Prominent white matter type changes most consistent with result of small vessel disease.  Remote tiny cerebellar infarcts larger on the right.  C3-4 cervical spondylotic changes with slight cord flattening.  MRA of the brain  02/21/2014  Narrowing of the cavernous/ supraclinoid segment of the right internal carotid artery may be explained by artifact rather than true narrowing. Otherwise, anterior circulation without medium or large size vessel significant stenosis or occlusion.  Mild middle cerebral artery branch vessel irregularity bilaterally.  Mild narrowing distal left vertebral artery. Minimal narrowing distal right vertebral artery.  Poor delineation/evaluation of the posterior inferior cerebellar arteries.  Large irregular right anterior inferior cerebellar artery. Non visualized left anterior inferior cerebellar artery.  Slight irregularity of the basilar artery without high-grade stenosis.  Moderate tandem stenosis of the superior cerebellar artery bilaterally.  Mild irregularity and narrowing of portions of the posterior cerebral artery bilaterally.   2D Echocardiogram    Carotid Doppler  12/12/2013 R CEA 50-69%, L bulb 70-99%, L ICA 50-69%  EKG  normal sinus rhythm. For complete results please see formal report.   Therapy Recommendations no PT  Physical Exam   Pleasant elderly lady not in distress.Awake alert. Afebrile. Head is nontraumatic. Neck is supple without bruit. Hearing is normal. Cardiac exam no  murmur or gallop. Lungs are clear to auscultation. Distal pulses are well felt. Neurological Exam ;  Awake  Alert oriented x 3. Normal speech and language.eye movements full without nystagmus.fundi were not visualized. Vision acuity and fields appear normal. Hearing is normal. Palatal movements are normal. Face symmetric. Tongue midline. Normal strength, tone, reflexes and coordination. Normal sensation. Gait deferred. ASSESSMENT Ms. Megan Mathews is a 80 y.o. female presenting with feeling dizzy, off balance, generally weak, having excessive hunger for the past 6 months. Imaging confirms  posterior left operculum/periatrial region infarct. Infarct possibly thromboembolic secondary to known L ICA stenosis, 70-99% in the bulb - followed by [facility]. Has had bilateral CEAs.  On aspirin 81 mg orally every day and clopidogrel 75 mg orally every day prior to admission. Now on aspirin 81 mg orally every day and clopidogrel 75  mg orally every day for secondary stroke prevention. Patient with no resultant neuro deficits. Stroke work up underway.   hypertension  Hyperlipidemia, LDL 89, on zocor 80 PTA, now on lipitor 40, goal LDL < 70 for diabetics Diabetes, type 2, HgbA1c 6.0, atgoal < 7.0  PVD  Secondary pulmonary hypertension  CAD, NSTEMI, stent  Bilateral RAS  CKD, stage 4  Anemia, secondary to CKD obstructive sleep apnea Depression/anxiety Chronic resp failure d/t COPD  Hospital day # 2  TREATMENT/PLAN  Continue aspirin 81 mg orally every day and clopidogrel 75 mg orally every day for secondary stroke prevention.  [facility] consulted to evaluate for L ICA intervention  F/u 2D echo  SIGNED Annie MainSHARON BIBY, MSN, RN, ANVP-BC, ANP-BC, GNP-BC [facility] Pager: [phone] 02/22/2014 1:06 PM   I have personally obtained a history, examined the patient, evaluated imaging results, and formulated the assessment and plan of care. I agree with the above. Delia HeadyPramod Sethi, MD   To  contact Stroke Continuity Meridee Branum, please refer to [email or web address]. After hours, contact General Neurology

## 2014-02-22 NOTE — Discharge Summary (Signed)
PATIENT DETAILS Name: Megan Mathews Age: 80 y.o. Sex: female Date of Birth: [birth year] MRN: [id number]. Admit Date: 02/20/2014 Admitting Physician: Renae Fickle, MD NWG:NFAOZHY Norins, MD  Recommendations for Outpatient Follow-up:  1. Needs Erythropoetin injections for anemia of CKD. Will defer to PCP 2. Dr Early-[facility] will coordinate with Cardiology for Carotid Endarterectomy  3. Please repeat CBC/BMET next visit  PRIMARY DISCHARGE DIAGNOSIS:  Active Problems:   Anemia, transfused 2 units PRBC 01/06/2013, (heme negative)   Stroke      PAST MEDICAL HISTORY: Past Medical History  Diagnosis Date  . COPD (chronic obstructive pulmonary disease)     home 02  . OSA (obstructive sleep apnea)   . Hypoxemia   . Secondary pulmonary hypertension 8/09    "severe" by echo  . CAD (coronary artery disease) '95,'96,'98.'99,'08    Multiple PCI/stenting all 3 vessels  . Diabetes mellitus type II   . Hyperlipidemia   . Hypertension   . Peripheral vascular disease '96, July 2010    AoIliac stenting with PTA for ISR 7/10  . Tobacco abuse     quit '99  . Constipation   . Anxiety   . Depression   . Artery stenosis     renal   bilateral  . Gastritis 04/2011    mild gastritis on EGD  . NSTEMI (non-ST elevated myocardial infarction) 01/02/13    in setting of PSVT, +Trop, no ECG change, no cath  . Carotid artery disease     bilateral carotid endarterectomies with redo on the left    DISCHARGE MEDICATIONS:   Medication List    STOP taking these medications       metFORMIN 1000 MG tablet  Commonly known as:  GLUCOPHAGE      TAKE these medications       acetaminophen 500 MG tablet  Commonly known as:  TYLENOL  Take 1,000 mg by mouth every 6 (six) hours as needed for mild pain, fever or headache.     albuterol 108 (90 BASE) MCG/ACT inhaler  Commonly known as:  PROAIR HFA  Inhale 2 puffs into the lungs every 4 (four) hours as needed (Cough, wheeze, or chest congestion). 2 puffs  up to 4 times a day as needed     albuterol (2.5 MG/3ML) 0.083% nebulizer solution  Commonly known as:  PROVENTIL  Take 2.5 mg by nebulization every 6 (six) hours as needed. For shortness of breath     aspirin 81 MG EC tablet  Take 1 tablet (81 mg total) by mouth daily.     CARTIA XT 180 MG 24 hr capsule  Generic drug:  diltiazem  Take 180 mg by mouth daily.     clopidogrel 75 MG tablet  Commonly known as:  PLAVIX  Take 75 mg by mouth daily with breakfast.     FLUoxetine 20 MG capsule  Commonly known as:  PROZAC  Take 20 mg by mouth daily.     furosemide 40 MG tablet  Commonly known as:  LASIX  Take 40 mg by mouth 2 (two) times daily.     glimepiride 2 MG tablet  Commonly known as:  AMARYL  TAKE 1 TABLET BY MOUTH DAILY     isosorbide mononitrate 60 MG 24 hr tablet  Commonly known as:  IMDUR  Take 60 mg by mouth daily.     isosorbide mononitrate 30 MG 24 hr tablet  Commonly known as:  IMDUR  Take 30 mg by mouth daily.  metoprolol succinate 50 MG 24 hr tablet  Commonly known as:  TOPROL-XL  Take 50 mg by mouth daily. Take with or immediately following a meal.     MUCUS RELIEF 400 MG Tabs tablet  Generic drug:  guaifenesin  Take 400 mg by mouth 2 (two) times daily as needed. For congestion     MULTIVITAL PO  Take 1 tablet by mouth daily.     nitroGLYCERIN 0.4 MG/SPRAY spray  Commonly known as:  NITROLINGUAL  Place 1 spray under the tongue every 5 (five) minutes x 3 doses as needed for chest pain.     nystatin 100000 UNIT/GM Powd  Apply 1 g topically 2 (two) times daily as needed. rash     oxybutynin 10 MG 24 hr tablet  Commonly known as:  DITROPAN-XL  Take 1 tablet (10 mg total) by mouth daily.     oxymetazoline 0.05 % nasal spray  Commonly known as:  AFRIN  Place 1 spray into both nostrils 2 (two) times daily as needed (for nose bleeds).     pantoprazole 40 MG tablet  Commonly known as:  PROTONIX  Take 40 mg by mouth daily.     pentoxifylline 400 MG  CR tablet  Commonly known as:  TRENTAL  Take 1 by mouth three times day     potassium chloride 10 MEQ CR capsule  Commonly known as:  MICRO-K  Take 20 mEq by mouth 2 (two) times daily.     simvastatin 80 MG tablet  Commonly known as:  ZOCOR  Take 80 mg by mouth daily.     SPIRIVA HANDIHALER 18 MCG inhalation capsule  Generic drug:  tiotropium  INHALE CONTENTS OF 1 CAPSULE VIA HANDIHALER DAILY     tiotropium 18 MCG inhalation capsule  Commonly known as:  SPIRIVA  Place 18 mcg into inhaler and inhale daily.        ALLERGIES:   Allergies  Allergen Reactions  . Contrast Media [Iodinated Diagnostic Agents]   . Iohexol Rash and Other (See Comments)    Fever, unable to stand also    BRIEF HPI:  See H&P, Labs, Consult and Test reports for all details in brief, is a 80 y.o. year-old female with history of COPD, OSA, CAD s/p multiple PCI with stenting to 3 vessels, last NSTEMI in 12/2012 without follow up catheterization secondary to progressive CKD, CKD stage IV, T2DM, PVD, depression/anxiety who presents with progressive fatigue.   CONSULTATIONS:   neurology and vascular surgery  PERTINENT RADIOLOGIC STUDIES: Mr Shirlee Latch Wo Contrast  02/21/2014   CLINICAL DATA:  Fatigue. Dizziness. Off balance for the past month. Diabetic hypertensive hyperlipidemia 80 year old female.  EXAM: MRI HEAD WITHOUT CONTRAST  MRA HEAD WITHOUT CONTRAST  TECHNIQUE: Multiplanar, multiecho pulse sequences of the brain and surrounding structures were obtained without intravenous contrast. Angiographic images of the head were obtained using MRA technique without contrast.  COMPARISON:  12/23/2004.  FINDINGS: MRI HEAD FINDINGS  Small acute nonhemorrhagic infarct posterior left operculum/periatrial region.  Focal area of restricted motion central aspect of the splenium of the corpus callosum. This may reflect changes of subacute infarct given the surrounding findings. Other causes of splenial abnormality such as that  related to medication exposure, toxic exposure or metabolic abnormality are not excluded although secondary considerations.  Prominent white matter type changes most consistent with result of small vessel disease.  Remote tiny cerebellar infarcts larger on the right.  No intracranial hemorrhage.  Mild transverse ligament hypertrophy. C3-4 cervical spondylotic changes  with slight cord flattening.  No hydrocephalus.  MRA HEAD FINDINGS  Narrowing of the cavernous/ supraclinoid segment of the right internal carotid artery may be explained by artifact rather than true narrowing. Otherwise, anterior circulation without medium or large size vessel significant stenosis or occlusion.  Mild middle cerebral artery branch vessel irregularity bilaterally.  Mild narrowing distal left vertebral artery. Minimal narrowing distal right vertebral artery.  Poor delineation/evaluation of the posterior inferior cerebellar arteries.  Large irregular right anterior inferior cerebellar artery. Non visualized left anterior inferior cerebellar artery.  Slight irregularity of the basilar artery without high-grade stenosis.  Moderate tandem stenosis of the superior cerebellar artery bilaterally.  Mild irregularity and narrowing of portions of the posterior cerebral artery bilaterally.  No aneurysm detected.  IMPRESSION: MRI HEAD:  Small acute nonhemorrhagic infarct posterior left operculum/periatrial region.  Focal area of restricted motion central aspect of the splenium of the corpus callosum. This may reflect changes of subacute infarct given the surrounding findings. Other causes of splenial abnormality such as that related to medication exposure, toxic exposure or metabolic abnormality are not excluded although secondary considerations.  Prominent white matter type changes most consistent with result of small vessel disease.  Remote tiny cerebellar infarcts larger on the right.  C3-4 cervical spondylotic changes with slight cord flattening.   MRA HEAD;  Narrowing of the cavernous/ supraclinoid segment of the right internal carotid artery may be explained by artifact rather than true narrowing. Otherwise, anterior circulation without medium or large size vessel significant stenosis or occlusion.  Mild middle cerebral artery branch vessel irregularity bilaterally.  Mild narrowing distal left vertebral artery. Minimal narrowing distal right vertebral artery.  Poor delineation/evaluation of the posterior inferior cerebellar arteries.  Large irregular right anterior inferior cerebellar artery. Non visualized left anterior inferior cerebellar artery.  Slight irregularity of the basilar artery without high-grade stenosis.  Moderate tandem stenosis of the superior cerebellar artery bilaterally.  Mild irregularity and narrowing of portions of the posterior cerebral artery bilaterally.   Electronically Signed   By: Bridgett Larsson M.D.   On: 02/21/2014 12:55   Mr Brain Wo Contrast  02/21/2014   CLINICAL DATA:  Fatigue. Dizziness. Off balance for the past month. Diabetic hypertensive hyperlipidemia 80 year old female.  EXAM: MRI HEAD WITHOUT CONTRAST  MRA HEAD WITHOUT CONTRAST  TECHNIQUE: Multiplanar, multiecho pulse sequences of the brain and surrounding structures were obtained without intravenous contrast. Angiographic images of the head were obtained using MRA technique without contrast.  COMPARISON:  12/23/2004.  FINDINGS: MRI HEAD FINDINGS  Small acute nonhemorrhagic infarct posterior left operculum/periatrial region.  Focal area of restricted motion central aspect of the splenium of the corpus callosum. This may reflect changes of subacute infarct given the surrounding findings. Other causes of splenial abnormality such as that related to medication exposure, toxic exposure or metabolic abnormality are not excluded although secondary considerations.  Prominent white matter type changes most consistent with result of small vessel disease.  Remote tiny  cerebellar infarcts larger on the right.  No intracranial hemorrhage.  Mild transverse ligament hypertrophy. C3-4 cervical spondylotic changes with slight cord flattening.  No hydrocephalus.  MRA HEAD FINDINGS  Narrowing of the cavernous/ supraclinoid segment of the right internal carotid artery may be explained by artifact rather than true narrowing. Otherwise, anterior circulation without medium or large size vessel significant stenosis or occlusion.  Mild middle cerebral artery branch vessel irregularity bilaterally.  Mild narrowing distal left vertebral artery. Minimal narrowing distal right vertebral artery.  Poor delineation/evaluation of the  posterior inferior cerebellar arteries.  Large irregular right anterior inferior cerebellar artery. Non visualized left anterior inferior cerebellar artery.  Slight irregularity of the basilar artery without high-grade stenosis.  Moderate tandem stenosis of the superior cerebellar artery bilaterally.  Mild irregularity and narrowing of portions of the posterior cerebral artery bilaterally.  No aneurysm detected.  IMPRESSION: MRI HEAD:  Small acute nonhemorrhagic infarct posterior left operculum/periatrial region.  Focal area of restricted motion central aspect of the splenium of the corpus callosum. This may reflect changes of subacute infarct given the surrounding findings. Other causes of splenial abnormality such as that related to medication exposure, toxic exposure or metabolic abnormality are not excluded although secondary considerations.  Prominent white matter type changes most consistent with result of small vessel disease.  Remote tiny cerebellar infarcts larger on the right.  C3-4 cervical spondylotic changes with slight cord flattening.  MRA HEAD;  Narrowing of the cavernous/ supraclinoid segment of the right internal carotid artery may be explained by artifact rather than true narrowing. Otherwise, anterior circulation without medium or large size vessel  significant stenosis or occlusion.  Mild middle cerebral artery branch vessel irregularity bilaterally.  Mild narrowing distal left vertebral artery. Minimal narrowing distal right vertebral artery.  Poor delineation/evaluation of the posterior inferior cerebellar arteries.  Large irregular right anterior inferior cerebellar artery. Non visualized left anterior inferior cerebellar artery.  Slight irregularity of the basilar artery without high-grade stenosis.  Moderate tandem stenosis of the superior cerebellar artery bilaterally.  Mild irregularity and narrowing of portions of the posterior cerebral artery bilaterally.   Electronically Signed   By: Bridgett LarssonSteve  Olson M.D.   On: 02/21/2014 12:55     PERTINENT LAB RESULTS: CBC:  Recent Labs  02/20/14 2116 02/21/14 0453  WBC 11.1* 11.0*  HGB 8.5* 8.7*  HCT 26.7* 27.5*  PLT 299 315   CMET CMP     Component Value Date/Time   NA 141 02/21/2014 0453   K 4.2 02/21/2014 0453   CL 102 02/21/2014 0453   CO2 26 02/21/2014 0453   GLUCOSE 112* 02/21/2014 0453   BUN 51* 02/21/2014 0453   CREATININE 2.74* 02/21/2014 0453   CALCIUM 9.9 02/21/2014 0453   PROT 7.3 02/20/2014 1332   ALBUMIN 3.4* 02/20/2014 1332   AST 13 02/20/2014 1332   ALT 8 02/20/2014 1332   ALKPHOS 55 02/20/2014 1332   BILITOT <0.2* 02/20/2014 1332   GFRNONAA 15* 02/21/2014 0453   GFRAA 18* 02/21/2014 0453    GFR Estimated Creatinine Clearance: 14.2 ml/min (by C-G formula based on Cr of 2.74). No results found for this basename: LIPASE, AMYLASE,  in the last 72 hours No results found for this basename: CKTOTAL, CKMB, CKMBINDEX, TROPONINI,  in the last 72 hours No components found with this basename: POCBNP,  No results found for this basename: DDIMER,  in the last 72 hours  Recent Labs  02/20/14 1830  HGBA1C 6.0*    Recent Labs  02/22/14 0641  CHOL 166  HDL 51  LDLCALC 89  TRIG 129  CHOLHDL 3.3    Recent Labs  02/20/14 1707  TSH 1.290    Recent Labs  02/21/14 0453    VITAMINB12 626  FERRITIN 13  TIBC 345  IRON 79   Coags: No results found for this basename: PT, INR,  in the last 72 hours Microbiology: No results found for this or any previous visit (from the past 240 hour(s)).   BRIEF HOSPITAL COURSE:  Acute CVA  -admitted with dizziness/pre-syncope-MRI  brain positive for Acute CVA-not sure this explains her presenting symptoms  -already on ASA,Plavix prior to admission, seen by Neuro recommended to continue. Since has known Left ICA disease, [facility] was consulted and they will arrange for outpatient Carotid Endarterectomy.  -SLP,PT eval completed while inpatient,no further recommendations  Anemia  -chronic issue  -FOBT neg  -likely secondary to CKD  -Transfused 1 unit of PRBC-Hb stable at 8.7 -Will needErythropoetin injections for anemia of CKD. Will defer to PCP   CKD Stage 4  -creatinine close to usual baseline.  -monitor perioddically   CAD  -stable  -continue aspirin, plavix, BB, statin   T2DM  - D/c metformin due to progressive CKD,held glipizide while inpatient, resume on discharge  HTN  -controlled with Metoprolol, Diltiazem   Dyslipidemia  -c/w statins   Depression/anxiety  -stable, continue prozac   Chronic respiratory failure due to COPD  - stable on 2L [place]  TODAY-DAY OF DISCHARGE:  Subjective:   Megan Mathews today has no headache,no chest abdominal pain,no new weakness tingling or numbness, feels much better wants to go home today.   Objective:   Blood pressure 166/71, pulse 73, temperature 98 F (36.7 C), temperature source Oral, resp. rate 16, height 5' (1.524 m), weight 69.2 kg (152 lb 8.9 oz), SpO2 96.00%.  Intake/Output Summary (Last 24 hours) at 02/22/14 1310 Last data filed at 02/22/14 1037  Gross per 24 hour  Intake    243 ml  Output      0 ml  Net    243 ml   Filed Weights   02/20/14 1159 02/21/14 0510 02/22/14 0509  Weight: 63.504 kg (140 lb) 88.2 kg (194 lb 7.1 oz) 69.2 kg (152 lb 8.9 oz)     Exam Awake Alert, Oriented *3, No new F.N deficits, Normal affect [place].AT,PERRAL Supple Neck,No JVD, No cervical lymphadenopathy appriciated.  Symmetrical Chest wall movement, Good air movement bilaterally, CTAB RRR,No Gallops,Rubs or new Murmurs, No Parasternal Heave +ve B.Sounds, Abd Soft, Non tender, No organomegaly appriciated, No rebound -guarding or rigidity. No Cyanosis, Clubbing or edema, No new Rash or bruise  DISCHARGE CONDITION: Stable  DISPOSITION: Home  DISCHARGE INSTRUCTIONS:    Activity:  As tolerated  Diet recommendation: Diabetic Diet Heart Healthy diet      Discharge Instructions   Call MD for:  difficulty breathing, headache or visual disturbances    Complete by:  As directed      Call MD for:  extreme fatigue    Complete by:  As directed      Call MD for:  persistant dizziness or light-headedness    Complete by:  As directed      Diet - low sodium heart healthy    Complete by:  As directed      Diet Carb Modified    Complete by:  As directed      Increase activity slowly    Complete by:  As directed            Follow-up Information   Follow up with Kerby NoraAmy Bedsole, MD On 02/26/2014. (appt at 8:30 am)    Specialty:  Family Medicine   Contact information:   [address] [place] [address] [phone]       Follow up with EARLY, TODD, MD. (office will contact you)    Specialty:  Vascular Surgery   Contact information:   [address] [place] [address] [phone]       Total  Time spent on discharge equals 45 minutes.  SignedJeoffrey Massed 02/22/2014 1:10 PM  **Disclaimer: This note may have been dictated with voice recognition software. Similar sounding words can inadvertently be transcribed and this note may contain transcription errors which may not have been corrected upon publication of note.**

## 2014-02-22 NOTE — Progress Notes (Signed)
  Echocardiogram 2D Echocardiogram has been performed.  Megan Mathews, Megan Mathews 02/22/2014, 11:33 AM

## 2014-02-22 NOTE — Consult Note (Signed)
Patient name: Megan Mathews MRN: [id number] DOB: 08-01-1932 Sex: female   Referred by: Pearlean Brownie  Reason for referral:  Chief Complaint  Patient presents with  . Weakness    HISTORY OF PRESENT ILLNESS: Patient is a very complex 80 year old female admitted with weakness. She was found to have anemia and this was felt to be the cause. She does have an extensive past history of carotid disease. She reports prior right carotid surgery with Dr. Hart Rochester and left carotid surgery with Dr. Madilyn Fireman. In trying to obtain reports of this. These were proximally 15 years ago. She does not have any new focal neurologic deficits. She does have known moderate to severe recurrent stenosis on the left. She had undergone carotid duplex in Dr. Hazle Coca office in April of 2015. At that time she is felt to have no symptoms and is recommended continued observation. On review of the ultrasound she did have 70-99% left carotid stenosis with carotid velocities of 517/70 on the left and to 295/24 on the right.  Her evaluation while in the hospital included MRI which did show a acute or subacute left brain stroke. Discuss this with Dr. Tia Masker. feels that she may benefit from endarterectomy. She does have renal insufficiency and was felt to not be a good candidate for stenting. She is here today with her daughter during my exam for discussion of these issues. She also has known coronary disease. She's had the stenting on multiple occasions dating back to 1995  Past Medical History  Diagnosis Date  . COPD (chronic obstructive pulmonary disease)     home 02  . OSA (obstructive sleep apnea)   . Hypoxemia   . Secondary pulmonary hypertension 8/09    "severe" by echo  . CAD (coronary artery disease) '95,'96,'98.'99,'08    Multiple PCI/stenting all 3 vessels  . Diabetes mellitus type II   . Hyperlipidemia   . Hypertension   . Peripheral vascular disease '96, July 2010    AoIliac stenting with PTA for ISR 7/10  . Tobacco  abuse     quit '99  . Constipation   . Anxiety   . Depression   . Artery stenosis     renal   bilateral  . Gastritis 04/2011    mild gastritis on EGD  . NSTEMI (non-ST elevated myocardial infarction) 01/02/13    in setting of PSVT, +Trop, no ECG change, no cath  . Carotid artery disease     bilateral carotid endarterectomies with redo on the left    Past Surgical History  Procedure Laterality Date  . Carotid endarterectomy      bilat with redo on Lt  . Total abdominal hysterectomy    . Aaa stent  10/96  . Bilateral iliac stenting  10/96, 7/10  . Resection of vocal chord lesions    . Excision serous cyst adenofibroma    . Coronary angioplasty  2/95    RCA  . Coronary angioplasty with stent placement  8/96    RCA  . Coronary angioplasty with stent placement  5/98    RCA X 2  . Coronary angioplasty with stent placement  11/99    CFX,LAD,Dx  . Coronary angiogram  12/08    patent stents, 40% LM  . Thoracotomy Left 1960s    removal of "glandular tumor" (pt's words)    History   Social History  . Marital Status: Widowed    Spouse Name: N/A    Number of Children: N/A  .  Years of Education: N/A   Occupational History  . Not on file.   Social History Main Topics  . Smoking status: Former Smoker -- 1.00 packs/day for 60 years    Types: Cigarettes    Quit date: 10/08/1997  . Smokeless tobacco: Former NeurosurgeonUser  . Alcohol Use: No  . Drug Use: No  . Sexual Activity: No   Other Topics Concern  . Not on file   Social History Narrative   Widowed. Daughters are very supportive - which allows her to live alone. End-of-Life care: discussed living will, HCPOA and MOST form. All are provided to the patient for her to consider with the help of her family. Out of Facilty Order and blank MOST form signed. (07/22/10)    Family History  Problem Relation Age of Onset  . Hypertension Mother   . Hypertension Father     Allergies as of 02/20/2014 - Review Complete 02/20/2014    Allergen Reaction Noted  . Contrast media [iodinated diagnostic agents]  01/04/2013  . Iohexol Rash and Other (See Comments) 07/28/2007    No current facility-administered medications on file prior to encounter.   Current Outpatient Prescriptions on File Prior to Encounter  Medication Sig Dispense Refill  . acetaminophen (TYLENOL) 500 MG tablet Take 1,000 mg by mouth every 6 (six) hours as needed for mild pain, fever or headache.       . albuterol (PROAIR HFA) 108 (90 BASE) MCG/ACT inhaler Inhale 2 puffs into the lungs every 4 (four) hours as needed (Cough, wheeze, or chest congestion). 2 puffs up to 4 times a day as needed  1 Inhaler  5  . albuterol (PROVENTIL) (2.5 MG/3ML) 0.083% nebulizer solution Take 2.5 mg by nebulization every 6 (six) hours as needed. For shortness of breath      . aspirin EC 81 MG EC tablet Take 1 tablet (81 mg total) by mouth daily.      Marland Kitchen. glimepiride (AMARYL) 2 MG tablet TAKE 1 TABLET BY MOUTH DAILY  30 tablet  5  . guaifenesin (MUCUS RELIEF) 400 MG TABS Take 400 mg by mouth 2 (two) times daily as needed. For congestion      . metFORMIN (GLUCOPHAGE) 1000 MG tablet TAKE 1 TABLET BY MOUTH TWICE DAILY WITH A MEAL  180 tablet  3  . Multiple Vitamins-Minerals (MULTIVITAL PO) Take 1 tablet by mouth daily.       Marland Kitchen. oxybutynin (DITROPAN-XL) 10 MG 24 hr tablet Take 1 tablet (10 mg total) by mouth daily.  90 tablet  3  . pentoxifylline (TRENTAL) 400 MG CR tablet Take 1 by mouth three times day      . SPIRIVA HANDIHALER 18 MCG inhalation capsule INHALE CONTENTS OF 1 CAPSULE VIA HANDIHALER DAILY  30 capsule  0  . [DISCONTINUED] oxybutynin (DITROPAN-XL) 10 MG 24 hr tablet Take 1 tablet (10 mg total) by mouth daily.  90 tablet  1     REVIEW OF SYSTEMS:  Positives indicated with an "X"  CARDIOVASCULAR:  [ ]  chest pain   [ ]  chest pressure   [ ]  palpitations   [ ]  orthopnea   [ ]  dyspnea on exertion   [ ]  claudication   [ ]  rest pain   [ ]  DVT   [ ]  phlebitis PULMONARY:   [  ] productive cough   [ ]  asthma   [ ]  wheezing NEUROLOGIC:   [ ]  weakness  [ ]  paresthesias  [ ]  aphasia  [ ]  amaurosis  [ ]   dizziness HEMATOLOGIC:   [ ]  bleeding problems   [ ]  clotting disorders MUSCULOSKELETAL:  [ ]  joint pain   [ ]  joint swelling GASTROINTESTINAL: [ ]   blood in stool  [ ]   hematemesis GENITOURINARY:  [ ]   dysuria  [ ]   hematuria PSYCHIATRIC:  [ ]  history of major depression INTEGUMENTARY:  [ ]  rashes  [ ]  ulcers CONSTITUTIONAL:  [ ]  fever   [ ]  chills  PHYSICAL EXAMINATION:  General: The patient is a well-nourished female, in no acute distress. Vital signs are BP 166/71  Pulse 73  Temp(Src) 98 F (36.7 C) (Oral)  Resp 16  Ht 5' (1.524 m)  Wt 152 lb 8.9 oz (69.2 kg)  BMI 29.79 kg/m2  SpO2 96% Pulmonary: There is a good air exchange    Musculoskeletal: There are no major deformities.  There is no significant extremity pain. Neurologic: No focal weakness or paresthesias are detected, Skin: There are no ulcer or rashes noted. Psychiatric: The patient has normal affect. Cardiovascular: 2+ radial pulses bilaterally Carotid arteries are well-healed incisions bilaterally     Impression and Plan:  I had a long discussion with the patient and her daughter present. She presents very difficult management decision. Significant left carotid restenosis with MRI showing left brain stroke. Does have moderate renal insufficiency. Also has long history of coronary artery disease as well. Agree no need for acute intervention. Will coordinate evaluation with Dr.Berry to optimize her cardiac status. We will see her back in the office for further discussion after this acute event has resolved    EARLY, TODD [facility] Office: [phone]

## 2014-02-22 NOTE — Progress Notes (Signed)
Nsg Discharge Note  Admit Date:  02/20/2014 Discharge date: 02/22/2014   Melissa Tressia DanasL Isley to be D/C'd Home per MD order.  AVS completed.  Copy for chart, and copy for patient signed, and dated. Patient/caregiver able to verbalize understanding.  Discharge Medication:   Medication List    STOP taking these medications       metFORMIN 1000 MG tablet  Commonly known as:  GLUCOPHAGE      TAKE these medications       acetaminophen 500 MG tablet  Commonly known as:  TYLENOL  Take 1,000 mg by mouth every 6 (six) hours as needed for mild pain, fever or headache.     albuterol 108 (90 BASE) MCG/ACT inhaler  Commonly known as:  PROAIR HFA  Inhale 2 puffs into the lungs every 4 (four) hours as needed (Cough, wheeze, or chest congestion). 2 puffs up to 4 times a day as needed     albuterol (2.5 MG/3ML) 0.083% nebulizer solution  Commonly known as:  PROVENTIL  Take 2.5 mg by nebulization every 6 (six) hours as needed. For shortness of breath     aspirin 81 MG EC tablet  Take 1 tablet (81 mg total) by mouth daily.     CARTIA XT 180 MG 24 hr capsule  Generic drug:  diltiazem  Take 180 mg by mouth daily.     clopidogrel 75 MG tablet  Commonly known as:  PLAVIX  Take 75 mg by mouth daily with breakfast.     FLUoxetine 20 MG capsule  Commonly known as:  PROZAC  Take 20 mg by mouth daily.     furosemide 40 MG tablet  Commonly known as:  LASIX  Take 40 mg by mouth 2 (two) times daily.     glimepiride 2 MG tablet  Commonly known as:  AMARYL  TAKE 1 TABLET BY MOUTH DAILY     isosorbide mononitrate 60 MG 24 hr tablet  Commonly known as:  IMDUR  Take 60 mg by mouth daily.     isosorbide mononitrate 30 MG 24 hr tablet  Commonly known as:  IMDUR  Take 30 mg by mouth daily.     metoprolol succinate 50 MG 24 hr tablet  Commonly known as:  TOPROL-XL  Take 50 mg by mouth daily. Take with or immediately following a meal.     MUCUS RELIEF 400 MG Tabs tablet  Generic drug:   guaifenesin  Take 400 mg by mouth 2 (two) times daily as needed. For congestion     MULTIVITAL PO  Take 1 tablet by mouth daily.     nitroGLYCERIN 0.4 MG/SPRAY spray  Commonly known as:  NITROLINGUAL  Place 1 spray under the tongue every 5 (five) minutes x 3 doses as needed for chest pain.     nystatin 100000 UNIT/GM Powd  Apply 1 g topically 2 (two) times daily as needed. rash     oxybutynin 10 MG 24 hr tablet  Commonly known as:  DITROPAN-XL  Take 1 tablet (10 mg total) by mouth daily.     oxymetazoline 0.05 % nasal spray  Commonly known as:  AFRIN  Place 1 spray into both nostrils 2 (two) times daily as needed (for nose bleeds).     pantoprazole 40 MG tablet  Commonly known as:  PROTONIX  Take 40 mg by mouth daily.     pentoxifylline 400 MG CR tablet  Commonly known as:  TRENTAL  Take 1 by mouth three times day  potassium chloride 10 MEQ CR capsule  Commonly known as:  MICRO-K  Take 20 mEq by mouth 2 (two) times daily.     simvastatin 80 MG tablet  Commonly known as:  ZOCOR  Take 80 mg by mouth daily.     SPIRIVA HANDIHALER 18 MCG inhalation capsule  Generic drug:  tiotropium  INHALE CONTENTS OF 1 CAPSULE VIA HANDIHALER DAILY     tiotropium 18 MCG inhalation capsule  Commonly known as:  SPIRIVA  Place 18 mcg into inhaler and inhale daily.        Discharge Assessment: Filed Vitals:   02/22/14 1047  BP: 166/71  Pulse:   Temp:   Resp:    Skin clean, dry and intact without evidence of skin break down, no evidence of skin tears noted. IV catheter discontinued intact. Site without signs and symptoms of complications - no redness or edema noted at insertion site, patient denies c/o pain - only slight tenderness at site.  Dressing with slight pressure applied.  D/c Instructions-Education: Discharge instructions given to patient/family with verbalized understanding. D/c education completed with patient/family including follow up instructions, medication list,  d/c activities limitations if indicated, with other d/c instructions as indicated by MD - patient able to verbalize understanding, all questions fully answered. Patient instructed to return to ED, call 911, or call MD for any changes in condition.  Patient escorted via WC, and D/C home via private auto.  Gleason, Ginger Consuella Loselaine, RN 02/22/2014 2:10 PM

## 2014-02-22 NOTE — Plan of Care (Signed)
Problem: Consults Goal: Ischemic Stroke Patient Education See Patient Education Module for education specifics. Outcome: Completed/Met Date Met:  02/22/14 Pt provided with easy to read Stroke Prevention material

## 2014-02-22 NOTE — Evaluation (Signed)
Clinical/Bedside Swallow Evaluation Patient Details  Name: Megan Mathews MRN: [id number] Date of Birth: 02/16/1932  Today's Date: 02/22/2014 Time: [id number] SLP Time Calculation (min): 17 min  Past Medical History:  Past Medical History  Diagnosis Date  . COPD (chronic obstructive pulmonary disease)     home 02  . OSA (obstructive sleep apnea)   . Hypoxemia   . Secondary pulmonary hypertension 8/09    "severe" by echo  . CAD (coronary artery disease) '95,'96,'98.'99,'08    Multiple PCI/stenting all 3 vessels  . Diabetes mellitus type II   . Hyperlipidemia   . Hypertension   . Peripheral vascular disease '96, July 2010    AoIliac stenting with PTA for ISR 7/10  . Tobacco abuse     quit '99  . Constipation   . Anxiety   . Depression   . Artery stenosis     renal   bilateral  . Gastritis 04/2011    mild gastritis on EGD  . NSTEMI (non-ST elevated myocardial infarction) 01/02/13    in setting of PSVT, +Trop, no ECG change, no cath  . Carotid artery disease     bilateral carotid endarterectomies with redo on the left   Past Surgical History:  Past Surgical History  Procedure Laterality Date  . Carotid endarterectomy      bilat with redo on Lt  . Total abdominal hysterectomy    . Aaa stent  10/96  . Bilateral iliac stenting  10/96, 7/10  . Resection of vocal chord lesions    . Excision serous cyst adenofibroma    . Coronary angioplasty  2/95    RCA  . Coronary angioplasty with stent placement  8/96    RCA  . Coronary angioplasty with stent placement  5/98    RCA X 2  . Coronary angioplasty with stent placement  11/99    CFX,LAD,Dx  . Coronary angiogram  12/08    patent stents, 40% LM  . Thoracotomy Left 1960s    removal of "glandular tumor" (pt's words)   HPI:  80 y.o. year-old female with history of COPD, OSA, CAD, last NSTEMI in 12/2012, CKD stage IV, T2DM, PVD, depression/anxiety who presents with progressive fatigue and decreased balance.  MRI Small acute  nonhemorrhagic infarct posterior left operculum/periatrial region and C3-4 cervical spondylotic changes with slight cord flattening.  CXR 01/2013 found underlying COPD.   Assessment / Plan / Recommendation Clinical Impression  Pt. exhibits functional oropharyngeal swallow function.  She complained of "strangling" on her Metformin at home several times 2 weeks ago perhaps due to initiation of CVA.  No indications of penetration or aspiration or laryngeal residue.  Given recent infarct, COPD and C3-4 spondylotic changes per prior documentation, she is at a higher risk of phraryngeal dysphagia. SLP discussed and educated pt. on general swallow precautions in attempts to prevent difficulty.  Expalined if symptoms persists, pt. should notify family MD for further assessment with ST.  Continue regular texture and thin liquids, straws ok, advised to consume larger pills with applesauce, yogurt or pudding.  No f/u recommended.     Aspiration Risk  Moderate    Diet Recommendation Regular;Thin liquid   Liquid Administration via: Cup;Straw Medication Administration: Whole meds with liquid (applesauce whole if pill is large) Supervision: Patient able to self feed Compensations: Slow rate;Small sips/bites Postural Changes and/or Swallow Maneuvers: Seated upright 90 degrees;Upright 30-60 min after meal    Other  Recommendations Oral Care Recommendations: Oral care BID   Follow Up Recommendations  None    Frequency and Duration        Pertinent Vitals/Pain WDL    SLP Swallow Goals     Swallow Study          Oral/Motor/Sensory Function Overall Oral Motor/Sensory Function: Appears within functional limits for tasks assessed   Ice Chips Ice chips: Not tested   Thin Liquid Thin Liquid: Within functional limits Presentation: Cup;Straw    Nectar Thick Nectar Thick Liquid: Not tested   Honey Thick Honey Thick Liquid: Not tested   Puree Puree: Not tested   Solid   GO    Solid: Within  functional limits       Breck CoonsLisa Willis [employer] M.Ed [employer] Pager [phone]  02/22/2014

## 2014-02-25 ENCOUNTER — Telehealth: Payer: Self-pay | Admitting: Vascular Surgery

## 2014-02-25 LAB — UIFE/LIGHT CHAINS/TP QN, 24-HR UR
ALBUMIN, U: DETECTED
ALPHA 1 UR: DETECTED — AB
ALPHA 2 UR: DETECTED — AB
Beta, Urine: DETECTED — AB
FREE KAPPA/LAMBDA RATIO: 5.64 ratio (ref 2.04–10.37)
Free Kappa Lt Chains,Ur: 8.69 mg/dL — ABNORMAL HIGH (ref 0.14–2.42)
Free Lambda Lt Chains,Ur: 1.54 mg/dL — ABNORMAL HIGH (ref 0.02–0.67)
Gamma Globulin, Urine: DETECTED — AB
Total Protein, Urine: 57.1 mg/dL

## 2014-02-25 LAB — PROTEIN ELECTROPHORESIS, SERUM
ALPHA-2-GLOBULIN: 18.5 % — AB (ref 7.1–11.8)
Albumin ELP: 51.7 % — ABNORMAL LOW (ref 55.8–66.1)
Alpha-1-Globulin: 6.3 % — ABNORMAL HIGH (ref 2.9–4.9)
BETA 2: 5.2 % (ref 3.2–6.5)
BETA GLOBULIN: 7.4 % — AB (ref 4.7–7.2)
Gamma Globulin: 10.9 % — ABNORMAL LOW (ref 11.1–18.8)
M-SPIKE, %: NOT DETECTED g/dL
TOTAL PROTEIN ELP: 6.1 g/dL (ref 6.0–8.3)

## 2014-02-25 NOTE — Telephone Encounter (Signed)
3:36pm- spoke with Megan Mathews. She wrote down all her appointment information and said that she would have her daughter call if anything needed to be rescheduled, dpm

## 2014-02-25 NOTE — Telephone Encounter (Signed)
Left message for Nakyra to call back re:  1. Appointment with Dr Renelda MomBerrys PA, [place] on 03/05/14 @ 1130 2. Appointment with Dr Hart RochesterLawson on 03/12/14 @ 830am

## 2014-02-25 NOTE — Telephone Encounter (Signed)
Message copied by Fredrich BirksMILLIKAN, DANA P on Mon Feb 25, 2014  2:01 PM ------      Message from: Sharee PimpleMCCHESNEY, MARILYN K      Created: Fri Feb 22, 2014  3:35 PM      Regarding: Schedule       i put in referral for cardiac clearance             ----- Message -----         From: Larina Earthlyodd F Early, MD         Sent: 02/22/2014  12:53 PM           To: [facility] Charge Pool            Level IV consult. The patient will be discharged today. The patient needs referral to Dr. York RamJonathan Barry cardiac evaluation and also evaluation of her carotid disease. Please schedule this appointment with Dr. Allyson SabalBerry. Also needs followup with Dr. Hart RochesterLawson in the office in 2-3 weeks for further discussion of her carotid disease       ------

## 2014-02-26 ENCOUNTER — Encounter: Payer: Self-pay | Admitting: Family Medicine

## 2014-02-26 ENCOUNTER — Ambulatory Visit (INDEPENDENT_AMBULATORY_CARE_PROVIDER_SITE_OTHER): Payer: Medicare Other | Admitting: Family Medicine

## 2014-02-26 ENCOUNTER — Telehealth: Payer: Self-pay | Admitting: Family Medicine

## 2014-02-26 VITALS — BP 140/72 | HR 58 | Temp 98.2°F | Ht 60.0 in | Wt 154.0 lb

## 2014-02-26 DIAGNOSIS — I63239 Cerebral infarction due to unspecified occlusion or stenosis of unspecified carotid arteries: Secondary | ICD-10-CM

## 2014-02-26 DIAGNOSIS — I779 Disorder of arteries and arterioles, unspecified: Secondary | ICD-10-CM

## 2014-02-26 DIAGNOSIS — I10 Essential (primary) hypertension: Secondary | ICD-10-CM

## 2014-02-26 DIAGNOSIS — I739 Peripheral vascular disease, unspecified: Secondary | ICD-10-CM

## 2014-02-26 DIAGNOSIS — I2510 Atherosclerotic heart disease of native coronary artery without angina pectoris: Secondary | ICD-10-CM

## 2014-02-26 DIAGNOSIS — N184 Chronic kidney disease, stage 4 (severe): Secondary | ICD-10-CM

## 2014-02-26 DIAGNOSIS — K5900 Constipation, unspecified: Secondary | ICD-10-CM

## 2014-02-26 DIAGNOSIS — K5904 Chronic idiopathic constipation: Secondary | ICD-10-CM

## 2014-02-26 DIAGNOSIS — E1149 Type 2 diabetes mellitus with other diabetic neurological complication: Secondary | ICD-10-CM

## 2014-02-26 DIAGNOSIS — D638 Anemia in other chronic diseases classified elsewhere: Secondary | ICD-10-CM

## 2014-02-26 LAB — COMPREHENSIVE METABOLIC PANEL
ALT: 14 U/L (ref 0–35)
AST: 14 U/L (ref 0–37)
Albumin: 3.3 g/dL — ABNORMAL LOW (ref 3.5–5.2)
Alkaline Phosphatase: 36 U/L — ABNORMAL LOW (ref 39–117)
BUN: 58 mg/dL — AB (ref 6–23)
CHLORIDE: 106 meq/L (ref 96–112)
CO2: 28 meq/L (ref 19–32)
CREATININE: 2.3 mg/dL — AB (ref 0.4–1.2)
Calcium: 8.9 mg/dL (ref 8.4–10.5)
GFR: 21.9 mL/min — AB (ref >60.00)
Glucose, Bld: 172 mg/dL — ABNORMAL HIGH (ref 70–99)
Potassium: 3.8 mEq/L (ref 3.5–5.1)
Sodium: 141 mEq/L (ref 135–145)
Total Bilirubin: 0.4 mg/dL (ref 0.2–1.2)
Total Protein: 6.4 g/dL (ref 6.0–8.3)

## 2014-02-26 LAB — CBC WITH DIFFERENTIAL/PLATELET
Basophils Absolute: 0 10*3/uL (ref 0.0–0.1)
Basophils Relative: 0 % (ref 0.0–3.0)
EOS ABS: 0 10*3/uL (ref 0.0–0.7)
Eosinophils Relative: 0 % (ref 0.0–5.0)
HEMATOCRIT: 27.2 % — AB (ref 36.0–46.0)
Hemoglobin: 8.8 g/dL — ABNORMAL LOW (ref 12.0–15.0)
LYMPHS ABS: 1 10*3/uL (ref 0.7–4.0)
LYMPHS PCT: 6 % — AB (ref 12.0–46.0)
MCHC: 32.1 g/dL (ref 30.0–36.0)
MCV: 90 fl (ref 78.0–100.0)
Monocytes Absolute: 1.3 10*3/uL — ABNORMAL HIGH (ref 0.1–1.0)
Monocytes Relative: 7.7 % (ref 3.0–12.0)
Neutro Abs: 15 10*3/uL — ABNORMAL HIGH (ref 1.4–7.7)
Neutrophils Relative %: 86.3 % — ABNORMAL HIGH (ref 43.0–77.0)
Platelets: 362 10*3/uL (ref 150.0–400.0)
RBC: 3.03 Mil/uL — AB (ref 3.87–5.11)
RDW: 14.5 % (ref 11.5–15.5)
WBC: 17.4 10*3/uL — ABNORMAL HIGH (ref 4.0–10.5)

## 2014-02-26 MED ORDER — GLUCOSE BLOOD VI STRP: ORAL_STRIP | Status: DC

## 2014-02-26 MED ORDER — AMOXICILLIN 500 MG PO CAPS: 500.0000 mg | ORAL_CAPSULE | Freq: Two times a day (BID) | ORAL | Status: DC

## 2014-02-26 NOTE — Telephone Encounter (Signed)
No COPD exac. Will treat for sinus infection with 10 days of amoxicillin. Low dose given renal function

## 2014-02-26 NOTE — Patient Instructions (Addendum)
Stay off alprazolam.  Continue mucus relief at night and can try zyrtec at bedtime. Stop metformin.  Follow blood sugars at home, call if greater than 200. Call if sleep issues recur.  Consider kidney doctor.  I will look into erythropoietin injections at home.  Follow up in 2-3 weeks 30 min.  Call sooner if new issues.  Stop at lab on way out.

## 2014-02-26 NOTE — Telephone Encounter (Signed)
Sorry.. Should be #20 given she has renal issues.  My quick order button is set for #40 . It was a mistake. Thanks for catching. Please clarify.

## 2014-02-26 NOTE — Telephone Encounter (Signed)
Message copied by Excell SeltzerBEDSOLE, AMY E on Tue Feb 26, 2014  3:59 PM ------      Message from: Damita LackLORING, DONNA S      Created: Tue Feb 26, 2014  3:12 PM       Lupita LeashDonna (daughter) notified as instructed by telephone.  Ms. Julien GirtMcDaniel has not noticed any change in her cough or color of sputum.  No SOB but does state numbness in face like someone has hit her.  Lupita LeashDonna also mentioned her mom has felt feverish and has had chills.  Please advise. ------

## 2014-02-26 NOTE — Telephone Encounter (Signed)
Looks like you sent Amoxicillin 500 mg to take two times a day but you sent in #40.  Does something need to be changed? Lupita LeashDonna (daughter) notified prescription for Amoxicillin has been sent to Ms. Cutrone's pharmacy.

## 2014-02-26 NOTE — Progress Notes (Signed)
Subjective:    Patient ID: Megan Mathews, female    DOB: 10/28/1931, 80 y.o.   MRN: [id number]  HPI 80 year old female previous pt of Dr. Debby Budnorins presents to transfer care. She is accompanied by one of her daughters. She last saw Dr. Debby BudNorins in 11/2013 for well controlled anemia Hg 9.4 and DM. No changes made to regimen at that time.  She had BP elevation in 01/2014.  BP 250/150. Refused to go to ER.  She was admitted to hospital  on 6/17  To 6/19 for progressive fatigue and  jitteriness. Admmitted for Hg 7.9. Given 2 unit prbc. She was found to be deficient in erythropoietin due to CKD. Recommend recheck CBC/ CMET. Last Creatinine in Hospital 2.74.  Acute CVA  -admitted with dizziness/pre-syncope-MRI brain positive for Acute CVA-not sure this explains her presenting symptoms  -already on ASA,Plavix prior to admission, seen by Neuro recommended to continue. Since has known Left ICA disease, [facility]VS was consulted and they will  Follow up  With Dr. Allyson SabalBerry next week, also has follow up with Dr. Hart RochesterLawson, vascular after that. -SLP,PT eval completed while inpatient,no further recommendations  Anemia  -chronic issue  -FOBT neg  -likely secondary to CKD  -Transfused 1 unit of PRBC-Hb stable at 8.7  -Will needErythropoetin injections for anemia of CKD. Will defer to PCP  CKD Stage 4  -creatinine close to usual baseline.  -monitor perioddically  CAD  -stable  -continue aspirin, plavix, BB, statin  T2DM  - D/c metformin due to progressive CKD,held glipizide while inpatient, resume on discharge  ( she was not aware of need to stop metformin) HTN  -controlled with Metoprolol, Diltiazem  Dyslipidemia  -c/w statins  Depression/anxiety  -stable, continue prozac  Chronic respiratory failure due to COPD  - stable on 2L [place]     MRI brain:IMPRESSION: MRI HEAD: Small acute nonhemorrhagic infarct posterior left operculum/periatrial region. Focal area of restricted motion central aspect of the splenium  of the corpus callosum. This may reflect changes of subacute infarct given the surrounding findings. Other causes of splenial abnormality such as that related to medication exposure, toxic exposure or metabolic abnormality are not excluded although secondary considerations. Prominent white matter type changes most consistent with result of small vessel disease. Remote tiny cerebellar infarcts larger on the right. C3-4 cervical spondylotic changes with slight cord flattening.      She does not sleep well at night.  Has used alprazolam in past for sleep but has been out since 10/2013.  She has significant congestion lately.  Uses mucus relief, has increased to 2 at night. Sneeze. Clear discharge from eytes.  Also followed by cardiology by Dr. Allyson SabalBerry and Dr. Arbie CookeyEarly for carotid stenosis with hx of endarterectomy. He also fololws her history of CAD and PVOD. He catheterized her in November 2008 revealing a 40% tapered distal left main stenosis but otherwise noncritical CAD. She did have patent stents to her LAD, circumflex and mid and distal RCA with normal LV function. She has known renal artery stenosis right greater than left and has had distal abdominal aorta and bilateral iliac stenting which were patent at the time of her catheterization. She underwent reintervention in  March 30, 2009. She has home O2 and lives a fairly minimal functional existence, mostly walking around her small apartment. She has complained of progressive nitrate-responsive angina  At BPs 120/70 BP Readings from Last 3 Encounters:  02/26/14 140/72  02/22/14 166/71  01/09/14 125/56   FBS 120-170  Lab Results  Component Value Date   HGBA1C 6.0* 02/20/2014    She is eating better now. Wt Readings from Last 3 Encounters:  02/26/14 154 lb (69.854 kg)  02/22/14 152 lb 8.9 oz (69.2 kg)  01/09/14 151 lb 1.6 oz (68.539 kg)     Review of Systems  Constitutional: Negative for fever and fatigue.  HENT: Positive for postnasal  drip, rhinorrhea and sinus pressure. Negative for ear pain.   Eyes: Negative for pain and redness.  Respiratory: Negative for cough and shortness of breath.   Cardiovascular: Negative for chest pain and palpitations.  Gastrointestinal: Negative for abdominal pain.  Genitourinary: Negative for dysuria and hematuria.  Neurological: Positive for headaches.       Objective:   Physical Exam  Constitutional: She is oriented to person, place, and time. Vital signs are normal. She appears well-developed and well-nourished. She is cooperative.  Non-toxic appearance. She does not appear ill. No distress.  Elderly female in NAD  on nasal cannula  HENT:  Head: Normocephalic.  Right Ear: Hearing, tympanic membrane, external ear and ear canal normal.  Left Ear: Hearing, tympanic membrane, external ear and ear canal normal.  Nose: Nose normal.  Eyes: Conjunctivae, EOM and lids are normal. Pupils are equal, round, and reactive to light. Lids are everted and swept, no foreign bodies found.  Neck: Trachea normal and normal range of motion. Neck supple. Carotid bruit is not present. No mass and no thyromegaly present.  Cardiovascular: Normal rate, regular rhythm, S1 normal, S2 normal, normal heart sounds and intact distal pulses.  Exam reveals no gallop.   No murmur heard. No peripheral edemea  B bruit, harsh   Pulmonary/Chest: Effort normal and breath sounds normal. No respiratory distress. She has no wheezes. She has no rhonchi. She has no rales.  Abdominal: Soft. Normal appearance and bowel sounds are normal. She exhibits no distension, no fluid wave, no abdominal bruit and no mass. There is no hepatosplenomegaly. There is no tenderness. There is no rebound, no guarding and no CVA tenderness. No hernia.  Lymphadenopathy:    She has no cervical adenopathy.    She has no axillary adenopathy.  Neurological: She is alert and oriented to person, place, and time. She has normal strength. No cranial nerve  deficit or sensory deficit.  Skin: Skin is warm, dry and intact. No rash noted.  Psychiatric: Her speech is normal and behavior is normal. Judgment normal. Her mood appears not anxious. Cognition and memory are normal. She does not exhibit a depressed mood.          Assessment & Plan:

## 2014-02-26 NOTE — Addendum Note (Signed)
Addended by: Damita LackLORING, DONNA S on: 02/26/2014 04:37 PM   Modules accepted: Orders

## 2014-02-26 NOTE — Progress Notes (Signed)
Pre visit review using our clinic review tool, if applicable. No additional management support is needed unless otherwise documented below in the visit note. 

## 2014-02-26 NOTE — Telephone Encounter (Signed)
[id number]  Also resent prescription with corrected number of tablets to dispense.

## 2014-02-27 ENCOUNTER — Encounter: Payer: Self-pay | Admitting: *Deleted

## 2014-03-05 ENCOUNTER — Ambulatory Visit: Payer: Medicare Other | Admitting: Family Medicine

## 2014-03-05 ENCOUNTER — Ambulatory Visit (INDEPENDENT_AMBULATORY_CARE_PROVIDER_SITE_OTHER): Payer: Medicare Other | Admitting: Cardiology

## 2014-03-05 ENCOUNTER — Encounter: Payer: Self-pay | Admitting: Cardiology

## 2014-03-05 VITALS — BP 138/58 | HR 51 | Ht 60.0 in | Wt 153.4 lb

## 2014-03-05 DIAGNOSIS — I739 Peripheral vascular disease, unspecified: Secondary | ICD-10-CM

## 2014-03-05 DIAGNOSIS — I2510 Atherosclerotic heart disease of native coronary artery without angina pectoris: Secondary | ICD-10-CM

## 2014-03-05 NOTE — Patient Instructions (Signed)
Your physician recommends that you schedule a follow-up appointment in: 6 Month PA

## 2014-03-10 NOTE — Progress Notes (Signed)
Patient ID: Megan Mathews, female   DOB: 1931-10-07, 80 y.o.   MRN: [id number]    03/10/2014 Deaun L Losee   [birth year]  [id number]  Primary Physicia Kerby Nora, MD Primary Cardiologist: Dr. Allyson Sabal  HPI:  The patient is an 80 year old female, followed by Dr. Gery Pray who presents to clinic today for post hospital followup, as well as surgical clearance for possible left carotid endarterectomy. She has been followed long-term by Dr. Allyson Sabal for over 25 years. She has a history of CAD and PVOD. Dr. Allyson Sabal catheterized her in November 2008 revealing a 40% tapered distal left main stenosis but otherwise noncritical CAD. She did have patent stents to her LAD, circumflex and mid and distal RCA with normal LV function. She has known renal artery stenosis right greater than left and has had distal abdominal aorta and bilateral iliac stenting which were patent at the time of her catheterization. She underwent reintervention on March 30, 2009. Her last Dopplers performed a year and a half ago showed progressive in-stent restenosis and she does complain of lifestyle-limiting claudication. She has also had bilateral carotid endarterectomies with re-do on the left. We have been following her carotid Dopplers as well. Her other problems include hypertension, hyperlipidemia, insulin-dependent diabetes and GERD. She has home O2 and lives a fairly minimal functional existence, mostly walking around her small apartment.   She was recently admitted to [facility] for weakness/dizziness and was found to be anemic. She was treated by internal medicine. Workup also included a MRI of the brain which was positive for acute CVA. Given her known left ICA disease, VVS was consult and it was felt that she would benefit from carotid endarterectomy. Once medically stable, she was discharged home on 02/22/2014.  Since discharge, she states that she has been doing fairly well. She denies any further weakness. On occasion she does feel  a bit dizzy, particularly with change in positions. However, she denies syncope/near-syncope. She does admit to one episode of chest pain that woke her from her sleep and was relieved after one dose of sublingual nitroglycerin. She denies any recurrent chest pain since that time. Her main concern at this point, is her worsening renal function and the possibility of having to go on hemodialysis. Her serum creatinine during her recent hospitalization was as high as 2.82. She wishes to avoid any medical procedures that may further jeopardize renal function. She tells me that given her age and multiple medical problems, she wishes to avoid any aggressive medical therapies. She is scheduled to meet with Dr. Hart Rochester in several weeks regarding the possibility of carotid endarterectomy and wishes to discuss the absolute need for this procedure.   Current Outpatient Prescriptions  Medication Sig Dispense Refill  . acetaminophen (TYLENOL) 500 MG tablet Take 1,000 mg by mouth every 6 (six) hours as needed for mild pain, fever or headache.       . albuterol (PROAIR HFA) 108 (90 BASE) MCG/ACT inhaler Inhale 2 puffs into the lungs every 4 (four) hours as needed (Cough, wheeze, or chest congestion). 2 puffs up to 4 times a day as needed  1 Inhaler  5  . albuterol (PROVENTIL) (2.5 MG/3ML) 0.083% nebulizer solution Take 2.5 mg by nebulization every 6 (six) hours as needed. For shortness of breath      . amoxicillin (AMOXIL) 500 MG capsule Take 1 capsule (500 mg total) by mouth 2 (two) times daily.  20 capsule  0  . aspirin EC 81 MG EC tablet Take  1 tablet (81 mg total) by mouth daily.      . clopidogrel (PLAVIX) 75 MG tablet Take 75 mg by mouth daily with breakfast.      . diltiazem (CARTIA XT) 180 MG 24 hr capsule Take 180 mg by mouth daily.      Marland Kitchen. FLUoxetine (PROZAC) 20 MG capsule Take 20 mg by mouth daily.      . furosemide (LASIX) 40 MG tablet Take 40 mg by mouth 2 (two) times daily.      Marland Kitchen. glimepiride (AMARYL) 2 MG  tablet TAKE 1 TABLET BY MOUTH DAILY  30 tablet  5  . glucose blood test strip Use to check blood sugar 1 to 2 times a day.  Dx 250.60  100 each  5  . guaifenesin (MUCUS RELIEF) 400 MG TABS Take 400 mg by mouth 2 (two) times daily as needed. For congestion      . isosorbide mononitrate (IMDUR) 30 MG 24 hr tablet Take 30 mg by mouth daily.      . isosorbide mononitrate (IMDUR) 60 MG 24 hr tablet Take 60 mg by mouth daily.      . metoprolol succinate (TOPROL-XL) 50 MG 24 hr tablet Take 50 mg by mouth daily. Take with or immediately following a meal.      . Multiple Vitamins-Minerals (MULTIVITAL PO) Take 1 tablet by mouth daily.       . nitroGLYCERIN (NITROLINGUAL) 0.4 MG/SPRAY spray Place 1 spray under the tongue every 5 (five) minutes x 3 doses as needed for chest pain.      Marland Kitchen. nystatin (MYCOSTATIN/NYSTOP) 100000 UNIT/GM POWD Apply 1 g topically 2 (two) times daily as needed. rash      . oxybutynin (DITROPAN-XL) 10 MG 24 hr tablet Take 1 tablet (10 mg total) by mouth daily.  90 tablet  3  . oxymetazoline (AFRIN) 0.05 % nasal spray Place 1 spray into both nostrils 2 (two) times daily as needed (for nose bleeds).      . pantoprazole (PROTONIX) 40 MG tablet Take 40 mg by mouth daily.      . pentoxifylline (TRENTAL) 400 MG CR tablet Take 1 by mouth three times day      . potassium chloride (MICRO-K) 10 MEQ CR capsule Take 20 mEq by mouth 2 (two) times daily.      . simvastatin (ZOCOR) 80 MG tablet Take 80 mg by mouth daily.      Marland Kitchen. SPIRIVA HANDIHALER 18 MCG inhalation capsule INHALE CONTENTS OF 1 CAPSULE VIA HANDIHALER DAILY  30 capsule  0  . tiotropium (SPIRIVA) 18 MCG inhalation capsule Place 18 mcg into inhaler and inhale daily.      . [DISCONTINUED] oxybutynin (DITROPAN-XL) 10 MG 24 hr tablet Take 1 tablet (10 mg total) by mouth daily.  90 tablet  1   No current facility-administered medications for this visit.    Allergies  Allergen Reactions  . Contrast Media [Iodinated Diagnostic Agents]   .  Iohexol Rash and Other (See Comments)    Fever, unable to stand also    History   Social History  . Marital Status: Widowed    Spouse Name: N/A    Number of Children: N/A  . Years of Education: N/A   Occupational History  . Not on file.   Social History Main Topics  . Smoking status: Former Smoker -- 1.00 packs/day for 60 years    Types: Cigarettes    Quit date: 10/08/1997  . Smokeless tobacco: Former NeurosurgeonUser  .  Alcohol Use: No  . Drug Use: No  . Sexual Activity: No   Other Topics Concern  . Not on file   Social History Narrative   Widowed. Daughters are very supportive - which allows her to live alone. End-of-Life care: discussed living will, HCPOA and MOST form. All are provided to the patient for her to consider with the help of her family. Out of Facilty Order and blank MOST form signed. (07/22/10)     Review of Systems: General: negative for chills, fever, night sweats or weight changes.  Cardiovascular: negative for chest pain, dyspnea on exertion, edema, orthopnea, palpitations, paroxysmal nocturnal dyspnea or shortness of breath Dermatological: negative for rash Respiratory: negative for cough or wheezing Urologic: negative for hematuria Abdominal: negative for nausea, vomiting, diarrhea, bright red blood per rectum, melena, or hematemesis Neurologic: negative for visual changes, syncope, or dizziness All other systems reviewed and are otherwise negative except as noted above.    Blood pressure 138/58, pulse 51, height 5' (1.524 m), weight 153 lb 6.4 oz (69.582 kg).  General appearance: alert, cooperative and no distress Neck: no JVD and Bilateral carotid bruits Lungs: clear to auscultation bilaterally Heart: regular rate and rhythm, S1, S2 normal, no murmur, click, rub or gallop and regular rate and rhythm Extremities: No LEE Pulses: 2+ and symmetric Skin: warm and dry Neurologic: Grossly normal   ASSESSMENT AND PLAN:   1. left carotid artery disease:  Patient is scheduled to meet with Dr. Hart RochesterLawson in several weeks to discuss potential left carotid endarterectomy. Her last heart catheterization was in 2008, revealing a 40% tapered distal left main stenosis but otherwise noncritical CAD. She did have patent stents to her LAD, circumflex and mid distal RCA with normal LV function. Since that time, she has done fairly well however she did note one episode of chest pain that awoke her from her sleep several weeks ago, that was also relieved with sublingual nitroglycerin. However, she denies any further recurrence of chest pain since that time. The patient tells me that at this point, she is unsure whether or not she wants to go anymore elective procedures or aggressive therapies. She wishes to discuss this further with Dr. Hart RochesterLawson before making a final decision. In the meantime, I will discuss case with Dr. Gery PrayBarry, her primary cardiologist, to help us decide whether or not she would be okay to undergo surgery.    Damontae Loppnow, BRITTAINYPA-C 03/10/2014 3:49 PM

## 2014-03-12 ENCOUNTER — Ambulatory Visit: Payer: Medicare Other | Admitting: Vascular Surgery

## 2014-03-18 ENCOUNTER — Encounter: Payer: Self-pay | Admitting: Vascular Surgery

## 2014-03-19 ENCOUNTER — Encounter: Payer: Self-pay | Admitting: Vascular Surgery

## 2014-03-19 ENCOUNTER — Ambulatory Visit (INDEPENDENT_AMBULATORY_CARE_PROVIDER_SITE_OTHER): Payer: Medicare Other | Admitting: Vascular Surgery

## 2014-03-19 VITALS — BP 148/50 | HR 56 | Resp 14 | Ht 60.0 in | Wt 155.0 lb

## 2014-03-19 DIAGNOSIS — I6529 Occlusion and stenosis of unspecified carotid artery: Secondary | ICD-10-CM

## 2014-03-19 DIAGNOSIS — I63239 Cerebral infarction due to unspecified occlusion or stenosis of unspecified carotid arteries: Secondary | ICD-10-CM

## 2014-03-19 NOTE — Progress Notes (Signed)
Subjective:     Patient ID: Megan Mathews, female   DOB: 10/28/1931, 80 y.o.   MRN: [id number]  HPI this 80 year old female returns to discuss her severe left recurrent carotid stenosis and recent left brain CVA by MRI scan. She was in the hospital and was seen by Dr. Tawanna Coolerodd early in [place] of this year. Patient had a left carotid endarterectomy in the past by Dr. Liliane BadeGreg Hayes is a right carotid endarterectomy by me. She now has a 80% recurrent stenosis in the left endarterectomy site. She has renal insufficiency with creatinine 2.7. She is not felt to be a good candidate for stent because of need for contrast and possible injury to kidney. She is a poor operative candidate because of home oxygen and cardiovascular disease followed by Dr. York RamJonathan Barry. Patient denies any specific left brain symptoms at the present time including right-sided weakness or clumsiness, aphasia, amaurosis fugax.  Past Medical History  Diagnosis Date  . COPD (chronic obstructive pulmonary disease)     home 02  . OSA (obstructive sleep apnea)   . Hypoxemia   . Secondary pulmonary hypertension 8/09    "severe" by echo  . CAD (coronary artery disease) '95,'96,'98.'99,'08    Multiple PCI/stenting all 3 vessels  . Diabetes mellitus type II   . Hyperlipidemia   . Hypertension   . Peripheral vascular disease '96, July 2010    AoIliac stenting with PTA for ISR 7/10  . Tobacco abuse     quit '99  . Constipation   . Anxiety   . Depression   . Artery stenosis     renal   bilateral  . Gastritis 04/2011    mild gastritis on EGD  . NSTEMI (non-ST elevated myocardial infarction) 01/02/13    in setting of PSVT, +Trop, no ECG change, no cath  . Carotid artery disease     bilateral carotid endarterectomies with redo on the left    History  Substance Use Topics  . Smoking status: Former Smoker -- 1.00 packs/day for 60 years    Types: Cigarettes    Quit date: 10/08/1997  . Smokeless tobacco: Former NeurosurgeonUser  . Alcohol Use: No     Family History  Problem Relation Age of Onset  . Hypertension Mother   . Hypertension Father     Allergies  Allergen Reactions  . Contrast Media [Iodinated Diagnostic Agents]   . Iohexol Rash and Other (See Comments)    Fever, unable to stand also    Current outpatient prescriptions:acetaminophen (TYLENOL) 500 MG tablet, Take 1,000 mg by mouth every 6 (six) hours as needed for mild pain, fever or headache. , Disp: , Rfl: ;  albuterol (PROAIR HFA) 108 (90 BASE) MCG/ACT inhaler, Inhale 2 puffs into the lungs every 4 (four) hours as needed (Cough, wheeze, or chest congestion). 2 puffs up to 4 times a day as needed, Disp: 1 Inhaler, Rfl: 5 albuterol (PROVENTIL) (2.5 MG/3ML) 0.083% nebulizer solution, Take 2.5 mg by nebulization every 6 (six) hours as needed. For shortness of breath, Disp: , Rfl: ;  amoxicillin (AMOXIL) 500 MG capsule, Take 1 capsule (500 mg total) by mouth 2 (two) times daily., Disp: 20 capsule, Rfl: 0;  aspirin EC 81 MG EC tablet, Take 1 tablet (81 mg total) by mouth daily., Disp: , Rfl:  clopidogrel (PLAVIX) 75 MG tablet, Take 75 mg by mouth daily with breakfast., Disp: , Rfl: ;  diltiazem (CARTIA XT) 180 MG 24 hr capsule, Take 180 mg by mouth daily.,  Disp: , Rfl: ;  FLUoxetine (PROZAC) 20 MG capsule, Take 20 mg by mouth daily., Disp: , Rfl: ;  furosemide (LASIX) 40 MG tablet, Take 40 mg by mouth 2 (two) times daily., Disp: , Rfl: ;  glimepiride (AMARYL) 2 MG tablet, TAKE 1 TABLET BY MOUTH DAILY, Disp: 30 tablet, Rfl: 5 glucose blood test strip, Use to check blood sugar 1 to 2 times a day.  Dx 250.60, Disp: 100 each, Rfl: 5;  guaifenesin (MUCUS RELIEF) 400 MG TABS, Take 400 mg by mouth 2 (two) times daily as needed. For congestion, Disp: , Rfl: ;  isosorbide mononitrate (IMDUR) 30 MG 24 hr tablet, Take 30 mg by mouth daily., Disp: , Rfl: ;  isosorbide mononitrate (IMDUR) 60 MG 24 hr tablet, Take 60 mg by mouth daily., Disp: , Rfl:  metoprolol succinate (TOPROL-XL) 50 MG 24 hr  tablet, Take 50 mg by mouth daily. Take with or immediately following a meal., Disp: , Rfl: ;  Multiple Vitamins-Minerals (MULTIVITAL PO), Take 1 tablet by mouth daily. , Disp: , Rfl: ;  nitroGLYCERIN (NITROLINGUAL) 0.4 MG/SPRAY spray, Place 1 spray under the tongue every 5 (five) minutes x 3 doses as needed for chest pain., Disp: , Rfl:  nystatin (MYCOSTATIN/NYSTOP) 100000 UNIT/GM POWD, Apply 1 g topically 2 (two) times daily as needed. rash, Disp: , Rfl: ;  oxybutynin (DITROPAN-XL) 10 MG 24 hr tablet, Take 1 tablet (10 mg total) by mouth daily., Disp: 90 tablet, Rfl: 3;  oxymetazoline (AFRIN) 0.05 % nasal spray, Place 1 spray into both nostrils 2 (two) times daily as needed (for nose bleeds)., Disp: , Rfl:  pantoprazole (PROTONIX) 40 MG tablet, Take 40 mg by mouth daily., Disp: , Rfl: ;  pentoxifylline (TRENTAL) 400 MG CR tablet, Take 1 by mouth three times day, Disp: , Rfl: ;  potassium chloride (MICRO-K) 10 MEQ CR capsule, Take 20 mEq by mouth 2 (two) times daily., Disp: , Rfl: ;  simvastatin (ZOCOR) 80 MG tablet, Take 80 mg by mouth daily., Disp: , Rfl:  SPIRIVA HANDIHALER 18 MCG inhalation capsule, INHALE CONTENTS OF 1 CAPSULE VIA HANDIHALER DAILY, Disp: 30 capsule, Rfl: 0;  tiotropium (SPIRIVA) 18 MCG inhalation capsule, Place 18 mcg into inhaler and inhale daily., Disp: , Rfl: ;  [DISCONTINUED] oxybutynin (DITROPAN-XL) 10 MG 24 hr tablet, Take 1 tablet (10 mg total) by mouth daily., Disp: 90 tablet, Rfl: 1  BP 148/50  Pulse 56  Resp 14  Ht 5' (1.524 m)  Wt 155 lb (70.308 kg)  BMI 30.27 kg/m2  SpO2 100%  Body mass index is 30.27 kg/(m^2).           Review of Systems patient on home oxygen. He has severe dyspnea on exertion. Denies any active chest pain. Does not ambulate well. Has anemia. Has chronic renal insufficiency with creatinine 2.7. Other systems negative complete review of systems    Objective:   Physical Exam BP 148/50  Pulse 56  Resp 14  Ht 5' (1.524 m)  Wt 155 lb  (70.308 kg)  BMI 30.27 kg/m2  SpO2 100%  Gen.-alert and oriented x3 in no apparent distress-on nasal oxygen HEENT normal for age Lungs no rhonchi or wheezing Cardiovascular regular rhythm no murmurs carotid pulses 3+ palpable -harsh bruit on the left side a softer bruit on the right side Abdomen soft nontender no palpable masses Musculoskeletal free of  major deformities Skin clear -no rashes Neurologic normal Lower extremities 3+ femoral and dorsalis pedis pulses palpable bilaterally with 1+ edema  Today I reviewed the carotid duplex exam performed on April 8 and Dr. Hazle Coca office which reveals 80% recurrent left ICA stenosis. Also reviewed the MRI scan which reveals acute or subacute stroke on left side.       Assessment:     Recurrent severe left ICA stenosis-80% in patient who is very poor operative candidate on home oxygen with history coronary artery disease and renal insufficiency and not felt to be a good candidate for carotid stent because of renal insufficiency    Plan:     Discuss situation with patient and daughter. There is no been answered. She is not an operative candidate the only option would be to follow this operatively or to give small amount of contrast with cerebral angiogram to see if she would be potentially a candidate for carotid stenting although there would be some renal risk. She is not symptomatic at the present time I would like to see her back in 3 months with a carotid duplex scan done in my office at that time. She develops any symptoms in the interim she will be in touch with [place]. I will also discuss this with Dr. York Ram

## 2014-03-21 ENCOUNTER — Ambulatory Visit (INDEPENDENT_AMBULATORY_CARE_PROVIDER_SITE_OTHER): Payer: Medicare Other | Admitting: Family Medicine

## 2014-03-21 ENCOUNTER — Encounter: Payer: Self-pay | Admitting: Family Medicine

## 2014-03-21 VITALS — BP 120/86 | HR 71 | Temp 97.7°F | Ht 60.0 in | Wt 155.5 lb

## 2014-03-21 DIAGNOSIS — N184 Chronic kidney disease, stage 4 (severe): Secondary | ICD-10-CM

## 2014-03-21 DIAGNOSIS — I2510 Atherosclerotic heart disease of native coronary artery without angina pectoris: Secondary | ICD-10-CM

## 2014-03-21 DIAGNOSIS — I10 Essential (primary) hypertension: Secondary | ICD-10-CM

## 2014-03-21 DIAGNOSIS — I739 Peripheral vascular disease, unspecified: Secondary | ICD-10-CM

## 2014-03-21 DIAGNOSIS — I779 Disorder of arteries and arterioles, unspecified: Secondary | ICD-10-CM

## 2014-03-21 DIAGNOSIS — D649 Anemia, unspecified: Secondary | ICD-10-CM

## 2014-03-21 DIAGNOSIS — R627 Adult failure to thrive: Secondary | ICD-10-CM

## 2014-03-21 DIAGNOSIS — I639 Cerebral infarction, unspecified: Secondary | ICD-10-CM | POA: Insufficient documentation

## 2014-03-21 LAB — CBC WITH DIFFERENTIAL/PLATELET
BASOS PCT: 0.5 % (ref 0.0–3.0)
Basophils Absolute: 0 10*3/uL (ref 0.0–0.1)
Eosinophils Absolute: 0.3 10*3/uL (ref 0.0–0.7)
Eosinophils Relative: 3.4 % (ref 0.0–5.0)
HCT: 25 % — ABNORMAL LOW (ref 36.0–46.0)
LYMPHS PCT: 14.5 % (ref 12.0–46.0)
Lymphs Abs: 1.3 10*3/uL (ref 0.7–4.0)
MCHC: 32.6 g/dL (ref 30.0–36.0)
MCV: 88.5 fl (ref 78.0–100.0)
MONOS PCT: 9.3 % (ref 3.0–12.0)
Monocytes Absolute: 0.8 10*3/uL (ref 0.1–1.0)
NEUTROS ABS: 6.4 10*3/uL (ref 1.4–7.7)
Neutrophils Relative %: 72.3 % (ref 43.0–77.0)
Platelets: 309 10*3/uL (ref 150.0–400.0)
RBC: 2.83 Mil/uL — AB (ref 3.87–5.11)
RDW: 14.8 % (ref 11.5–15.5)
WBC: 8.8 10*3/uL (ref 4.0–10.5)

## 2014-03-21 LAB — IBC PANEL
IRON: 38 ug/dL — AB (ref 42–145)
Saturation Ratios: 10.1 % — ABNORMAL LOW (ref 20.0–50.0)
TRANSFERRIN: 269.2 mg/dL (ref 212.0–360.0)

## 2014-03-21 LAB — HM DIABETES FOOT EXAM

## 2014-03-21 LAB — VITAMIN D 25 HYDROXY (VIT D DEFICIENCY, FRACTURES): VITD: 34.03 ng/mL

## 2014-03-21 LAB — FERRITIN: FERRITIN: 17.1 ng/mL (ref 10.0–291.0)

## 2014-03-21 NOTE — Assessment & Plan Note (Signed)
Well controlled. Continue current medication.  

## 2014-03-21 NOTE — Assessment & Plan Note (Signed)
Wt Readings from Last 3 Encounters:  03/21/14 155 lb 8 oz (70.534 kg)  03/19/14 155 lb (70.308 kg)  03/05/14 153 lb 6.4 oz (69.582 kg)   Stale weights.

## 2014-03-21 NOTE — Addendum Note (Signed)
Addended by: Baldomero LamyHAVERS, NATASHA C on: 03/21/2014 02:15 PM   Modules accepted: Orders

## 2014-03-21 NOTE — Assessment & Plan Note (Addendum)
On recheck stable CKD, improved some further from D/C. Off metformin as nephrotoxic.   She will consider nephrologist, but she is not sure she would do dialysis if needed. She will consider.

## 2014-03-21 NOTE — Patient Instructions (Addendum)
Follow blood sugars in AM, goal fasting sugar is 80-120. Call if it is consistently elevated.  Stop at front desk for referral to nephrologist.  Stop at lab on way out.  Follow up in 1 month 30 min OV.

## 2014-03-21 NOTE — Assessment & Plan Note (Signed)
On ASA,Plavix prior to admission, seen by Neuro recommended to continue. Since has known Left ICA disease, [facility] was consulted and they will follow up With Dr. Allyson SabalBerry next week, also has follow up with Dr. Hart RochesterLawson, vascular after that.

## 2014-03-21 NOTE — Assessment & Plan Note (Signed)
chronic issue  -FOBT neg  -likely secondary to CKD  -Transfused 1 unit of PRBC-Hb stable at 8.7 at discharge. Re-eval today... Stable. -Will needErythropoetin injections for anemia of CKD.  Will look into how to start.

## 2014-03-21 NOTE — Progress Notes (Signed)
Pre visit review using our clinic review tool, if applicable. No additional management support is needed unless otherwise documented below in the visit note. 

## 2014-03-21 NOTE — Assessment & Plan Note (Signed)
Medical management per vascular.

## 2014-03-21 NOTE — Assessment & Plan Note (Signed)
Stop metformin.  Follow blood sugars at home, call if greater than 200.

## 2014-03-21 NOTE — Assessment & Plan Note (Signed)
Likely multifactorial cause, chronic disease  Ifob neg in hosp. Will eval iron, and epo. Likely needs epo shots.

## 2014-03-21 NOTE — Assessment & Plan Note (Signed)
Followed by vascular 

## 2014-03-21 NOTE — Assessment & Plan Note (Signed)
Appears began after hosp in 2014. Refer to neph., Will eval epo and vit D.

## 2014-03-21 NOTE — Progress Notes (Signed)
Subjective:    Patient ID: Megan Mathews, female    DOB: 12/18/1931, 80 y.o.   MRN: [id number]  HPI 80 year old female with complicated medical history presents for 3 week follow up of multiple medical issues.  She reports she has been ding well overall. She did a lot of cooking yesterday, but did well.  Carotid stenosis, PVD: Saw vascular ( Dr. Hart RochesterLAwson) in follow up. She is not a surgical candidate for carotid stenosis. She is on plavix and ASA. Recommended  Follow up in 3 months.  CAD: Followed by Dr. Allyson SabalBerry.  Congestion/sinus infeciton: Treated with amox given worsening symptoms and elevated wbc. Has resolved completely.  Diabetes:  Now off metformin, continued  glipizide. Using medications without difficulties:None Hypoglycemic episodes:None Hyperglycemic episodes:None Feet problems:corn on right foot Blood Sugars averaging:  SHe thinks approximatelyFBS 100-120 eye exam within last year:  CKD and related anemia: No regular nephrologist in past, did see one in hospital last year. Prior to 214 Cr was normal. Decrease in cr noted with hop for anemia and MI. Needs epo inj. Anemia: in hosp neg ifob     Review of Systems  Constitutional: Negative for fever and fatigue.  HENT: Negative for ear pain, rhinorrhea and sinus pressure.   Eyes: Negative for pain.  Respiratory: Positive for shortness of breath. Negative for chest tightness.   Cardiovascular: Negative for chest pain, palpitations and leg swelling.  Gastrointestinal: Negative for abdominal pain.  Genitourinary: Negative for dysuria.       Objective:   Physical Exam  Constitutional: Vital signs are normal. She appears well-developed and well-nourished. She is cooperative.  Non-toxic appearance. She does not appear ill. No distress.  Elderly overweight female in NAD  HENT:  Head: Normocephalic.  Right Ear: Hearing, tympanic membrane, external ear and ear canal normal. Tympanic membrane is not erythematous, not  retracted and not bulging.  Left Ear: Hearing, tympanic membrane, external ear and ear canal normal. Tympanic membrane is not erythematous, not retracted and not bulging.  Nose: No mucosal edema or rhinorrhea. Right sinus exhibits no maxillary sinus tenderness and no frontal sinus tenderness. Left sinus exhibits no maxillary sinus tenderness and no frontal sinus tenderness.  Mouth/Throat: Uvula is midline, oropharynx is clear and moist and mucous membranes are normal.  Eyes: Conjunctivae, EOM and lids are normal. Pupils are equal, round, and reactive to light. Lids are everted and swept, no foreign bodies found.  Neck: Trachea normal and normal range of motion. Neck supple. Carotid bruit is not present. No mass and no thyromegaly present.  Cardiovascular: Normal rate, regular rhythm, S1 normal, S2 normal, normal heart sounds, intact distal pulses and normal pulses.  Exam reveals no gallop and no friction rub.   No murmur heard. B harsh bruit, left greater than right  no peripheral edema.  Pulmonary/Chest: Effort normal. Not tachypneic. No respiratory distress. She has no decreased breath sounds. She has no wheezes. She has no rhonchi. She has rales.  Abdominal: Soft. Normal appearance and bowel sounds are normal. There is no tenderness.  Neurological: She is alert.  Skin: Skin is warm, dry and intact. No rash noted.  Psychiatric: Her speech is normal and behavior is normal. Judgment and thought content normal. Her mood appears not anxious. Cognition and memory are normal. She does not exhibit a depressed mood.   Diabetic foot exam: Normal inspection No skin breakdown Pos calluses  Normal DP pulses Normal sensation to light touch and monofilament Nails thickened.  Assessment & Plan:

## 2014-03-22 ENCOUNTER — Telehealth: Payer: Self-pay | Admitting: Family Medicine

## 2014-03-22 MED ORDER — FERROUS SULFATE 325 (65 FE) MG PO TABS: 325.0000 mg | ORAL_TABLET | Freq: Every day | ORAL | Status: DC

## 2014-03-22 NOTE — Addendum Note (Signed)
Addended by: Damita LackLORING, Chalmers Iddings S on: 03/22/2014 03:13 PM   Modules accepted: Orders

## 2014-03-22 NOTE — Telephone Encounter (Signed)
Relevant patient education mailed to patient.  

## 2014-03-25 LAB — ERYTHROPOIETIN: Erythropoietin: 20.8 m[IU]/mL — ABNORMAL HIGH (ref 2.6–18.5)

## 2014-03-30 ENCOUNTER — Other Ambulatory Visit: Payer: Self-pay | Admitting: Cardiovascular Disease

## 2014-04-01 ENCOUNTER — Other Ambulatory Visit: Payer: Self-pay | Admitting: Pulmonary Disease

## 2014-04-01 NOTE — Telephone Encounter (Signed)
Rx was sent to pharmacy electronically. 

## 2014-04-02 ENCOUNTER — Encounter: Payer: Self-pay | Admitting: Pulmonary Disease

## 2014-04-02 ENCOUNTER — Telehealth: Payer: Self-pay | Admitting: Cardiovascular Disease

## 2014-04-02 ENCOUNTER — Ambulatory Visit (INDEPENDENT_AMBULATORY_CARE_PROVIDER_SITE_OTHER): Payer: Medicare Other | Admitting: Pulmonary Disease

## 2014-04-02 VITALS — BP 134/62 | HR 56 | Temp 96.9°F | Ht 60.0 in | Wt 155.0 lb

## 2014-04-02 DIAGNOSIS — R0902 Hypoxemia: Secondary | ICD-10-CM

## 2014-04-02 DIAGNOSIS — G4734 Idiopathic sleep related nonobstructive alveolar hypoventilation: Secondary | ICD-10-CM

## 2014-04-02 DIAGNOSIS — G4733 Obstructive sleep apnea (adult) (pediatric): Secondary | ICD-10-CM

## 2014-04-02 DIAGNOSIS — J4489 Other specified chronic obstructive pulmonary disease: Secondary | ICD-10-CM

## 2014-04-02 DIAGNOSIS — I2510 Atherosclerotic heart disease of native coronary artery without angina pectoris: Secondary | ICD-10-CM

## 2014-04-02 DIAGNOSIS — J449 Chronic obstructive pulmonary disease, unspecified: Secondary | ICD-10-CM

## 2014-04-02 DIAGNOSIS — J9610 Chronic respiratory failure, unspecified whether with hypoxia or hypercapnia: Secondary | ICD-10-CM

## 2014-04-02 DIAGNOSIS — J9611 Chronic respiratory failure with hypoxia: Secondary | ICD-10-CM

## 2014-04-02 MED ORDER — TIOTROPIUM BROMIDE MONOHYDRATE 18 MCG IN CAPS: ORAL_CAPSULE | RESPIRATORY_TRACT | Status: DC

## 2014-04-02 NOTE — Telephone Encounter (Signed)
Rx was sent to pharmacy electronically. 

## 2014-04-02 NOTE — Assessment & Plan Note (Signed)
Will continue with 3 liters oxygen 24/7.  Explained that she will likely always need to use supplemental oxygen.

## 2014-04-02 NOTE — Patient Instructions (Signed)
Will arrange for overnight oxygen test  Follow up in 6 months 

## 2014-04-02 NOTE — Assessment & Plan Note (Signed)
She is intolerant of BiPAP.  Will arrange for ONO with 3 liters to determine whether she needs adjustment to nocturnal oxygen set up.

## 2014-04-02 NOTE — Telephone Encounter (Signed)
Has questions regarding the refill on Megan Mathews's Isosorbide 30 mg

## 2014-04-02 NOTE — Telephone Encounter (Signed)
Spoke with patient daughter and informed her that the 30mg  was filled this morning after the request cam in from pharmacy. She voiced understanding and said she had went by yesterday and ask for it, just wanted to make sure they sent it to us.

## 2014-04-02 NOTE — Assessment & Plan Note (Signed)
Stable on spiriva and prn albuterol. 

## 2014-04-02 NOTE — Progress Notes (Signed)
Chief Complaint  Patient presents with  . Follow-up    Requests refills of Spiriva. Reports trouble with DME regarding oxygen    History of Present Illness: Megan Mathews is a 80 y.o. female former smoker with COPD, OSA intolerant of BiPAP and chronic hypoxic respiratory failure using 3 L O2.  She was in hospital in [place] for a stroke.  She has been followed by Dr. Hart RochesterLawson with [facility].  Her breathing has been okay.  She gets occasional cough and clear sputum.  Her inhalers help.  She also uses mucus relief and this helps.  She uses 3 liters oxygen.  She is no longer using BiPAP >> she had trouble tolerating pressure.  She sleeps okay w/o BiPAP.  Tests: PFT 06/19/08>>FEV1 1.18(68%), FEV1% 63, TLC 3.15(72%), DLCO 43%, +BD  PSG 09/27/07>>AHI 11, O2 nadir 56% 03/19/09  Room air at rest SaO2 87% Spirometry 04/27/11>>FEV1 1.24(74%), FEV1% 76 Spirometry 01/16/13 >> FEV1 1.10 (67%), FEV1% 67 Echo 02/22/14 >> EF 50 to 55%, grade 2 diastolic dysfx, PAS 48 mmHg  PMHx, PSHx, Medications, Allergies, Fhx, Shx reviewed.  Physical Exam:  General - obese, wearing oxygen  HEENT - no sinus tenderness, no oral exudate, wearing dentures, no LAN  Cardiac - s1s2 no murmur  Chest - prolonged exhalation, no wheeze/rales  Abd - soft, nontender  Ext - no edema  Neuro - decreased hearing, normal strength   Assessment/Plan:  Megan HellingVineet Zaniyah Wernette, MD [facility] Pager:  [phone]

## 2014-04-10 ENCOUNTER — Telehealth: Payer: Self-pay | Admitting: Pulmonary Disease

## 2014-04-10 NOTE — Telephone Encounter (Signed)
ONO with 3 liters 04/04/14 >> test time 7 hrs 22 min.  Basal SpO2 94.7%, low SpO2 83%.  Spent 0.5 min with SpO2 < 88%.  Will have my nurse inform pt that ONO looks good on current set up.  No change to her home oxygen set up needed.

## 2014-04-11 NOTE — Telephone Encounter (Signed)
Results have been explained to patient, pt expressed understanding. Nothing further needed.  

## 2014-04-13 ENCOUNTER — Other Ambulatory Visit: Payer: Self-pay | Admitting: Family Medicine

## 2014-04-26 ENCOUNTER — Other Ambulatory Visit: Payer: Self-pay | Admitting: Family Medicine

## 2014-04-26 ENCOUNTER — Ambulatory Visit (INDEPENDENT_AMBULATORY_CARE_PROVIDER_SITE_OTHER): Payer: Medicare Other | Admitting: Family Medicine

## 2014-04-26 VITALS — BP 150/62 | HR 53 | Temp 98.1°F | Ht 60.0 in | Wt 157.5 lb

## 2014-04-26 DIAGNOSIS — E1149 Type 2 diabetes mellitus with other diabetic neurological complication: Secondary | ICD-10-CM

## 2014-04-26 DIAGNOSIS — D6489 Other specified anemias: Secondary | ICD-10-CM

## 2014-04-26 DIAGNOSIS — I10 Essential (primary) hypertension: Secondary | ICD-10-CM

## 2014-04-26 DIAGNOSIS — N184 Chronic kidney disease, stage 4 (severe): Secondary | ICD-10-CM

## 2014-04-26 DIAGNOSIS — I498 Other specified cardiac arrhythmias: Secondary | ICD-10-CM

## 2014-04-26 DIAGNOSIS — I2510 Atherosclerotic heart disease of native coronary artery without angina pectoris: Secondary | ICD-10-CM

## 2014-04-26 DIAGNOSIS — R001 Bradycardia, unspecified: Secondary | ICD-10-CM

## 2014-04-26 MED ORDER — METOPROLOL SUCCINATE ER 25 MG PO TB24: 25.0000 mg | ORAL_TABLET | Freq: Every day | ORAL | Status: DC

## 2014-04-26 NOTE — Assessment & Plan Note (Signed)
Will re-eval today. Pt on iron. EPO was in nl range.. Will have nephrology consider if injections not needed for epo.

## 2014-04-26 NOTE — Progress Notes (Signed)
Pre visit review using our clinic review tool, if applicable. No additional management support is needed unless otherwise documented below in the visit note. 

## 2014-04-26 NOTE — Assessment & Plan Note (Signed)
Pending  Nephrology referral.

## 2014-04-26 NOTE — Assessment & Plan Note (Signed)
Stable control, continue to follow.

## 2014-04-26 NOTE — Patient Instructions (Addendum)
Schedule eye exam when you are able. Stop at lab on way out. Decrease metoprolol to 25 mg daily.  Follow BP and pulse at home. Schedule 30 min follow up in 6 weeks

## 2014-04-26 NOTE — Progress Notes (Signed)
80 year old female with complicated medical history presents for 1 monthfollow up of multiple medical issues.   She reports she has been doing well overall except she has been cold. She has good days and bad days.  Carotid stenosis, PVD: Saw vascular ( Dr. Hart RochesterLAwson) in follow up. She is not a surgical candidate for carotid stenosis. She is on plavix and ASA. Recommended Follow up in 3 months.   CAD: Followed by Dr. Allyson SabalBerry.  She reports that she has been having intermitten tightness in her face, occuring at rest. ? Angina. No chest pain and pressure. BP Readings from Last 3 Encounters:  04/26/14 150/62  04/02/14 134/62  03/21/14 120/86  She was told she had ? Neuralgia ( on left face, ? Trigeminal) in past... This may be causing.  She is on imdur 90 mg daily for angina.  BP elevated today. At home BP usually 140/60s On metoprolol, cartia, lasix and imdur. BP Readings from Last 3 Encounters:  04/26/14 150/62  04/02/14 134/62  03/21/14 120/86   She has had some low pulse at home down to 48, usually in 50s.   Diabetes: Now off metformin, continued glipizide.  Lab Results  Component Value Date   HGBA1C 6.0* 02/20/2014  Using medications without difficulties:None  Hypoglycemic episodes:None  Hyperglycemic episodes:None  Feet problems:corn on right foot  Blood Sugars averaging: She thinks approximatelyFBS 100-140  eye exam within last year: none  CKD and related anemia: No regular nephrologist in past, did see one in hospital last year. Prior to 214 Cr was normal. Decrease in cr noted with hop for anemia and MI.  Needs epo inj.... Referred to nephrology Sept 23  Anemia: in hosp neg ifob  7/16 hg 8.2 She has been taking iron one daily. No constipation.   On 7/28 saw Dr. Craige CottaSood for COPD, on oxygen, sleep apnea: no changes made except schedule for sleep study.  Review of Systems  Constitutional: Negative for fever and fatigue.  HENT: Negative for ear pain, rhinorrhea and sinus pressure.   Eyes: Negative for pain.  Respiratory: Positive for shortness of breath. Negative for chest tightness.  Cardiovascular: Negative for chest pain, palpitations and leg swelling.  Gastrointestinal: Negative for abdominal pain.  Genitourinary: Negative for dysuria.  Objective:   Physical Exam  Constitutional: Vital signs are normal. She appears well-developed and well-nourished. She is cooperative. Non-toxic appearance. She does not appear ill. No distress.  Elderly overweight female in NAD  HENT:  Head: Normocephalic.  Right Ear: Hearing, tympanic membrane, external ear and ear canal normal. Tympanic membrane is not erythematous, not retracted and not bulging.  Left Ear: Hearing, tympanic membrane, external ear and ear canal normal. Tympanic membrane is not erythematous, not retracted and not bulging.  Nose: No mucosal edema or rhinorrhea. Right sinus exhibits no maxillary sinus tenderness and no frontal sinus tenderness. Left sinus exhibits no maxillary sinus tenderness and no frontal sinus tenderness.  Mouth/Throat: Uvula is midline, oropharynx is clear and moist and mucous membranes are normal.  Eyes: Conjunctivae, EOM and lids are normal. Pupils are equal, round, and reactive to light. Lids are everted and swept, no foreign bodies found.  Neck: Trachea normal and normal range of motion. Neck supple. Carotid bruit is not present. No mass and no thyromegaly present.  Cardiovascular: Normal rate, regular rhythm, S1 normal, S2 normal, normal heart sounds, intact distal pulses and normal pulses. Exam reveals no gallop and no friction rub.  No murmur heard. B harsh bruit, left greater than  right no peripheral edema.  Pulmonary/Chest: Effort normal. Not tachypneic. No respiratory distress. She has no decreased breath sounds. She has no wheezes. She has no rhonchi. She has rales.  Abdominal: Soft. Normal appearance and bowel sounds are normal. There is no tenderness.  Neurological: She is alert.   Skin: Skin is warm, dry and intact. No rash noted.  Psychiatric: Her speech is normal and behavior is normal. Judgment and thought content normal. Her mood appears not anxious. Cognition and memory are normal. She does not exhibit a depressed mood.

## 2014-04-26 NOTE — Assessment & Plan Note (Signed)
Likely secondary to multiple medications causing low HR.  Will decrease metoprolol. Family and pt will follow at home.

## 2014-04-26 NOTE — Assessment & Plan Note (Signed)
Follow closely on lower dose of metoprolol.

## 2014-04-27 ENCOUNTER — Telehealth: Payer: Self-pay | Admitting: *Deleted

## 2014-04-27 LAB — CBC WITH DIFFERENTIAL/PLATELET
Basophils Absolute: 0 10*3/uL (ref 0.0–0.1)
Basophils Relative: 0.2 % (ref 0.0–3.0)
Eosinophils Absolute: 0.3 10*3/uL (ref 0.0–0.7)
Eosinophils Relative: 2.8 % (ref 0.0–5.0)
Hemoglobin: 8 g/dL — CL (ref 12.0–15.0)
LYMPHS ABS: 1.3 10*3/uL (ref 0.7–4.0)
LYMPHS PCT: 11.3 % — AB (ref 12.0–46.0)
MCHC: 32.6 g/dL (ref 30.0–36.0)
MCV: 90.4 fl (ref 78.0–100.0)
MONOS PCT: 2.3 % — AB (ref 3.0–12.0)
Monocytes Absolute: 0.3 10*3/uL (ref 0.1–1.0)
Neutro Abs: 9.5 10*3/uL — ABNORMAL HIGH (ref 1.4–7.7)
Neutrophils Relative %: 83.4 % — ABNORMAL HIGH (ref 43.0–77.0)
PLATELETS: 309 10*3/uL (ref 150.0–400.0)
RBC: 2.7 Mil/uL — ABNORMAL LOW (ref 3.87–5.11)
RDW: 16.1 % — AB (ref 11.5–15.5)
WBC: 11.4 10*3/uL — ABNORMAL HIGH (ref 4.0–10.5)

## 2014-04-27 LAB — BASIC METABOLIC PANEL
BUN: 47 mg/dL — ABNORMAL HIGH (ref 6–23)
CO2: 25 meq/L (ref 19–32)
Calcium: 9.7 mg/dL (ref 8.4–10.5)
Chloride: 107 mEq/L (ref 96–112)
Creatinine, Ser: 2.8 mg/dL — ABNORMAL HIGH (ref 0.4–1.2)
GFR: 17.06 mL/min — ABNORMAL LOW (ref >60.00)
Glucose, Bld: 122 mg/dL — ABNORMAL HIGH (ref 70–99)
Potassium: 5 mEq/L (ref 3.5–5.1)
SODIUM: 140 meq/L (ref 135–145)

## 2014-04-27 NOTE — Telephone Encounter (Signed)
Call received from lab stating pt has critical lab-Hgb 8.0

## 2014-05-02 ENCOUNTER — Other Ambulatory Visit: Payer: Self-pay

## 2014-05-02 MED ORDER — FERROUS SULFATE 325 (65 FE) MG PO TABS: 650.0000 mg | ORAL_TABLET | Freq: Every day | ORAL | Status: AC

## 2014-05-02 NOTE — Telephone Encounter (Addendum)
Mrs Megan Mathews pts daughter left v/m requesting refill with updated instructions to [facility] [address].advised Ms Megan Mathews done. Spoke with Megan Mathews at [employer] and ferrous sulfate with instructions 1 daily was cancelled.

## 2014-05-07 ENCOUNTER — Ambulatory Visit: Payer: Medicare Other | Admitting: Internal Medicine

## 2014-06-03 ENCOUNTER — Other Ambulatory Visit: Payer: Self-pay | Admitting: Family Medicine

## 2014-06-07 ENCOUNTER — Encounter: Payer: Self-pay | Admitting: Family Medicine

## 2014-06-07 ENCOUNTER — Ambulatory Visit (INDEPENDENT_AMBULATORY_CARE_PROVIDER_SITE_OTHER): Payer: Medicare Other | Admitting: Family Medicine

## 2014-06-07 VITALS — BP 120/80 | HR 67 | Temp 97.6°F | Ht 60.0 in | Wt 163.5 lb

## 2014-06-07 DIAGNOSIS — D631 Anemia in chronic kidney disease: Secondary | ICD-10-CM

## 2014-06-07 DIAGNOSIS — R001 Bradycardia, unspecified: Secondary | ICD-10-CM

## 2014-06-07 DIAGNOSIS — I10 Essential (primary) hypertension: Secondary | ICD-10-CM

## 2014-06-07 DIAGNOSIS — E1149 Type 2 diabetes mellitus with other diabetic neurological complication: Secondary | ICD-10-CM

## 2014-06-07 DIAGNOSIS — N184 Chronic kidney disease, stage 4 (severe): Secondary | ICD-10-CM

## 2014-06-07 DIAGNOSIS — Z23 Encounter for immunization: Secondary | ICD-10-CM

## 2014-06-07 DIAGNOSIS — I2510 Atherosclerotic heart disease of native coronary artery without angina pectoris: Secondary | ICD-10-CM

## 2014-06-07 NOTE — Assessment & Plan Note (Signed)
Now followed by Dr. Tyrone Schimkeoledenado.  Plan against dialysis if needed given comorbidity.

## 2014-06-07 NOTE — Assessment & Plan Note (Signed)
Resolved on lower dose bblocker

## 2014-06-07 NOTE — Assessment & Plan Note (Addendum)
Maintaining good control on lower dose betablocker.

## 2014-06-07 NOTE — Assessment & Plan Note (Signed)
Well controlled at last check. FBS good. Recheck in 3 months.

## 2014-06-07 NOTE — Progress Notes (Signed)
80 year old female with complicated medical history presents for 2 monthfollow up of multiple medical issues.   She reports she has been doing well overall .  Carotid stenosis, PVD: Saw vascular ( Dr. Hart RochesterLAwson) in follow up. She is not a surgical candidate for carotid stenosis. She is on plavix and ASA. Recommended Follow up in 3 months.   CAD: Followed by Dr. Allyson SabalBerry. She reports that she has been having intermitten tightness in her face, occuring at rest. ? Angina. No chest pain and pressure.  BP Readings from Last 3 Encounters:  06/07/14 120/80  04/26/14 150/62  04/02/14 134/62  She is on imdur 90 mg daily for angina.   Bradycardia:improved today on lower dose of metoprolol.  Diabetes: Now off metformin, continued glipizide.  Lab Results  Component Value Date   HGBA1C 6.0* 02/20/2014  Using medications without difficulties:None  Hypoglycemic episodes:None  Hyperglycemic episodes:None  Feet problems:corn on right foot  Blood Sugars averaging:FBS 86-115 eye exam within last year: none   CKD and related anemia: Saw nephrology Sept 23.  Plan aranesp injections. Anemia: in hosp neg ifob  7/16 hg 8.2 , now on iron 2 tabs daily. No constipation. Nml epo.     Review of Systems  Constitutional: Negative for fever and fatigue.  HENT: Negative for ear pain, rhinorrhea and sinus pressure.  Eyes: Negative for pain.  Respiratory: Positive for shortness of breath. Negative for chest tightness.  Cardiovascular: Negative for chest pain, palpitations and leg swelling.  Gastrointestinal: Negative for abdominal pain.  Genitourinary: Negative for dysuria.  Objective:   Physical Exam  Constitutional: Vital signs are normal. She appears well-developed and well-nourished. She is cooperative. Non-toxic appearance. She does not appear ill. No distress.  Elderly overweight female in NAD  HENT:  Head: Normocephalic.  Right Ear: Hearing, tympanic membrane, external ear and ear canal normal. Tympanic  membrane is not erythematous, not retracted and not bulging.  Left Ear: Hearing, tympanic membrane, external ear and ear canal normal. Tympanic membrane is not erythematous, not retracted and not bulging.  Nose: No mucosal edema or rhinorrhea. Right sinus exhibits no maxillary sinus tenderness and no frontal sinus tenderness. Left sinus exhibits no maxillary sinus tenderness and no frontal sinus tenderness.  Mouth/Throat: Uvula is midline, oropharynx is clear and moist and mucous membranes are normal.  Eyes: Conjunctivae, EOM and lids are normal. Pupils are equal, round, and reactive to light. Lids are everted and swept, no foreign bodies found.  Neck: Trachea normal and normal range of motion. Neck supple. Carotid bruit is not present. No mass and no thyromegaly present.  Cardiovascular: Normal rate, regular rhythm, S1 normal, S2 normal, normal heart sounds, intact distal pulses and normal pulses. Exam reveals no gallop and no friction rub.  No murmur heard. B harsh bruit, left greater than right no peripheral edema.  Pulmonary/Chest: Effort normal. Not tachypneic. No respiratory distress. She has no decreased breath sounds. She has no wheezes. She has no rhonchi. She has rales.  Abdominal: Soft. Normal appearance and bowel sounds are normal. There is no tenderness.  Neurological: She is alert.  Skin: Skin is warm, dry and intact. No rash noted.  Psychiatric: Her speech is normal and behavior is normal. Judgment and thought content normal. Her mood appears not anxious. Cognition and memory are normal. She does not exhibit a depressed mood.

## 2014-06-07 NOTE — Progress Notes (Signed)
Pre visit review using our clinic review tool, if applicable. No additional management support is needed unless otherwise documented below in the visit note. 

## 2014-06-07 NOTE — Patient Instructions (Addendum)
Schedule eye exam when able. Call nephrologist about aranesp if not called to set up in next few weeks. Look into whether flu was given with insurance, come get if it wasn't.

## 2014-06-11 ENCOUNTER — Telehealth: Payer: Self-pay | Admitting: Cardiovascular Disease

## 2014-06-11 MED ORDER — NITROGLYCERIN 0.4 MG/SPRAY TL SOLN: 1.0000 | Status: DC | PRN

## 2014-06-11 NOTE — Telephone Encounter (Signed)
Rx was sent to pharmacy electronically. Clarified with patient that she uses spray NTG and that she is NOT having chest pains.

## 2014-06-11 NOTE — Telephone Encounter (Signed)
Is calling because her mother needs to refill Nitro and the pharmacy stated that they needed a call from Dr. Allyson SabalBerry to refill it . [facility] [address]. Please call  Thanks

## 2014-06-12 ENCOUNTER — Telehealth: Payer: Self-pay | Admitting: *Deleted

## 2014-06-12 MED ORDER — NITROGLYCERIN 0.4 MG SL SUBL: 0.4000 mg | SUBLINGUAL_TABLET | SUBLINGUAL | Status: AC | PRN

## 2014-06-12 NOTE — Telephone Encounter (Signed)
[facility] not being covered by her insurance company.  I called Ms Megan Mathews to see if she had any problem with taking the tablets.  She has never taken the tablets before, but she is willing to try.  I explained that they only last approximately 6 months.  She will need to replace them more frequently the spray.  She verbalized understanding.  I will sent in the RX.

## 2014-06-17 ENCOUNTER — Other Ambulatory Visit (HOSPITAL_COMMUNITY): Payer: Self-pay

## 2014-06-18 ENCOUNTER — Inpatient Hospital Stay (HOSPITAL_COMMUNITY): Admission: RE | Admit: 2014-06-18 | Payer: Medicare Other | Source: Ambulatory Visit

## 2014-06-24 ENCOUNTER — Emergency Department (HOSPITAL_COMMUNITY): Payer: Medicare Other

## 2014-06-24 ENCOUNTER — Encounter (HOSPITAL_COMMUNITY): Payer: Self-pay | Admitting: Emergency Medicine

## 2014-06-24 ENCOUNTER — Encounter: Payer: Self-pay | Admitting: Vascular Surgery

## 2014-06-24 ENCOUNTER — Inpatient Hospital Stay (HOSPITAL_COMMUNITY)
Admission: EM | Admit: 2014-06-24 | Discharge: 2014-06-28 | DRG: 302 | Disposition: A | Discharge status: Home / Self Care | Payer: Medicare Other | Attending: Internal Medicine | Admitting: Internal Medicine

## 2014-06-24 ENCOUNTER — Encounter (HOSPITAL_COMMUNITY): Payer: Medicare Other

## 2014-06-24 DIAGNOSIS — I739 Peripheral vascular disease, unspecified: Secondary | ICD-10-CM | POA: Diagnosis present

## 2014-06-24 DIAGNOSIS — J9621 Acute and chronic respiratory failure with hypoxia: Secondary | ICD-10-CM | POA: Diagnosis present

## 2014-06-24 DIAGNOSIS — N184 Chronic kidney disease, stage 4 (severe): Secondary | ICD-10-CM | POA: Diagnosis present

## 2014-06-24 DIAGNOSIS — Z87891 Personal history of nicotine dependence: Secondary | ICD-10-CM | POA: Diagnosis not present

## 2014-06-24 DIAGNOSIS — F419 Anxiety disorder, unspecified: Secondary | ICD-10-CM | POA: Diagnosis present

## 2014-06-24 DIAGNOSIS — G4733 Obstructive sleep apnea (adult) (pediatric): Secondary | ICD-10-CM | POA: Diagnosis present

## 2014-06-24 DIAGNOSIS — E1149 Type 2 diabetes mellitus with other diabetic neurological complication: Secondary | ICD-10-CM | POA: Diagnosis not present

## 2014-06-24 DIAGNOSIS — I252 Old myocardial infarction: Secondary | ICD-10-CM

## 2014-06-24 DIAGNOSIS — Z7982 Long term (current) use of aspirin: Secondary | ICD-10-CM

## 2014-06-24 DIAGNOSIS — Z7189 Other specified counseling: Secondary | ICD-10-CM

## 2014-06-24 DIAGNOSIS — Z955 Presence of coronary angioplasty implant and graft: Secondary | ICD-10-CM

## 2014-06-24 DIAGNOSIS — J9611 Chronic respiratory failure with hypoxia: Secondary | ICD-10-CM | POA: Diagnosis present

## 2014-06-24 DIAGNOSIS — I272 Other secondary pulmonary hypertension: Secondary | ICD-10-CM | POA: Diagnosis present

## 2014-06-24 DIAGNOSIS — Z79899 Other long term (current) drug therapy: Secondary | ICD-10-CM

## 2014-06-24 DIAGNOSIS — I25110 Atherosclerotic heart disease of native coronary artery with unstable angina pectoris: Principal | ICD-10-CM | POA: Diagnosis present

## 2014-06-24 DIAGNOSIS — I214 Non-ST elevation (NSTEMI) myocardial infarction: Secondary | ICD-10-CM

## 2014-06-24 DIAGNOSIS — J449 Chronic obstructive pulmonary disease, unspecified: Secondary | ICD-10-CM | POA: Diagnosis present

## 2014-06-24 DIAGNOSIS — D631 Anemia in chronic kidney disease: Secondary | ICD-10-CM | POA: Diagnosis present

## 2014-06-24 DIAGNOSIS — F329 Major depressive disorder, single episode, unspecified: Secondary | ICD-10-CM | POA: Diagnosis not present

## 2014-06-24 DIAGNOSIS — Z515 Encounter for palliative care: Secondary | ICD-10-CM

## 2014-06-24 DIAGNOSIS — D649 Anemia, unspecified: Secondary | ICD-10-CM

## 2014-06-24 DIAGNOSIS — R079 Chest pain, unspecified: Secondary | ICD-10-CM

## 2014-06-24 DIAGNOSIS — N185 Chronic kidney disease, stage 5: Secondary | ICD-10-CM

## 2014-06-24 DIAGNOSIS — N189 Chronic kidney disease, unspecified: Secondary | ICD-10-CM

## 2014-06-24 DIAGNOSIS — Z66 Do not resuscitate: Secondary | ICD-10-CM | POA: Diagnosis present

## 2014-06-24 DIAGNOSIS — E785 Hyperlipidemia, unspecified: Secondary | ICD-10-CM | POA: Diagnosis not present

## 2014-06-24 DIAGNOSIS — Z8249 Family history of ischemic heart disease and other diseases of the circulatory system: Secondary | ICD-10-CM

## 2014-06-24 DIAGNOSIS — Z9981 Dependence on supplemental oxygen: Secondary | ICD-10-CM | POA: Diagnosis not present

## 2014-06-24 DIAGNOSIS — I200 Unstable angina: Secondary | ICD-10-CM | POA: Diagnosis present

## 2014-06-24 DIAGNOSIS — I129 Hypertensive chronic kidney disease with stage 1 through stage 4 chronic kidney disease, or unspecified chronic kidney disease: Secondary | ICD-10-CM | POA: Diagnosis present

## 2014-06-24 DIAGNOSIS — N289 Disorder of kidney and ureter, unspecified: Secondary | ICD-10-CM

## 2014-06-24 DIAGNOSIS — I10 Essential (primary) hypertension: Secondary | ICD-10-CM | POA: Diagnosis present

## 2014-06-24 DIAGNOSIS — R627 Adult failure to thrive: Secondary | ICD-10-CM

## 2014-06-24 DIAGNOSIS — I209 Angina pectoris, unspecified: Secondary | ICD-10-CM

## 2014-06-24 LAB — TROPONIN I
TROPONIN I: 0.36 ng/mL — AB (ref <0.30)
TROPONIN I: 0.3 ng/mL — AB (ref <0.30)

## 2014-06-24 LAB — I-STAT TROPONIN, ED: TROPONIN I, POC: 0.02 ng/mL (ref 0.00–0.08)

## 2014-06-24 LAB — CBC
HCT: 23.8 % — ABNORMAL LOW (ref 36.0–46.0)
Hemoglobin: 7.8 g/dL — ABNORMAL LOW (ref 12.0–15.0)
MCH: 29.5 pg (ref 26.0–34.0)
MCHC: 32.8 g/dL (ref 30.0–36.0)
MCV: 90.2 fL (ref 78.0–100.0)
PLATELETS: 281 10*3/uL (ref 150–400)
RBC: 2.64 MIL/uL — AB (ref 3.87–5.11)
RDW: 14.3 % (ref 11.5–15.5)
WBC: 10.7 10*3/uL — AB (ref 4.0–10.5)

## 2014-06-24 LAB — GLUCOSE, CAPILLARY
Glucose-Capillary: 130 mg/dL — ABNORMAL HIGH (ref 70–99)
Glucose-Capillary: 142 mg/dL — ABNORMAL HIGH (ref 70–99)
Glucose-Capillary: 148 mg/dL — ABNORMAL HIGH (ref 70–99)
Glucose-Capillary: 131 mg/dL — ABNORMAL HIGH (ref 70–99)

## 2014-06-24 LAB — BASIC METABOLIC PANEL
Anion gap: 16 — ABNORMAL HIGH (ref 5–15)
BUN: 61 mg/dL — ABNORMAL HIGH (ref 6–23)
CALCIUM: 9.2 mg/dL (ref 8.4–10.5)
CO2: 21 mEq/L (ref 19–32)
Chloride: 103 mEq/L (ref 96–112)
Creatinine, Ser: 2.94 mg/dL — ABNORMAL HIGH (ref 0.50–1.10)
GFR calc Af Amer: 16 mL/min — ABNORMAL LOW (ref >90)
GFR, EST NON AFRICAN AMERICAN: 14 mL/min — AB (ref >90)
Glucose, Bld: 163 mg/dL — ABNORMAL HIGH (ref 70–99)
POTASSIUM: 4 meq/L (ref 3.7–5.3)
Sodium: 140 mEq/L (ref 137–147)

## 2014-06-24 LAB — MRSA PCR SCREENING: MRSA by PCR: NEGATIVE

## 2014-06-24 LAB — HEPARIN LEVEL (UNFRACTIONATED)
HEPARIN UNFRACTIONATED: 0.32 [IU]/mL (ref 0.30–0.70)
Heparin Unfractionated: 0.24 IU/mL — ABNORMAL LOW (ref 0.30–0.70)

## 2014-06-24 MED ORDER — FUROSEMIDE 40 MG PO TABS
40.0000 mg | ORAL_TABLET | Freq: Two times a day (BID) | ORAL | Status: DC
  Administered 2014-06-24 – 2014-06-28 (×7): 40 mg via ORAL
  Filled 2014-06-24 (×11): qty 1

## 2014-06-24 MED ORDER — NITROGLYCERIN IN D5W 200-5 MCG/ML-% IV SOLN
0.0000 ug/min | INTRAVENOUS | Status: DC
  Administered 2014-06-24: 10 ug/min via INTRAVENOUS
  Administered 2014-06-25: 35 ug/min via INTRAVENOUS
  Administered 2014-06-26: 50 ug/min via INTRAVENOUS
  Filled 2014-06-24 (×3): qty 250

## 2014-06-24 MED ORDER — GUAIFENESIN ER 600 MG PO TB12
600.0000 mg | ORAL_TABLET | Freq: Two times a day (BID) | ORAL | Status: DC | PRN
  Administered 2014-06-28: 600 mg via ORAL
  Filled 2014-06-24 (×2): qty 1

## 2014-06-24 MED ORDER — ASPIRIN EC 81 MG PO TBEC
81.0000 mg | DELAYED_RELEASE_TABLET | Freq: Every day | ORAL | Status: DC
  Administered 2014-06-24 – 2014-06-26 (×3): 81 mg via ORAL
  Filled 2014-06-24 (×3): qty 1

## 2014-06-24 MED ORDER — CETYLPYRIDINIUM CHLORIDE 0.05 % MT LIQD
7.0000 mL | Freq: Two times a day (BID) | OROMUCOSAL | Status: DC
  Administered 2014-06-24 – 2014-06-27 (×8): 7 mL via OROMUCOSAL

## 2014-06-24 MED ORDER — ATORVASTATIN CALCIUM 40 MG PO TABS
40.0000 mg | ORAL_TABLET | Freq: Every day | ORAL | Status: DC
  Administered 2014-06-24 – 2014-06-25 (×2): 40 mg via ORAL
  Filled 2014-06-24 (×3): qty 1

## 2014-06-24 MED ORDER — ALBUTEROL SULFATE (2.5 MG/3ML) 0.083% IN NEBU: 2.5000 mg | INHALATION_SOLUTION | Freq: Four times a day (QID) | RESPIRATORY_TRACT | Status: DC | PRN

## 2014-06-24 MED ORDER — OXYBUTYNIN CHLORIDE ER 10 MG PO TB24
10.0000 mg | ORAL_TABLET | Freq: Every day | ORAL | Status: DC
  Administered 2014-06-24 – 2014-06-25 (×2): 10 mg via ORAL
  Filled 2014-06-24 (×3): qty 1

## 2014-06-24 MED ORDER — ISOSORBIDE MONONITRATE ER 60 MG PO TB24: 90.0000 mg | ORAL_TABLET | Freq: Every day | ORAL | Status: DC

## 2014-06-24 MED ORDER — NITROGLYCERIN 0.4 MG SL SUBL
0.4000 mg | SUBLINGUAL_TABLET | SUBLINGUAL | Status: DC | PRN
  Administered 2014-06-24 (×2): 0.4 mg via SUBLINGUAL

## 2014-06-24 MED ORDER — METOPROLOL SUCCINATE ER 25 MG PO TB24
25.0000 mg | ORAL_TABLET | Freq: Every day | ORAL | Status: DC
  Administered 2014-06-24 – 2014-06-25 (×2): 25 mg via ORAL
  Filled 2014-06-24 (×2): qty 1

## 2014-06-24 MED ORDER — NITROGLYCERIN 0.4 MG SL SUBL: 0.4000 mg | SUBLINGUAL_TABLET | SUBLINGUAL | Status: DC | PRN

## 2014-06-24 MED ORDER — DILTIAZEM HCL ER COATED BEADS 180 MG PO CP24
180.0000 mg | ORAL_CAPSULE | Freq: Every day | ORAL | Status: DC
  Administered 2014-06-24 – 2014-06-25 (×2): 180 mg via ORAL
  Filled 2014-06-24 (×2): qty 1

## 2014-06-24 MED ORDER — FLUOXETINE HCL 20 MG PO CAPS
20.0000 mg | ORAL_CAPSULE | Freq: Every day | ORAL | Status: DC
  Administered 2014-06-24 – 2014-06-28 (×5): 20 mg via ORAL
  Filled 2014-06-24 (×5): qty 1

## 2014-06-24 MED ORDER — HYDRALAZINE HCL 10 MG PO TABS
10.0000 mg | ORAL_TABLET | Freq: Once | ORAL | Status: DC
  Filled 2014-06-24: qty 1

## 2014-06-24 MED ORDER — ONDANSETRON HCL 4 MG/2ML IJ SOLN: 4.0000 mg | Freq: Four times a day (QID) | INTRAMUSCULAR | Status: DC | PRN

## 2014-06-24 MED ORDER — NITROGLYCERIN 0.4 MG SL SUBL
SUBLINGUAL_TABLET | SUBLINGUAL | Status: AC
  Administered 2014-06-24: 0.4 mg via SUBLINGUAL
  Filled 2014-06-24: qty 1

## 2014-06-24 MED ORDER — CLOPIDOGREL BISULFATE 75 MG PO TABS
75.0000 mg | ORAL_TABLET | Freq: Every day | ORAL | Status: DC
  Administered 2014-06-24 – 2014-06-26 (×3): 75 mg via ORAL
  Filled 2014-06-24 (×4): qty 1

## 2014-06-24 MED ORDER — ACETAMINOPHEN 325 MG PO TABS
650.0000 mg | ORAL_TABLET | ORAL | Status: DC | PRN
Start: 2014-06-24 — End: 2014-06-28

## 2014-06-24 MED ORDER — METOPROLOL SUCCINATE ER 25 MG PO TB24
25.0000 mg | ORAL_TABLET | Freq: Every day | ORAL | Status: DC
  Filled 2014-06-24: qty 1

## 2014-06-24 MED ORDER — TIOTROPIUM BROMIDE MONOHYDRATE 18 MCG IN CAPS
18.0000 ug | ORAL_CAPSULE | Freq: Every day | RESPIRATORY_TRACT | Status: DC
  Administered 2014-06-24 – 2014-06-28 (×5): 18 ug via RESPIRATORY_TRACT
  Filled 2014-06-24 (×2): qty 5

## 2014-06-24 MED ORDER — HEPARIN (PORCINE) IN NACL 100-0.45 UNIT/ML-% IJ SOLN
1150.0000 [IU]/h | INTRAMUSCULAR | Status: DC
  Administered 2014-06-24: 850 [IU]/h via INTRAVENOUS
  Administered 2014-06-25: 1000 [IU]/h via INTRAVENOUS
  Administered 2014-06-26: 1150 [IU]/h via INTRAVENOUS
  Filled 2014-06-24 (×6): qty 250

## 2014-06-24 MED ORDER — PANTOPRAZOLE SODIUM 40 MG PO TBEC
40.0000 mg | DELAYED_RELEASE_TABLET | Freq: Every day | ORAL | Status: DC
  Administered 2014-06-24 – 2014-06-28 (×5): 40 mg via ORAL
  Filled 2014-06-24 (×5): qty 1

## 2014-06-24 MED ORDER — INFLUENZA VAC SPLIT QUAD 0.5 ML IM SUSY
0.5000 mL | PREFILLED_SYRINGE | INTRAMUSCULAR | Status: AC
Start: 2014-06-25 — End: 2014-06-25
  Administered 2014-06-25: 0.5 mL via INTRAMUSCULAR
  Filled 2014-06-24: qty 0.5

## 2014-06-24 MED ORDER — PENTOXIFYLLINE ER 400 MG PO TBCR
400.0000 mg | EXTENDED_RELEASE_TABLET | Freq: Three times a day (TID) | ORAL | Status: DC
  Administered 2014-06-24 – 2014-06-28 (×14): 400 mg via ORAL
  Filled 2014-06-24 (×16): qty 1

## 2014-06-24 MED ORDER — HEPARIN BOLUS VIA INFUSION
3000.0000 [IU] | Freq: Once | INTRAVENOUS | Status: AC
  Administered 2014-06-24: 3000 [IU] via INTRAVENOUS
  Filled 2014-06-24: qty 3000

## 2014-06-24 NOTE — H&P (Addendum)
Admission History and Physical     Patient ID: Megan Mathews, MRN: [id number], DOB: 12/28/1931 80 y.o. Date of Encounter: 06/24/2014, 4:19 AM  Primary Physician: Kerby NoraAmy Bedsole, MD Primary Cardiologist: Dr. Allyson SabalBerry  Chief Complaint:  Chest pain  History of Present Illness: Megan Mathews is a 80 y.o. female with significant vascular disease both peripheral and coronary s/p multiple PCIs, stage IV CKD, who presents with chest pain.  For the past 36 hours she has had a pressure/burning pain in her chest that radiates to her jaw.  No associated dyspnea, nausea, diaphoresis.  Present at rest and exertion and not relieved by nitroglycerin.  She took aspirin after calling 911 and her pain improved.    In ED concern was for unstable angina and heparin gtt was started.  She had an acute episode of angina later in her ED course with diffuse ST depression on EKG with elevation in aVR.  Treating with SL NTG.      Past Medical History  Diagnosis Date  . COPD (chronic obstructive pulmonary disease)     home 02  . OSA (obstructive sleep apnea)   . Hypoxemia   . Secondary pulmonary hypertension 8/09    "severe" by echo  . CAD (coronary artery disease) '95,'96,'98.'99,'08    Multiple PCI/stenting all 3 vessels  . Diabetes mellitus type II   . Hyperlipidemia   . Hypertension   . Peripheral vascular disease '96, July 2010    AoIliac stenting with PTA for ISR 7/10  . Tobacco abuse     quit '99  . Constipation   . Anxiety   . Depression   . Artery stenosis     renal   bilateral  . Gastritis 04/2011    mild gastritis on EGD  . NSTEMI (non-ST elevated myocardial infarction) 01/02/13    in setting of PSVT, +Trop, no ECG change, no cath  . Carotid artery disease     bilateral carotid endarterectomies with redo on the left     Past Surgical History  Procedure Laterality Date  . Carotid endarterectomy      bilat with redo on Lt  . Aaa stent  10/96  . Bilateral iliac stenting  10/96, 7/10    . Resection of vocal chord lesions    . Excision serous cyst adenofibroma    . Coronary angioplasty  2/95    RCA  . Coronary angioplasty with stent placement  8/96    RCA  . Coronary angioplasty with stent placement  5/98    RCA X 2  . Coronary angioplasty with stent placement  11/99    CFX,LAD,Dx  . Coronary angiogram  12/08    patent stents, 40% LM  . Thoracotomy Left 1960s    removal of "glandular tumor" (pt's words)  . Total abdominal hysterectomy        Current Facility-Administered Medications  Medication Dose Route Frequency Provider Last Rate Last Dose  . heparin ADULT infusion 100 units/mL (25000 units/250 mL)  850 Units/hr Intravenous Continuous Dione Boozeavid Glick, MD      . heparin bolus via infusion 3,000 Units  3,000 Units Intravenous Once Dione Boozeavid Glick, MD       Current Outpatient Prescriptions  Medication Sig Dispense Refill  . acetaminophen (TYLENOL) 500 MG tablet Take 1,000 mg by mouth every 6 (six) hours as needed for mild pain, fever or headache.       . albuterol (PROAIR HFA) 108 (90 BASE) MCG/ACT inhaler Inhale 2 puffs into the  lungs every 4 (four) hours as needed (Cough, wheeze, or chest congestion). 2 puffs up to 4 times a day as needed  1 Inhaler  5  . albuterol (PROVENTIL) (2.5 MG/3ML) 0.083% nebulizer solution Take 2.5 mg by nebulization every 6 (six) hours as needed. For shortness of breath      . aspirin EC 81 MG EC tablet Take 1 tablet (81 mg total) by mouth daily.      . clopidogrel (PLAVIX) 75 MG tablet Take 75 mg by mouth daily with breakfast.      . diltiazem (CARTIA XT) 180 MG 24 hr capsule Take 180 mg by mouth daily.      . ferrous sulfate 325 (65 FE) MG tablet Take 2 tablets (650 mg total) by mouth daily.  60 tablet  5  . FLUoxetine (PROZAC) 20 MG capsule Take 20 mg by mouth daily.      . furosemide (LASIX) 40 MG tablet Take 40 mg by mouth 2 (two) times daily.      Marland Kitchen glimepiride (AMARYL) 2 MG tablet Take 2 mg by mouth daily with breakfast.      .  glucose blood test strip Use to check blood sugar 1 to 2 times a day.  Dx 250.60  100 each  5  . guaifenesin (MUCUS RELIEF) 400 MG TABS Take 400 mg by mouth 2 (two) times daily as needed. For congestion      . isosorbide mononitrate (IMDUR) 30 MG 24 hr tablet Take 30 mg by mouth daily. Takes along with the 60mg  tablet to equal 90mg       . isosorbide mononitrate (IMDUR) 60 MG 24 hr tablet Take 60 mg by mouth daily. Takes along with the 30mg  tablet to equal 90mg       . metoprolol succinate (TOPROL-XL) 25 MG 24 hr tablet Take 25 mg by mouth daily.      . Multiple Vitamins-Minerals (MULTIVITAL PO) Take 1 tablet by mouth daily.       . nitroGLYCERIN (NITROSTAT) 0.4 MG SL tablet Place 1 tablet (0.4 mg total) under the tongue every 5 (five) minutes as needed for chest pain.  90 tablet  3  . oxybutynin (DITROPAN-XL) 10 MG 24 hr tablet Take 10 mg by mouth at bedtime.      Marland Kitchen oxymetazoline (AFRIN) 0.05 % nasal spray Place 1 spray into both nostrils 2 (two) times daily as needed (for nose bleeds).      . pantoprazole (PROTONIX) 40 MG tablet Take 40 mg by mouth daily.      . pentoxifylline (TRENTAL) 400 MG CR tablet Take 400 mg by mouth 3 (three) times daily with meals.       . potassium chloride (MICRO-K) 10 MEQ CR capsule Take 20 mEq by mouth daily.       . simvastatin (ZOCOR) 80 MG tablet Take 80 mg by mouth daily.      Marland Kitchen tiotropium (SPIRIVA) 18 MCG inhalation capsule Place 18 mcg into inhaler and inhale daily.      . [DISCONTINUED] oxybutynin (DITROPAN-XL) 10 MG 24 hr tablet Take 1 tablet (10 mg total) by mouth daily.  90 tablet  1      Allergies: Allergies  Allergen Reactions  . Contrast Media [Iodinated Diagnostic Agents] Other (See Comments)    unknown  . Iohexol Rash and Other (See Comments)    Fever, unable to stand also     Social History:  The patient  reports that she quit smoking about 16 years ago. Her  smoking use included Cigarettes. She has a 60 pack-year smoking history. She has quit  using smokeless tobacco. She reports that she does not drink alcohol or use illicit drugs.   Family History:  The patient's family history includes Hypertension in her father and mother.   ROS:  Please see the history of present illness.  All other systems reviewed and negative.   Vital Signs: Blood pressure 157/71, temperature 99 F (37.2 C), temperature source Oral, resp. rate 18, height 4' 11.84" (1.52 m), weight 74.163 kg (163 lb 8 oz), SpO2 98.00%.  PHYSICAL EXAM: General:  Having active angina, moderate distress HEENT: normal Lymph: no adenopathy Neck: no JVD Endocrine:  No thryomegaly Cardiac:  normal S1, S2; RRR; no murmur Lungs:  clear to auscultation bilaterally Abd: soft, nontender, no hepatomegaly Ext: no edema Musculoskeletal:  No deformities, BUE and BLE strength normal and equal Skin: warm and dry Neuro:  CNs 2-12 intact, no focal abnormalities noted Psych:  Normal affect   EKG:   Significant ST depressions diffusely with chest pain.  Labs:   Lab Results  Component Value Date   WBC 10.7* 06/24/2014   HGB 7.8* 06/24/2014   HCT 23.8* 06/24/2014   MCV 90.2 06/24/2014   PLT 281 06/24/2014    Recent Labs Lab 06/24/14 0311  NA 140  K 4.0  CL 103  CO2 21  BUN 61*  CREATININE 2.94*  CALCIUM 9.2  GLUCOSE 163*   No results found for this basename: CKTOTAL, CKMB, TROPONINI,  in the last 72 hours Lab Results  Component Value Date   CHOL 166 02/22/2014   HDL 51 02/22/2014   LDLCALC 89 02/22/2014   TRIG 129 02/22/2014   No results found for this basename: DDIMER    Radiology/Studies:  Dg Chest 2 View  06/24/2014   CLINICAL DATA:  Central chest pain for a few days radiating to the jaw.  EXAM: CHEST  2 VIEW  COMPARISON:  01/16/2013  FINDINGS: Borderline heart size with mild pulmonary vascular congestion. Nodule or focal infiltration suggested in the left superior hilar region. Consider chest CT for further evaluation of this finding. This measures about 2.6  cm in diameter and probably localizes posteriorly. Mild emphysematous changes in the lungs. Fluid or thickened pleura in the left costophrenic angle. No pneumothorax. Calcification of the aorta. Surgical clips in the base of the neck.  IMPRESSION: Borderline heart size with mild pulmonary vascular congestion. Developing nodule versus focal infiltration in the left suprahilar region. Consider CT for further evaluation.   Electronically Signed   By: Burman NievesWilliam  Stevens M.D.   On: 06/24/2014 03:31     ASSESSMENT AND PLAN:   80 yo F with significant vascular disease and known unprotected left main stenosis, along with significant CKD.  Presenting with unstable angina.  EKG with chest pain is concerning for LM disease.  Difficult situation given renal dysfunction.  Plan: - continue heparin gtt, aspirin, plavix - blood pressure control with home meds.  Add PO hydralazine now.  Nitroglycerin gtt ordered, goal SBP less than 120. - change simvastatin 80mg  to atorva 40mg . - NPO for consideration of cath if pain cannot be controlled medically.   - Chronic anemia from CKD noted.  Signed,  Yaakov GuthrieJoseph Hal Norrington, MD 06/24/2014, 4:19 AM

## 2014-06-24 NOTE — Progress Notes (Signed)
ANTICOAGULATION CONSULT NOTE - Follow-up Consult  Pharmacy Consult for Heparin Indication: chest pain/ACS  Allergies  Allergen Reactions  . Contrast Media [Iodinated Diagnostic Agents] Other (See Comments)    unknown  . Iohexol Rash and Other (See Comments)    Fever, unable to stand also    Patient Measurements: Height: 5' (152.4 cm) Weight: 168 lb 10.4 oz (76.5 kg) IBW/kg (Calculated) : 45.5 Heparin Dosing Weight: 60 kg  Vital Signs: Temp: 98.3 F (36.8 C) (10/19 1215) Temp Source: Oral (10/19 1215) BP: 131/45 mmHg (10/19 1130) Pulse Rate: 78 (10/19 1130)  Labs:  Recent Labs  06/24/14 0311 06/24/14 0508 06/24/14 1145  HGB 7.8*  --   --   HCT 23.8*  --   --   PLT 281  --   --   HEPARINUNFRC  --   --  0.24*  CREATININE 2.94*  --   --   TROPONINI  --  <0.30 0.36*    Estimated Creatinine Clearance: 13.9 ml/min (by C-G formula based on Cr of 2.94).  Assessment: 80 yo female on heparin for chest pain.  Heparin level 0.24 (subtherapeutic) on 850 units/hr. No bleeding noted. Hgb low but baseline ~8 over past few months. Plt ok. No bleeding noted.  Goal of Therapy:  Heparin level 0.3-0.7 units/ml Monitor platelets by anticoagulation protocol: Yes   Plan:  Increase heparin gtt to 1000 units/hr Check heparin level in 8 hours. Daily heparin level and CBC  Christoper Fabianaron Kaloni Bisaillon, PharmD, BCPS Clinical pharmacist, pager [phone] 06/24/2014,1:04 PM

## 2014-06-24 NOTE — ED Provider Notes (Signed)
CSN: [id number]     Arrival date & time 06/24/14  0257 History   First MD Initiated Contact with Patient 06/24/14 [phone]     Chief Complaint  Patient presents with  . Chest Pain     (Consider location/radiation/quality/duration/timing/severity/associated sxs/prior Treatment) Patient is a 80 y.o. female presenting with chest pain. The history is provided by the patient.  Chest Pain She has been having intermittent chest pain for approximately last 36 hours. She does describes it as a hard pain in her chest which radiates to her jaw. There is no associated dyspnea, nausea, diaphoresis. She has not noted any exertional component. It is not affected by deep breathing or body position. She does have history of coronary artery disease but cannot state if this pain is like her previous heart pains. He had been taking nitroglycerin at home with no relief. She called 911 who suggested that she take aspirin and she has been pain-free since taking the aspirin. She does have history of cardiac stents.  Past Medical History  Diagnosis Date  . COPD (chronic obstructive pulmonary disease)     home 02  . OSA (obstructive sleep apnea)   . Hypoxemia   . Secondary pulmonary hypertension 8/09    "severe" by echo  . CAD (coronary artery disease) '95,'96,'98.'99,'08    Multiple PCI/stenting all 3 vessels  . Diabetes mellitus type II   . Hyperlipidemia   . Hypertension   . Peripheral vascular disease '96, July 2010    AoIliac stenting with PTA for ISR 7/10  . Tobacco abuse     quit '99  . Constipation   . Anxiety   . Depression   . Artery stenosis     renal   bilateral  . Gastritis 04/2011    mild gastritis on EGD  . NSTEMI (non-ST elevated myocardial infarction) 01/02/13    in setting of PSVT, +Trop, no ECG change, no cath  . Carotid artery disease     bilateral carotid endarterectomies with redo on the left   Past Surgical History  Procedure Laterality Date  . Carotid endarterectomy      bilat  with redo on Lt  . Aaa stent  10/96  . Bilateral iliac stenting  10/96, 7/10  . Resection of vocal chord lesions    . Excision serous cyst adenofibroma    . Coronary angioplasty  2/95    RCA  . Coronary angioplasty with stent placement  8/96    RCA  . Coronary angioplasty with stent placement  5/98    RCA X 2  . Coronary angioplasty with stent placement  11/99    CFX,LAD,Dx  . Coronary angiogram  12/08    patent stents, 40% LM  . Thoracotomy Left 1960s    removal of "glandular tumor" (pt's words)  . Total abdominal hysterectomy     Family History  Problem Relation Age of Onset  . Hypertension Mother   . Hypertension Father    History  Substance Use Topics  . Smoking status: Former Smoker -- 1.00 packs/day for 60 years    Types: Cigarettes    Quit date: 10/08/1997  . Smokeless tobacco: Former Neurosurgeon  . Alcohol Use: No   OB History   Grav Para Term Preterm Abortions TAB SAB Ect Mult Living                 Review of Systems  Cardiovascular: Positive for chest pain.  All other systems reviewed and are negative.  Allergies  Contrast media and Iohexol  Home Medications   Prior to Admission medications   Medication Sig Start Date End Date Taking? Authorizing Provider  acetaminophen (TYLENOL) 500 MG tablet Take 1,000 mg by mouth every 6 (six) hours as needed for mild pain, fever or headache.     Historical Provider, MD  albuterol (PROAIR HFA) 108 (90 BASE) MCG/ACT inhaler Inhale 2 puffs into the lungs every 4 (four) hours as needed (Cough, wheeze, or chest congestion). 2 puffs up to 4 times a day as needed 11/30/11   Coralyn Helling, MD  albuterol (PROVENTIL) (2.5 MG/3ML) 0.083% nebulizer solution Take 2.5 mg by nebulization every 6 (six) hours as needed. For shortness of breath    Historical Provider, MD  aspirin EC 81 MG EC tablet Take 1 tablet (81 mg total) by mouth daily. 01/11/13   Brittainy Sherlynn Carbon, PA-C  clopidogrel (PLAVIX) 75 MG tablet Take 75 mg by mouth daily  with breakfast.    Historical Provider, MD  diltiazem (CARTIA XT) 180 MG 24 hr capsule Take 180 mg by mouth daily.    Historical Provider, MD  ferrous sulfate 325 (65 FE) MG tablet Take 2 tablets (650 mg total) by mouth daily. 05/02/14   Amy Michelle Nasuti, MD  FLUoxetine (PROZAC) 20 MG capsule Take 20 mg by mouth daily. 01/08/14   Corwin Levins, MD  furosemide (LASIX) 40 MG tablet Take 40 mg by mouth 2 (two) times daily.    Historical Provider, MD  glimepiride (AMARYL) 2 MG tablet TAKE 1 TABLET BY MOUTH DAILY 06/04/14   Amy E Ermalene Searing, MD  glucose blood test strip Use to check blood sugar 1 to 2 times a day.  Dx 250.60 02/26/14   Excell Seltzer, MD  guaifenesin (MUCUS RELIEF) 400 MG TABS Take 400 mg by mouth 2 (two) times daily as needed. For congestion    Historical Provider, MD  isosorbide mononitrate (IMDUR) 30 MG 24 hr tablet TAKE 1 TABLET BY MOUTH ONCE DAILY    Runell Gess, MD  isosorbide mononitrate (IMDUR) 60 MG 24 hr tablet Take 60 mg by mouth daily.    Historical Provider, MD  metoprolol succinate (TOPROL-XL) 25 MG 24 hr tablet TAKE 1 TABLET BY MOUTH DAILY 04/26/14   Amy Michelle Nasuti, MD  Multiple Vitamins-Minerals (MULTIVITAL PO) Take 1 tablet by mouth daily.     Historical Provider, MD  nitroGLYCERIN (NITROSTAT) 0.4 MG SL tablet Place 1 tablet (0.4 mg total) under the tongue every 5 (five) minutes as needed for chest pain. 06/12/14   Runell Gess, MD  nystatin (MYCOSTATIN/NYSTOP) 100000 UNIT/GM POWD Apply 1 g topically 2 (two) times daily as needed. rash    Historical Provider, MD  oxybutynin (DITROPAN-XL) 10 MG 24 hr tablet TAKE 1 TABLET BY MOUTH EVERY DAY 04/13/14   Amy Michelle Nasuti, MD  oxymetazoline (AFRIN) 0.05 % nasal spray Place 1 spray into both nostrils 2 (two) times daily as needed (for nose bleeds). 04/24/12   Lollie Sails, MD  pantoprazole (PROTONIX) 40 MG tablet Take 40 mg by mouth daily.    Historical Provider, MD  pentoxifylline (TRENTAL) 400 MG CR tablet Take 1 by mouth three times  day 11/01/12   Historical Provider, MD  potassium chloride (MICRO-K) 10 MEQ CR capsule Take 20 mEq by mouth 2 (two) times daily.    Historical Provider, MD  simvastatin (ZOCOR) 80 MG tablet Take 80 mg by mouth daily.    Historical Provider, MD  [facility]  HANDIHALER) 18 MCG inhalation capsule INHALE CONTENTS OF 1 CAPSULE VIA HANDIHALER DAILY 04/02/14   Coralyn HellingVineet Sood, MD   BP 157/71  Temp(Src) 99 F (37.2 C) (Oral)  Resp 18  SpO2 98% Physical Exam  Nursing note and vitals reviewed.  80 year old female, resting comfortably and in no acute distress. Vital signs are significant for hypertension. Oxygen saturation is 98%, which is normal. Head is normocephalic and atraumatic. PERRLA, EOMI. Oropharynx is clear. Neck is nontender and supple without adenopathy or JVD. Bilateral carotid endarterectomy scars are present. Back is nontender and there is no CVA tenderness. Lungs are clear without rales, wheezes, or rhonchi. Chest is nontender. Heart has regular rate and rhythm without murmur. Abdomen is soft, flat, nontender without masses or hepatosplenomegaly and peristalsis is normoactive. Extremities have no cyanosis or edema, full range of motion is present. Skin is warm and dry without rash. Neurologic: Mental status is normal, cranial nerves are intact, there are no motor or sensory deficits.  ED Course  Procedures (including critical care time) Labs Review Results for orders placed during the hospital encounter of 06/24/14  CBC      Result Value Ref Range   WBC 10.7 (*) 4.0 - 10.5 K/uL   RBC 2.64 (*) 3.87 - 5.11 MIL/uL   Hemoglobin 7.8 (*) 12.0 - 15.0 g/dL   HCT 96.023.8 (*) 45.436.0 - 09.846.0 %   MCV 90.2  78.0 - 100.0 fL   MCH 29.5  26.0 - 34.0 pg   MCHC 32.8  30.0 - 36.0 g/dL   RDW 11.914.3  14.711.5 - 82.915.5 %   Platelets 281  150 - 400 K/uL  BASIC METABOLIC PANEL      Result Value Ref Range   Sodium 140  137 - 147 mEq/L   Potassium 4.0  3.7 - 5.3 mEq/L   Chloride 103  96 - 112 mEq/L    CO2 21  19 - 32 mEq/L   Glucose, Bld 163 (*) 70 - 99 mg/dL   BUN 61 (*) 6 - 23 mg/dL   Creatinine, Ser 5.622.94 (*) 0.50 - 1.10 mg/dL   Calcium 9.2  8.4 - 13.010.5 mg/dL   GFR calc non Af Amer 14 (*) >90 mL/min   GFR calc Af Amer 16 (*) >90 mL/min   Anion gap 16 (*) 5 - 15  I-STAT TROPOININ, ED      Result Value Ref Range   Troponin i, poc 0.02  0.00 - 0.08 ng/mL   Comment 3           Imaging Review Dg Chest 2 View  06/24/2014   CLINICAL DATA:  Central chest pain for a few days radiating to the jaw.  EXAM: CHEST  2 VIEW  COMPARISON:  01/16/2013  FINDINGS: Borderline heart size with mild pulmonary vascular congestion. Nodule or focal infiltration suggested in the left superior hilar region. Consider chest CT for further evaluation of this finding. This measures about 2.6 cm in diameter and probably localizes posteriorly. Mild emphysematous changes in the lungs. Fluid or thickened pleura in the left costophrenic angle. No pneumothorax. Calcification of the aorta. Surgical clips in the base of the neck.  IMPRESSION: Borderline heart size with mild pulmonary vascular congestion. Developing nodule versus focal infiltration in the left suprahilar region. Consider CT for further evaluation.   Electronically Signed   By: Burman NievesWilliam  Stevens M.D.   On: 06/24/2014 03:31     EKG Interpretation   Date/Time:  Monday June 24 2014 03:04:58 EDT  Ventricular Rate:  79 PR Interval:  197 QRS Duration: 93 QT Interval:  402 QTC Calculation: 461 R Axis:   36 Text Interpretation:  Sinus rhythm Abnormal R-wave progression, early  transition Borderline repolarization abnormality When compared with ECG of  02/20/2014, No significant change was found Confirmed by [facility]  MD, Cheris Tweten  ([id number]) on 06/24/2014 3:13:33 AM      CRITICAL CARE Performed by: UEAVW,UJWJXGLICK,Anan Dapolito Total critical care time: 35 minutes Critical care time was exclusive of separately billable procedures and treating other patients. Critical care was  necessary to treat or prevent imminent or life-threatening deterioration. Critical care was time spent personally by me on the following activities: development of treatment plan with patient and/or surrogate as well as nursing, discussions with consultants, evaluation of patient's response to treatment, examination of patient, obtaining history from patient or surrogate, ordering and performing treatments and interventions, ordering and review of laboratory studies, ordering and review of radiographic studies, pulse oximetry and re-evaluation of patient's condition.  MDM   Final diagnoses:  Unstable angina  Normochromic normocytic anemia  Renal insufficiency    Chest pain worrisome for unstable angina. She has no acute ECG changes and initial troponin is negative. Review of old records shows she has multiple vascular issues including coronary artery stents, renal artery stents, stents of the abdominal aorta and 2 status post carotid endarterectomy. Anemia is noted that is unchanged from baseline. She is started on heparin because of possible unstable angina and this will need to be admitted. Laboratory workup also shows anemia which is unchanged from baseline, and renal insufficiency which is unchanged from baseline. Case is discussed with Dr. Duke Salviaandolph of cardiology service who agrees to admit the patient.    Dione Boozeavid Donovin Kraemer, MD 06/24/14 [phone]

## 2014-06-24 NOTE — Progress Notes (Signed)
I was asked to talk with Ms Megan Mathews who is a long term patient of mine. She has known CAD with stenting of all 3 coronary arteries as well as moderate LM disease and moderately severe renal insuffiencey. She was admitted with accelerated angina and has ruled out for MI but does have ST segment changes. She is on IV hep as well as escalating doses of IV NTG. After a long discussion she has decided not to pursue an aggressive/ interventional approach which I can not disagree with. She prefers a conservative medical approach with pain management. I think then that it is reasonable to obtain a palliative care consult. D/C the IV hep/NTG and transfer to a non tele floor tomorrow.  Runell GessJonathan J. Mikael Skoda, M.D., FACP, [facility] CenterFACC, Earl LagosFAHA, [facility] CenterFSCAI [facility] [address] [place], [place]  [id number]  [phone] 06/24/2014 7:28 PM

## 2014-06-24 NOTE — Care Management Note (Addendum)
    Page 1 of 1   06/27/2014     9:01:28 AM CARE MANAGEMENT NOTE 06/27/2014  [facility]   Account Number:  [id number]  Date Initiated:  06/24/2014  Documentation initiated by:  Junius CreamerWELL,DEBBIE  Subjective/Objective Assessment:   adm w ch pain/angina     Action/Plan:   lives w fam, pcp dr a Ermalene Searingbedsole   Anticipated DC Date:     Anticipated DC Plan:  HOME W HOSPICE CARE      DC Planning Services  CM consult      PAC Choice  HOSPICE   Choice offered to / List presented to:  C-4 Adult Children        HH arranged  HH-1 RN  HH-6 SOCIAL WORKER      HH agency  [facility]   Status of service:   Medicare Important Message given?  YES (If response is "NO", the following Medicare IM given date fields will be blank) Date Medicare IM given:  06/27/2014 Medicare IM given by:  Junius CreamerWELL,DEBBIE Date Additional Medicare IM given:   Additional Medicare IM given by:    Discharge Disposition:  HOME W HOSPICE CARE  Per UR Regulation:  Reviewed for med. necessity/level of care/duration of stay  If discussed at Long Length of Stay Meetings, dates discussed:    Comments:  10/21 1551 debbie Kamarius Buckbee rn,bsn recieved order for home hospice for pt. spoke w pt and da. gave them home health hosp agency list. they [facility].  [facility] and faxed inform to [facility] at [phone] and spoke w office at [phone]. poss dc 1-2 days. has home o2 w adv homecare.

## 2014-06-24 NOTE — ED Notes (Signed)
Pt transported to [facility]. Upon arrival, this RN informed patient will be upgraded to a stepdown bed. Pts CP also returned. Pt returned down to ED and this rn's charge RN, Martie LeeSabrina, informed this RN to take patient to [facility] and to deliver a bedside report. Pt then transported to [facility] and bedside report given to HiramMelissa, Charity fundraiserN.

## 2014-06-24 NOTE — Progress Notes (Signed)
ANTICOAGULATION CONSULT NOTE - Initial Consult  Pharmacy Consult for Heparin Indication: chest pain/ACS  Allergies  Allergen Reactions  . Contrast Media [Iodinated Diagnostic Agents] Other (See Comments)    unknown  . Iohexol Rash and Other (See Comments)    Fever, unable to stand also    Patient Measurements: Height: 4' 11.84" (152 cm) Weight: 163 lb 8 oz (74.163 kg) IBW/kg (Calculated) : 45.14 Heparin Dosing Weight: 60 kg  Vital Signs: Temp: 99 F (37.2 C) (10/19 0300) Temp Source: Oral (10/19 0300) BP: 157/71 mmHg (10/19 0300)  Labs:  Recent Labs  06/24/14 0311  HGB 7.8*  HCT 23.8*  PLT 281  CREATININE 2.94*    Estimated Creatinine Clearance: 13.7 ml/min (by C-G formula based on Cr of 2.94).   Medical History: Past Medical History  Diagnosis Date  . COPD (chronic obstructive pulmonary disease)     home 02  . OSA (obstructive sleep apnea)   . Hypoxemia   . Secondary pulmonary hypertension 8/09    "severe" by echo  . CAD (coronary artery disease) '95,'96,'98.'99,'08    Multiple PCI/stenting all 3 vessels  . Diabetes mellitus type II   . Hyperlipidemia   . Hypertension   . Peripheral vascular disease '96, July 2010    AoIliac stenting with PTA for ISR 7/10  . Tobacco abuse     quit '99  . Constipation   . Anxiety   . Depression   . Artery stenosis     renal   bilateral  . Gastritis 04/2011    mild gastritis on EGD  . NSTEMI (non-ST elevated myocardial infarction) 01/02/13    in setting of PSVT, +Trop, no ECG change, no cath  . Carotid artery disease     bilateral carotid endarterectomies with redo on the left    Medications:  APAP  Albuterol  ASA  Plavix  Cardizem  Prozac  Amaryl  Imdur  Toprol XL    MVI  Protonix  Trental  Zocor  Spiriva  Assessment: 80 yo female with chest pain for heparin  Goal of Therapy:  Heparin level 0.3-0.7 units/ml Monitor platelets by anticoagulation protocol: Yes   Plan:  Heparin 3000 units IV bolus, then 850  units/hr Check heparin level in 8 hours.   Dung Prien, Gary FleetGregory Vernon 06/24/2014,3:55 AM

## 2014-06-24 NOTE — Progress Notes (Signed)
ANTICOAGULATION CONSULT NOTE - Follow-up Consult  Pharmacy Consult for Heparin Indication: chest pain/ACS  Allergies  Allergen Reactions  . Contrast Media [Iodinated Diagnostic Agents] Other (See Comments)    unknown  . Iohexol Rash and Other (See Comments)    Fever, unable to stand also    Patient Measurements: Height: 5' (152.4 cm) Weight: 168 lb 10.4 oz (76.5 kg) IBW/kg (Calculated) : 45.5 Heparin Dosing Weight: 60 kg  Vital Signs: Temp: 98.3 F (36.8 C) (10/19 1950) Temp Source: Oral (10/19 1950) BP: 132/58 mmHg (10/19 2230) Pulse Rate: 72 (10/19 2230)  Labs:  Recent Labs  06/24/14 0311 06/24/14 0508 06/24/14 1145 06/24/14 1650 06/24/14 2138  HGB 7.8*  --   --   --   --   HCT 23.8*  --   --   --   --   PLT 281  --   --   --   --   HEPARINUNFRC  --   --  0.24*  --  0.32  CREATININE 2.94*  --   --   --   --   TROPONINI  --  <0.30 0.36* 0.30*  --     Estimated Creatinine Clearance: 13.9 ml/min (by C-G formula based on Cr of 2.94).  Assessment: 80 yo female on heparin for chest pain.  Heparin level is now at goal (HL= 0.32) on 1000 units/hr.  Goal of Therapy:  Heparin level 0.3-0.7 units/ml Monitor platelets by anticoagulation protocol: Yes   Plan:  -No heparin changes needed -Daily heparin level and CBC  Harland GermanAndrew Kaho Selle, Pharm D 06/24/2014 10:52 PM

## 2014-06-24 NOTE — ED Notes (Signed)
PER EMS: pt from home and reports central CP for a few days that radiates to jaw. Pt took 3 baby aspirin and 1 nitro prior to EMS arrival and now patient is pain free. A&OX4, pt very hard of hearing.

## 2014-06-24 NOTE — Progress Notes (Signed)
PROGRESS NOTE  Subjective:   80 yo female with hx of CAD, PVD, stage IV CKD, admitted with progressive unstable angina. Has had pain despite medical therapy.   Currently , she tells me that she is pain free.  She told the nurse that she was having continued 8/10 pain.   Objective:    Vital Signs:   Temp:  [98.1 F (36.7 C)-99 F (37.2 C)] 98.1 F (36.7 C) (10/19 0827) Pulse Rate:  [78-95] 95 (10/19 0715) Resp:  [14-27] 21 (10/19 0715) BP: (106-157)/(48-110) 141/59 mmHg (10/19 0715) SpO2:  [92 %-99 %] 96 % (10/19 0715) Weight:  [163 lb 8 oz (74.163 kg)-168 lb 10.4 oz (76.5 kg)] 168 lb 10.4 oz (76.5 kg) (10/19 0557)  Last BM Date: 06/23/14   24-hour weight change: Weight change:   Weight trends: Filed Weights   06/24/14 0300 06/24/14 0557  Weight: 163 lb 8 oz (74.163 kg) 168 lb 10.4 oz (76.5 kg)    Intake/Output:  10/18 0701 - 10/19 0700 In: 13.1 [I.V.:13.1] Out: 400 [Urine:400]     Physical Exam: BP 141/59  Pulse 95  Temp(Src) 98.1 F (36.7 C) (Oral)  Resp 21  Ht 5' (1.524 m)  Wt 168 lb 10.4 oz (76.5 kg)  BMI 32.94 kg/m2  SpO2 96%  Wt Readings from Last 3 Encounters:  06/24/14 168 lb 10.4 oz (76.5 kg)  06/07/14 163 lb 8 oz (74.163 kg)  04/26/14 157 lb 8 oz (71.442 kg)    General: Vital signs reviewed and noted.   Head: Normocephalic, atraumatic.  Eyes: conjunctivae/corneas clear.  EOM's intact.   Throat: normal  Neck:  normal   Lungs:    + wheezing   Heart:  RR,   Abdomen:  Soft, non-tender, non-distended    Extremities: No edema    Neurologic: A&O X3, CN II - XII are grossly intact.   Psych: Normal     Labs: BMET:  Recent Labs  06/24/14 0311  NA 140  K 4.0  CL 103  CO2 21  GLUCOSE 163*  BUN 61*  CREATININE 2.94*  CALCIUM 9.2    Liver function tests: No results found for this basename: AST, ALT, ALKPHOS, BILITOT, PROT, ALBUMIN,  in the last 72 hours No results found for this basename: LIPASE, AMYLASE,  in the last 72  hours  CBC:  Recent Labs  06/24/14 0311  WBC 10.7*  HGB 7.8*  HCT 23.8*  MCV 90.2  PLT 281    Cardiac Enzymes:  Recent Labs  06/24/14 0508  TROPONINI <0.30    Coagulation Studies: No results found for this basename: LABPROT, INR,  in the last 72 hours  Other: No components found with this basename: POCBNP,  No results found for this basename: DDIMER,  in the last 72 hours No results found for this basename: HGBA1C,  in the last 72 hours No results found for this basename: CHOL, HDL, LDLCALC, TRIG, CHOLHDL,  in the last 72 hours No results found for this basename: TSH, T4TOTAL, FREET3, T3FREE, THYROIDAB,  in the last 72 hours No results found for this basename: VITAMINB12, FOLATE, FERRITIN, TIBC, IRON, RETICCTPCT,  in the last 72 hours   Other results:  EKG:  NSR ,  Mild ST depression   Medications:    Infusions: . heparin 850 Units/hr (06/24/14 0600)  . nitroGLYCERIN 20 mcg/min (06/24/14 0700)    Scheduled Medications: . antiseptic oral rinse  7 mL Mouth Rinse BID  . aspirin EC  81 mg Oral Daily  . atorvastatin  40 mg Oral q1800  . clopidogrel  75 mg Oral Q breakfast  . diltiazem  180 mg Oral Daily  . FLUoxetine  20 mg Oral Daily  . furosemide  40 mg Oral BID  . hydrALAZINE  10 mg Oral Once  . metoprolol succinate  25 mg Oral Daily  . oxybutynin  10 mg Oral QHS  . pantoprazole  40 mg Oral Daily  . pentoxifylline  400 mg Oral TID WC  . tiotropium  18 mcg Inhalation Daily    Assessment/ Plan:   Active Problems:   Unstable angina  1. Unstable angina: The patient presents with a weeklong history of unstable angina. We are in a very difficult spot. She has a creatinine of 2.94. Doing a heart catheterization would be at significant risk for renal failure. She does not want to have dialysis. She also does not have heart surgery. We discussed conservative therapy that may involve palliative care. We also discussed a more aggressive approach which would involve  a cardiac catheterization with possible antiplastic. She would like to pursue the conservative approach at this time.  Briefly discussed the case with her primary cardiologist- Dr.  Allyson SabalBerry.  He will try to run by and see her today and discuss this further. Continue IV ntg and heparin for now. Transition to Imdur after 48 hours of IV meds.  2. Stage IV CKD:  Following. Has RAS  3. PVD:     Disposition:  Length of Stay: 0  Vesta MixerPhilip J. Khan Chura, Montez HagemanJr., MD, [facility] 06/24/2014, 8:37 AM Office [phone] Pager [phone]

## 2014-06-24 NOTE — Progress Notes (Signed)
RN attempted to get report.

## 2014-06-24 NOTE — Significant Event (Signed)
CRITICAL VALUE ALERT  Critical value received:  Troponin 0.36  Date of notification:  06/24/14  Time of notification: 1242  Critical value read back: yes  Nurse who received alert:  Burnard BuntingKatrice Cristabel Bicknell, RN  MD notified (1st page):  Dr Elease HashimotoNahser  Time of first page: 1250  Time MD responded: 1255

## 2014-06-25 ENCOUNTER — Ambulatory Visit: Payer: Medicare Other | Admitting: Vascular Surgery

## 2014-06-25 ENCOUNTER — Other Ambulatory Visit (HOSPITAL_COMMUNITY): Payer: Medicare Other

## 2014-06-25 ENCOUNTER — Encounter (HOSPITAL_COMMUNITY): Payer: Self-pay | Admitting: Physician Assistant

## 2014-06-25 DIAGNOSIS — Z515 Encounter for palliative care: Secondary | ICD-10-CM

## 2014-06-25 DIAGNOSIS — N185 Chronic kidney disease, stage 5: Secondary | ICD-10-CM

## 2014-06-25 DIAGNOSIS — I25110 Atherosclerotic heart disease of native coronary artery with unstable angina pectoris: Secondary | ICD-10-CM | POA: Diagnosis not present

## 2014-06-25 DIAGNOSIS — I209 Angina pectoris, unspecified: Secondary | ICD-10-CM

## 2014-06-25 DIAGNOSIS — Z7189 Other specified counseling: Secondary | ICD-10-CM

## 2014-06-25 DIAGNOSIS — I214 Non-ST elevation (NSTEMI) myocardial infarction: Secondary | ICD-10-CM

## 2014-06-25 LAB — BASIC METABOLIC PANEL
Anion gap: 15 (ref 5–15)
BUN: 56 mg/dL — AB (ref 6–23)
CHLORIDE: 104 meq/L (ref 96–112)
CO2: 21 meq/L (ref 19–32)
Calcium: 9 mg/dL (ref 8.4–10.5)
Creatinine, Ser: 2.93 mg/dL — ABNORMAL HIGH (ref 0.50–1.10)
GFR calc Af Amer: 16 mL/min — ABNORMAL LOW (ref >90)
GFR, EST NON AFRICAN AMERICAN: 14 mL/min — AB (ref >90)
GLUCOSE: 166 mg/dL — AB (ref 70–99)
Potassium: 4.4 mEq/L (ref 3.7–5.3)
SODIUM: 140 meq/L (ref 137–147)

## 2014-06-25 LAB — CBC
HEMATOCRIT: 21.9 % — AB (ref 36.0–46.0)
Hemoglobin: 7.1 g/dL — ABNORMAL LOW (ref 12.0–15.0)
MCH: 29.3 pg (ref 26.0–34.0)
MCHC: 32.4 g/dL (ref 30.0–36.0)
MCV: 90.5 fL (ref 78.0–100.0)
Platelets: 280 10*3/uL (ref 150–400)
RBC: 2.42 MIL/uL — AB (ref 3.87–5.11)
RDW: 14.2 % (ref 11.5–15.5)
WBC: 16.6 10*3/uL — AB (ref 4.0–10.5)

## 2014-06-25 LAB — HEPARIN LEVEL (UNFRACTIONATED): HEPARIN UNFRACTIONATED: 0.21 [IU]/mL — AB (ref 0.30–0.70)

## 2014-06-25 LAB — PREPARE RBC (CROSSMATCH)

## 2014-06-25 MED ORDER — FUROSEMIDE 10 MG/ML IJ SOLN
40.0000 mg | Freq: Once | INTRAMUSCULAR | Status: AC
  Administered 2014-06-25: 40 mg via INTRAVENOUS
  Filled 2014-06-25: qty 4

## 2014-06-25 MED ORDER — SODIUM CHLORIDE 0.9 % IV SOLN
Freq: Once | INTRAVENOUS | Status: AC
  Administered 2014-06-25: 18:00:00 via INTRAVENOUS

## 2014-06-25 MED ORDER — MORPHINE SULFATE 2 MG/ML IJ SOLN
2.0000 mg | INTRAMUSCULAR | Status: DC | PRN
  Administered 2014-06-25 (×4): 2 mg via INTRAVENOUS
  Filled 2014-06-25 (×5): qty 1

## 2014-06-25 MED ORDER — DIPHENHYDRAMINE HCL 25 MG PO CAPS
25.0000 mg | ORAL_CAPSULE | Freq: Once | ORAL | Status: AC
  Administered 2014-06-25: 25 mg via ORAL
  Filled 2014-06-25: qty 1

## 2014-06-25 MED ORDER — METOPROLOL TARTRATE 1 MG/ML IV SOLN: 2.5000 mg | INTRAVENOUS | Status: DC | PRN

## 2014-06-25 MED ORDER — METOPROLOL TARTRATE 1 MG/ML IV SOLN
5.0000 mg | Freq: Once | INTRAVENOUS | Status: AC
  Administered 2014-06-25: 5 mg via INTRAVENOUS

## 2014-06-25 MED ORDER — MORPHINE SULFATE 2 MG/ML IJ SOLN
2.0000 mg | Freq: Once | INTRAMUSCULAR | Status: AC
  Administered 2014-06-25: 2 mg via INTRAVENOUS

## 2014-06-25 MED ORDER — METOPROLOL TARTRATE 1 MG/ML IV SOLN
INTRAVENOUS | Status: AC
  Filled 2014-06-25: qty 5

## 2014-06-25 MED ORDER — MORPHINE SULFATE 2 MG/ML IJ SOLN: 2.0000 mg | INTRAMUSCULAR | Status: DC | PRN

## 2014-06-25 MED ORDER — AMLODIPINE BESYLATE 5 MG PO TABS
5.0000 mg | ORAL_TABLET | Freq: Every day | ORAL | Status: DC
  Administered 2014-06-25 – 2014-06-26 (×2): 5 mg via ORAL
  Filled 2014-06-25 (×2): qty 1

## 2014-06-25 MED ORDER — METOPROLOL TARTRATE 25 MG PO TABS
25.0000 mg | ORAL_TABLET | Freq: Four times a day (QID) | ORAL | Status: DC
  Administered 2014-06-25 – 2014-06-26 (×3): 25 mg via ORAL
  Filled 2014-06-25 (×7): qty 1

## 2014-06-25 NOTE — Progress Notes (Signed)
Patient complained of chest pain 8/10, 2 mg Morphine given IV and Nitroglycerin drip increased.  Corliss SkainsJuan Montie Swiderski RN

## 2014-06-25 NOTE — Clinical Documentation Improvement (Signed)
Possible Clinical Conditions?   Acute Respiratory Failure Acute on Chronic Respiratory Failure Chronic Respiratory Failure Other Condition Cannot Clinically Determine    Risk Factors: Patient with a history of COPD, on home O2, also a history of OSA per 10/19 progress notes. Chronic respiratory failure w/hypoxia listed on snapshot view.    Thank You, Marciano SequinWanda Mathews-Bethea,RN,BSN, Clinical Documentation Specialist:  [phone]  [facility]- Health Information Management

## 2014-06-25 NOTE — Progress Notes (Signed)
Patient experiencing stated 9/10 pain in chest radiating to the jaw. Increased nitroglycerine drip by 5 to a total of 25mcg. Patient stated minimal relief was experienced. Enid SkeensSara Marinus Eicher, RN

## 2014-06-25 NOTE — Progress Notes (Signed)
ANTICOAGULATION CONSULT NOTE - Follow-up Consult  Pharmacy Consult for Heparin Indication: chest pain/ACS  Allergies  Allergen Reactions  . Contrast Media [Iodinated Diagnostic Agents] Other (See Comments)    unknown  . Iohexol Rash and Other (See Comments)    Fever, unable to stand also    Patient Measurements: Height: 5' (152.4 cm) Weight: 166 lb 14.2 oz (75.7 kg) IBW/kg (Calculated) : 45.5 Heparin Dosing Weight: 60 kg  Vital Signs: Temp: 98.8 F (37.1 C) (10/20 0358) Temp Source: Oral (10/20 0358) BP: 141/50 mmHg (10/20 0530) Pulse Rate: 76 (10/20 0530)  Labs:  Recent Labs  06/24/14 0311 06/24/14 0508 06/24/14 1145 06/24/14 1650 06/24/14 2138 06/25/14 0234  HGB 7.8*  --   --   --   --  7.1*  HCT 23.8*  --   --   --   --  21.9*  PLT 281  --   --   --   --  280  HEPARINUNFRC  --   --  0.24*  --  0.32 0.21*  CREATININE 2.94*  --   --   --   --  2.93*  TROPONINI  --  <0.30 0.36* 0.30*  --   --     Estimated Creatinine Clearance: 13.9 ml/min (by C-G formula based on Cr of 2.93).  Assessment: 80 yo female on heparin for chest pain. Pt has been ruled out for MI but has ST segment changes per cards note. Plan for no further aggressive intervention - conservative medical approach. Plan to d/c IV heparin and IV NTG today. Heparin level is now subtherapeutic (HL= 0.21) on 1000 units/hr. Hgb 7.1 - trending down (baseline ~8 over past few mos), plt ok. No bleeding noted.  Goal of Therapy:  Heparin level 0.3-0.7 units/ml Monitor platelets by anticoagulation protocol: Yes   Plan:  -Increase heparin gtt to 1150 units/hr -Likely will not need f/u level as plan to d/c heparin today  Megan Mathews, PharmD, BCPS Clinical pharmacist, pager [phone]   06/25/2014 7:56 AM

## 2014-06-25 NOTE — Progress Notes (Signed)
Patient experiencing 9/10 chest pain radiating to the jaw. Paged on call cardiologist. Enid SkeensSara Yelina Sarratt, RN

## 2014-06-25 NOTE — Progress Notes (Signed)
Dr. Allyson SabalBerry from Cardiology made aware patient complaining of chest pain 8/10, per MD order give 2 mg Morphine IV q4 to 6 hrs. Increased nitroglycerin drip.  Corliss SkainsJuan Ruther Ephraim RN

## 2014-06-25 NOTE — Progress Notes (Signed)
CARDIOLOGY  NOTE   Patient ID: Megan Mathews MRN: [id number] DOB/AGE: 78/02/1932 78 y.o.  Admit date: 06/24/2014  Primary Cardiologist   Dr. Allyson SabalBerry Reason for Consultation   chest pain  Patient Profile :Megan Mathews is a 80 y.o. female with long history of CAD, s/p multiple PCIs, stage IV CKD, OSA, DM, HTN, HLD, pulm HTN, who was admitted 10/19 with chest pain.   She was seen on 10/19 by Dr. Allyson SabalBerry, his note is reproduced in full:  "I was asked to talk with Megan Mathews who is a long term patient of mine. She has known CAD with stenting of all 3 coronary arteries as well as moderate LM disease and moderately severe renal insuffiencey. She was admitted with accelerated angina and has ruled out for MI but does have ST segment changes. She is on IV hep as well as escalating doses of IV NTG. After a long discussion she has decided not to pursue an aggressive/ interventional approach which I can not disagree with. She prefers a conservative medical approach with pain management. I think then that it is reasonable to obtain a palliative care consult. D/C the IV hep/NTG and transfer to a non tele floor tomorrow."  Today, however, she had an episode of chest pain this a.m. that was relieved by 2 mg of morphine IV. She did well with this and the pain resolved. This p.m., her nitroglycerin was weaned down to 20 mcg per minute. An hour or so later, she had onset of 9/10 chest pain that was unrelenting and not relieved by IV morphine. Her ECG was abnormal compared to her previous ECG. Cardiology was asked to formally evaluate her.  Megan. Julien Mathews currently states that the pain is leaving. She is confused and not able to state how long the pain is been going on, if it is like her usual angina, if this is the pain she came in with, or give any other information about it. She appears short of breath and has a slight wheeze. Her oxygen saturation is good. She also seems very anxious about the pain but is  gradually calming down.  As we spoke with her, she seemed to get more comfortable. She then stated she wanted to sleep and was able to doze off. Wheezing was no longer heard.  I have reviewed the patient's current medications . antiseptic oral rinse  7 mL Mouth Rinse BID  . aspirin EC  81 mg Oral Daily  . atorvastatin  40 mg Oral q1800  . clopidogrel  75 mg Oral Q breakfast  . diltiazem  180 mg Oral Daily  . FLUoxetine  20 mg Oral Daily  . furosemide  40 mg Oral BID  . hydrALAZINE  10 mg Oral Once  . metoprolol succinate  25 mg Oral Daily  . oxybutynin  10 mg Oral QHS  . pantoprazole  40 mg Oral Daily  . pentoxifylline  400 mg Oral TID WC  . tiotropium  18 mcg Inhalation Daily   . heparin 1,150 Units/hr (06/25/14 1400)  . nitroGLYCERIN 20 mcg/min (06/25/14 1400)   acetaminophen, albuterol, guaiFENesin, morphine injection, nitroGLYCERIN, ondansetron (ZOFRAN) IV  Physical Exam: Blood pressure 112/93, pulse 97, temperature 98.6 F (37 C), temperature source Oral, resp. rate 28, height 5' (1.524 m), weight 166 lb 14.2 oz (75.7 kg), SpO2 92.00%.  General: Well developed, well nourished, female in mild to moderate distress Head: Eyes PERRLA, No xanthomas.   Normocephalic and atraumatic, oropharynx without  edema or exudate. Dentition: Poor  Lungs: Slight expiratory wheeze, rales bases Heart: HRRR S1 S2, no rub/gallop, soft murmur at left upper sternal border. pulses are 2+ in all 4 extrem.   Neck: No carotid bruits. No lymphadenopathy.  JVD minimal elevation. Abdomen: Bowel sounds present, abdomen soft and non-tender without masses or hernias noted. Msk:  No spine or cva tenderness. No weakness, no joint deformities or effusions. Extremities: No clubbing or cyanosis. No edema.  Neuro: Alert and oriented X 2. No focal deficits noted. Psych:  Good affect, responds appropriately Skin: No rashes or lesions noted.  Labs:   Lab Results  Component Value Date   WBC 16.6* 06/25/2014   HGB  7.1* 06/25/2014   HCT 21.9* 06/25/2014   MCV 90.5 06/25/2014   PLT 280 06/25/2014      Recent Labs Lab 06/25/14 0234  NA 140  K 4.4  CL 104  CO2 21  BUN 56*  CREATININE 2.93*  CALCIUM 9.0  GLUCOSE 166*    Recent Labs  06/24/14 0508 06/24/14 1145 06/24/14 1650  TROPONINI <0.30 0.36* 0.30*    Recent Labs  06/24/14 0316  TROPIPOC 0.02   Echo: 02/22/2014 Study Conclusions - Left ventricle: The cavity size was normal. Wall thickness was normal. Systolic function was normal. The estimated ejection fraction was in the range of 50% to 55%. Wall motion was normal; there were no regional wall motion abnormalities. Features are consistent with a pseudonormal left ventricular filling pattern, with concomitant abnormal relaxation and increased filling pressure (grade 2 diastolic dysfunction). - Aortic valve: There was trivial regurgitation. - Mitral valve: Calcified annulus. There was mild regurgitation. - Left atrium: The atrium was moderately dilated. - Right atrium: The atrium was mildly dilated. - Pulmonary arteries: Systolic pressure was moderately increased. PA peak pressure: 48 mm Hg (S). Impressions: - Normal LV function; biatrial enlargement; mild MR and TR; trace AI; moderately elevated pulmonary pressures.  ECG:  06/24/2014  Sinus rhythm, inferior and lateral ST changes consistent with ischemia, significantly worse from an ECG 06/24/2014  Radiology:  Dg Chest 2 View 06/24/2014   CLINICAL DATA:  Central chest pain for a few days radiating to the jaw.  EXAM: CHEST  2 VIEW  COMPARISON:  01/16/2013  FINDINGS: Borderline heart size with mild pulmonary vascular congestion. Nodule or focal infiltration suggested in the left superior hilar region. Consider chest CT for further evaluation of this finding. This measures about 2.6 cm in diameter and probably localizes posteriorly. Mild emphysematous changes in the lungs. Fluid or thickened pleura in the left costophrenic  angle. No pneumothorax. Calcification of the aorta. Surgical clips in the base of the neck.  IMPRESSION: Borderline heart size with mild pulmonary vascular congestion. Developing nodule versus focal infiltration in the left suprahilar region. Consider CT for further evaluation.   Electronically Signed   By: Burman Nieves M.D.   On: 06/24/2014 03:31    ASSESSMENT AND PLAN:   The patient was seen today by Dr. Tresa Endo, the patient evaluated and the data reviewed.   Principal Problem:   Unstable angina - she is currently on IV heparin and IV nitroglycerin. Per Dr. Hazle Coca note, non-interventional approach planned.   Palliative care has seen, speaking with the patient and one of her daughters. Per the RN, a meeting with the patient and both her daughters is planned for a.m.   We'll continue medical therapy, will liberalize the use of morphine and titrate the nitroglycerin to manage symptoms. Increase metoprolol and substitute amlodipine  for Cardizem. Continue her on telemetry for now. Once the final decision/plan has been made in concert with palliative care, oral medications can be adjusted to control symptoms.  Active Problems:   DM (diabetes mellitus) type II controlled, neurological manifestation - add SSI    OSA (obstructive sleep apnea) - CPAP per Resp as pt will tolerate    COPD, moderate - continue home medications.    Chronic respiratory failure with hypoxia - titrate O2 to keeps sats > 92%    Anemia of chronic renal failure - Transfuse 1 unit PRBCs and follow H&H, may have to d/c heparin.    HTN (hypertension) - see med changes above, follow    CKD (chronic kidney disease) stage 4, GFR 15-29 ml/min - avoid dye use, ACE/ARB. Follow   Signed: Theodore DemarkRhonda Barrett, PA-C 06/25/2014 3:39 PM Beeper [phone]   Patient seen and examined. Agree with assessment and plan. Pt now with unstable angina associated with new marked ST depression changes. When I went into the room she was  experiencing recurrent chest pain; HR 110. IV NTG was titrated from 30 to 40 to 50 micgrogram, IV Lopressor 2.5 mg x2 given and additional MSO4 administered. HR improved to 90, ECG ST depression slightly lessened and chest pain improved. Cr 2.9. Not felt to be candidate for emergent cath as noted in prior notes. Hb 7.1 undoubtedly is playing a role in her instability. Will transfuse 1 unit PRBC and give IV Lasix post transfusion. Will add amlodipine and dc cardizem to regimen. BB changed from Toprol XL 25 mg to metoprolol tartrate 25 mg every 6 hrs.   Time spent 40 minutes Lennette Biharihomas A. Kelly, MD, [facility] 06/25/2014 5:42 PM

## 2014-06-25 NOTE — Consult Note (Deleted)
Error

## 2014-06-25 NOTE — Progress Notes (Signed)
Patient experiencing 9/10 chest pain. Nitroglycerin drip increased by 5 to 30mcg. Enid SkeensSara Suhana Wilner, RN

## 2014-06-25 NOTE — Progress Notes (Signed)
Patient continues to experience 9/10 chest pain radiating to the jaw. 12 lead EKG performed. Enid SkeensSara Veryl Winemiller, RN

## 2014-06-25 NOTE — Plan of Care (Signed)
Problem: Phase I Progression Outcomes Goal: Anginal pain relieved Outcome: Progressing Patient has intermittent chest pain, she is currently on a nitroglycerin drip and has prn morphine.

## 2014-06-25 NOTE — Progress Notes (Signed)
Paged cardiologist on call for second time for stated patient chest pain 9/10. Cardiologist is sending PA over to evaluate patient. Enid SkeensSara Kymorah Korf, RN

## 2014-06-25 NOTE — Progress Notes (Signed)
Patient states that pain is subsiding and is able to rest. Pain stated at a 0/10 at this time. Enid SkeensSara Kiari Hosmer, RN

## 2014-06-25 NOTE — Consult Note (Signed)
Patient WU:JWJX:Megan Mathews      DOB: 07/14/1932      BJY:[id number]     Consult Note from the Palliative Medicine Team at [facility]    Consult Requested by:   Dr Megan Mathews     PCP: Kerby NoraAmy Bedsole, MD Reason for Consultation:Clarification of GOC and options     Phone [phone]  Assessment of patients Current state:   Continued physical and functional decline over the past months secondary to her CAD and severe renal insuffiencey.  Patient and family faced with advanced directive decisions and anticipatory care needs.  Consult is for review of medical treatment options, clarification of goals of care and end of life issues, disposition and options, and symptom recommendation.  This NP Megan Mathews reviewed medical records, received report from team, assessed the patient and then meet at the patient's bedside  to discuss diagnosis prognosis, GOC, EOL wishes disposition and options.  A detailed discussion was had today regarding advanced directives.  Concepts specific to code status, continued IV medications/ antibiotics and rehospitalization was had.  The difference between a aggressive medical intervention path  and a palliative comfort care path for this patient at this time was had.  Values and goals of care important to patient and family were attempted to be elicited.  Concept of Hospice and Palliative Care were discussed  Questions and concerns addressed.   Discussed importance of family support and input into this important discussion in clarifying goals of care.  Spoke with daughter Megan Mathews and we have scheduled a f/u meeting for tomorrow at 2:30 pm with both daughters to be present.  PMT will continue to support holistically.   Goals of Care: 1.  Code Status:  Full code   2. Scope of Treatment:  Continue present medical management of chronic disease.  Pateint has expressed her wishes to Dr Megan Mathews that she desires a less aggressive approach to her treatment plan.  She  remains hopeful for continued quality of life.  We will detail her plan of care further at family meeting  tomorrow   3. Disposition:  Pending outcomes   4. Symptom Management:   1. Pain/CP: morphine 2 mg IV every 4 hrs prn 2.  Weakness/Fatigue: continued medical management of chronic disease  5. Psychosocial:  Emotional support offered to patient. She tells me her main worry is her daughter Megan Mathews, who has many medical issues and related financial issues as well.  Megan Mathews is comfortable talking about her medical situation and to the fact she "is not afraid to die".  She is open to further conversation including her two daughters tomorrow afternoon.    Brief HPI: Megan Mathews is a 80 y.o. female with significant vascular disease both peripheral and coronary s/p multiple PCIs, stage IV CKD, who presented with chest pain. For the past 36 hours she  had a pressure/burning pain in her chest that radiates to her jaw. No associated dyspnea, nausea, diaphoresis. Discomfort not relieved by nitroglycerin. She took aspirin after calling 911 and her pain improved.  In ED  heparin gtt was started.   Now on nitroglycerin gtt and prn Morphine for pain Faced with advanced directive decisions, treatment options and dc planning  ROS:  Weakness, intermittent chest pain   PMH:  Past Medical History  Diagnosis Date  . COPD (chronic obstructive pulmonary disease)     home 02  . OSA (obstructive sleep apnea)   . Hypoxemia   . Secondary pulmonary hypertension 8/09    "  severe" by echo  . CAD (coronary artery disease) '95,'96,'98.'99,'08    Multiple PCI/stenting all 3 vessels  . Diabetes mellitus type II   . Hyperlipidemia   . Hypertension   . Peripheral vascular disease '96, July 2010    AoIliac stenting with PTA for ISR 7/10  . Tobacco abuse     quit '99  . Constipation   . Anxiety   . Depression   . Artery stenosis     renal   bilateral  . Gastritis 04/2011    mild gastritis on EGD  .  NSTEMI (non-ST elevated myocardial infarction) 01/02/13    in setting of PSVT, +Trop, no ECG change, no cath  . Carotid artery disease     bilateral carotid endarterectomies with redo on the left     PSH: Past Surgical History  Procedure Laterality Date  . Carotid endarterectomy      bilat with redo on Lt  . Aaa stent  10/96  . Bilateral iliac stenting  10/96, 7/10  . Resection of vocal chord lesions    . Excision serous cyst adenofibroma    . Coronary angioplasty  2/95    RCA  . Coronary angioplasty with stent placement  8/96    RCA  . Coronary angioplasty with stent placement  5/98    RCA X 2  . Coronary angioplasty with stent placement  11/99    CFX,LAD,Dx  . Coronary angiogram  12/08    patent stents, 40% LM  . Thoracotomy Left 1960s    removal of "glandular tumor" (pt's words)  . Total abdominal hysterectomy     I have reviewed the FH and SH and  If appropriate update it with new information. Allergies  Allergen Reactions  . Contrast Media [Iodinated Diagnostic Agents] Other (See Comments)    unknown  . Iohexol Rash and Other (See Comments)    Fever, unable to stand also   Scheduled Meds: . antiseptic oral rinse  7 mL Mouth Rinse BID  . aspirin EC  81 mg Oral Daily  . atorvastatin  40 mg Oral q1800  . clopidogrel  75 mg Oral Q breakfast  . diltiazem  180 mg Oral Daily  . FLUoxetine  20 mg Oral Daily  . furosemide  40 mg Oral BID  . hydrALAZINE  10 mg Oral Once  . Influenza vac split quadrivalent PF  0.5 mL Intramuscular Tomorrow-1000  . metoprolol succinate  25 mg Oral Daily  . oxybutynin  10 mg Oral QHS  . pantoprazole  40 mg Oral Daily  . pentoxifylline  400 mg Oral TID WC  . tiotropium  18 mcg Inhalation Daily   Continuous Infusions: . heparin 1,150 Units/hr (06/25/14 0900)  . nitroGLYCERIN 35 mcg/min (06/25/14 0900)   PRN Meds:.acetaminophen, albuterol, guaiFENesin, morphine injection, nitroGLYCERIN, ondansetron (ZOFRAN) IV   BP 115/69  Pulse 77   Temp(Src) 98.1 F (36.7 C) (Oral)  Resp 22  Ht 5' (1.524 m)  Wt 75.7 kg (166 lb 14.2 oz)  BMI 32.59 kg/m2  SpO2 98%   PPS: 30 %   Intake/Output Summary (Last 24 hours) at 06/25/14 0954 Last data filed at 06/25/14 0900  Gross per 24 hour  Intake 759.73 ml  Output   1550 ml  Net -790.27 ml     Physical Exam:  General:  Chronically ill appearing, NAD HEENT:  Dry buccal membranes, no exudate Chest:   Decreased in bases, CTA CVS: RRR Abdomen:soft NT +BS Ext: without edema Neuro: alert and  oriented X3, fully engaged in conversation   Labs: CBC    Component Value Date/Time   WBC 16.6* 06/25/2014 0234   RBC 2.42* 06/25/2014 0234   RBC 2.62* 01/06/2013 0553   HGB 7.1* 06/25/2014 0234   HCT 21.9* 06/25/2014 0234   PLT 280 06/25/2014 0234   MCV 90.5 06/25/2014 0234   MCH 29.3 06/25/2014 0234   MCHC 32.4 06/25/2014 0234   RDW 14.2 06/25/2014 0234   LYMPHSABS 1.3 04/26/2014 1345   MONOABS 0.3 04/26/2014 1345   EOSABS 0.3 04/26/2014 1345   BASOSABS 0.0 04/26/2014 1345    BMET    Component Value Date/Time   NA 140 06/25/2014 0234   K 4.4 06/25/2014 0234   CL 104 06/25/2014 0234   CO2 21 06/25/2014 0234   GLUCOSE 166* 06/25/2014 0234   BUN 56* 06/25/2014 0234   CREATININE 2.93* 06/25/2014 0234   CALCIUM 9.0 06/25/2014 0234   GFRNONAA 14* 06/25/2014 0234   GFRAA 16* 06/25/2014 0234    CMP     Component Value Date/Time   NA 140 06/25/2014 0234   K 4.4 06/25/2014 0234   CL 104 06/25/2014 0234   CO2 21 06/25/2014 0234   GLUCOSE 166* 06/25/2014 0234   BUN 56* 06/25/2014 0234   CREATININE 2.93* 06/25/2014 0234   CALCIUM 9.0 06/25/2014 0234   PROT 6.4 02/26/2014 1053   ALBUMIN 3.3* 02/26/2014 1053   AST 14 02/26/2014 1053   ALT 14 02/26/2014 1053   ALKPHOS 36* 02/26/2014 1053   BILITOT 0.4 02/26/2014 1053   GFRNONAA 14* 06/25/2014 0234   GFRAA 16* 06/25/2014 0234    Time In Time Out Total Time Spent with Patient Total Overall Time  0900 1015 70 min 75 min     Greater than 50%  of this time was spent counseling and coordinating care related to the above assessment and plan.   Megan Creed NP  Palliative Medicine Team Team Phone # [phone] Pager [phone]

## 2014-06-25 NOTE — Plan of Care (Signed)
Problem: Consults Goal: Chest Pain Patient Education (See Patient Education module for education specifics.)  Patient educated on current therapies for chest pain, patient spoke with MD about disease process and plan of care. Morphine ordered for pain as well as titration of nitroglycerin drip.  Corliss SkainsJuan Elam Ellis RN

## 2014-06-25 NOTE — Progress Notes (Signed)
Patient experiencing severe (stated 9/10) chest pain radiating to the jaw. 2mg  of IV morphine administered through right PIV. Patient stated that minimal relief was experienced. Enid SkeensSara Griffyn Kucinski, RN

## 2014-06-26 DIAGNOSIS — Z7189 Other specified counseling: Secondary | ICD-10-CM

## 2014-06-26 DIAGNOSIS — R079 Chest pain, unspecified: Secondary | ICD-10-CM

## 2014-06-26 DIAGNOSIS — Z515 Encounter for palliative care: Secondary | ICD-10-CM

## 2014-06-26 LAB — TYPE AND SCREEN
ABO/RH(D): O POS
Antibody Screen: NEGATIVE
UNIT DIVISION: 0

## 2014-06-26 LAB — GLUCOSE, CAPILLARY: Glucose-Capillary: 137 mg/dL — ABNORMAL HIGH (ref 70–99)

## 2014-06-26 LAB — CBC
HCT: 25.7 % — ABNORMAL LOW (ref 36.0–46.0)
HEMOGLOBIN: 8.4 g/dL — AB (ref 12.0–15.0)
MCH: 29.8 pg (ref 26.0–34.0)
MCHC: 32.7 g/dL (ref 30.0–36.0)
MCV: 91.1 fL (ref 78.0–100.0)
Platelets: 243 10*3/uL (ref 150–400)
RBC: 2.82 MIL/uL — ABNORMAL LOW (ref 3.87–5.11)
RDW: 14.6 % (ref 11.5–15.5)
WBC: 15.3 10*3/uL — ABNORMAL HIGH (ref 4.0–10.5)

## 2014-06-26 LAB — HEPARIN LEVEL (UNFRACTIONATED): Heparin Unfractionated: 0.33 IU/mL (ref 0.30–0.70)

## 2014-06-26 MED ORDER — ISOSORBIDE MONONITRATE ER 60 MG PO TB24
60.0000 mg | ORAL_TABLET | Freq: Every day | ORAL | Status: DC
  Administered 2014-06-26 – 2014-06-28 (×3): 60 mg via ORAL
  Filled 2014-06-26 (×4): qty 1

## 2014-06-26 MED ORDER — MORPHINE SULFATE 2 MG/ML IJ SOLN
2.0000 mg | INTRAMUSCULAR | Status: DC | PRN
  Administered 2014-06-26 – 2014-06-27 (×6): 2 mg via INTRAVENOUS
  Filled 2014-06-26 (×6): qty 1

## 2014-06-26 NOTE — Progress Notes (Signed)
PROGRESS NOTE  Subjective:   80 yo female with hx of CAD, PVD, stage IV CKD, admitted with progressive unstable angina. Has had pain despite medical therapy.   She has been having lots of angina. Requiring high dose IV NTG. Limited by her low BP. Palliative care has been consulted.    Objective:    Vital Signs:   Temp:  [98 F (36.7 C)-100.3 F (37.9 C)] 98 F (36.7 C) (10/21 1242) Pulse Rate:  [64-102] 73 (10/21 1200) Resp:  [17-31] 17 (10/21 1200) BP: (79-145)/(33-93) 79/53 mmHg (10/21 1200) SpO2:  [86 %-100 %] 97 % (10/21 1200) FiO2 (%):  [50 %] 50 % (10/21 1200)  Last BM Date: 06/23/14   24-hour weight change: Weight change:   Weight trends: Filed Weights   06/24/14 0300 06/24/14 0557 06/25/14 0509  Weight: 163 lb 8 oz (74.163 kg) 168 lb 10.4 oz (76.5 kg) 166 lb 14.2 oz (75.7 kg)    Intake/Output:  10/20 0701 - 10/21 0700 In: 1874.2 [P.O.:830; I.V.:709.2; Blood:335] Out: 1675 [Urine:1675] Total I/O In: 372.5 [P.O.:240; I.V.:132.5] Out: 250 [Urine:250]   Physical Exam: BP 79/53  Pulse 73  Temp(Src) 98 F (36.7 C) (Oral)  Resp 17  Ht 5' (1.524 m)  Wt 166 lb 14.2 oz (75.7 kg)  BMI 32.59 kg/m2  SpO2 97%  Wt Readings from Last 3 Encounters:  06/25/14 166 lb 14.2 oz (75.7 kg)  06/07/14 163 lb 8 oz (74.163 kg)  04/26/14 157 lb 8 oz (71.442 kg)    General: Vital signs reviewed and noted.  Elderly female,   Head: Normocephalic, atraumatic.  Eyes: conjunctivae/corneas clear.  EOM's intact.   Throat: normal  Neck:  normal   Lungs:   Lungs are clear  Heart:  RR,   Abdomen:  Soft, non-tender, non-distended    Extremities: No edema    Neurologic: A&O X3, CN II - XII are grossly intact.   Psych: Normal     Labs: BMET:  Recent Labs  06/24/14 0311 06/25/14 0234  NA 140 140  K 4.0 4.4  CL 103 104  CO2 21 21  GLUCOSE 163* 166*  BUN 61* 56*  CREATININE 2.94* 2.93*  CALCIUM 9.2 9.0    Liver function tests: No results found for this  basename: AST, ALT, ALKPHOS, BILITOT, PROT, ALBUMIN,  in the last 72 hours No results found for this basename: LIPASE, AMYLASE,  in the last 72 hours  CBC:  Recent Labs  06/25/14 0234 06/26/14 0220  WBC 16.6* 15.3*  HGB 7.1* 8.4*  HCT 21.9* 25.7*  MCV 90.5 91.1  PLT 280 243    Cardiac Enzymes:  Recent Labs  06/24/14 0508 06/24/14 1145 06/24/14 1650  TROPONINI <0.30 0.36* 0.30*    Coagulation Studies: No results found for this basename: LABPROT, INR,  in the last 72 hours  Other: No components found with this basename: POCBNP,  No results found for this basename: DDIMER,  in the last 72 hours No results found for this basename: HGBA1C,  in the last 72 hours No results found for this basename: CHOL, HDL, LDLCALC, TRIG, CHOLHDL,  in the last 72 hours No results found for this basename: TSH, T4TOTAL, FREET3, T3FREE, THYROIDAB,  in the last 72 hours No results found for this basename: VITAMINB12, FOLATE, FERRITIN, TIBC, IRON, RETICCTPCT,  in the last 72 hours   Other results:  EKG:  NSR ,  Mild ST depression   Medications:    Infusions: . heparin  1,150 Units/hr (06/26/14 0533)  . nitroGLYCERIN 40 mcg/min (06/26/14 1251)    Scheduled Medications: . amLODipine  5 mg Oral Daily  . antiseptic oral rinse  7 mL Mouth Rinse BID  . aspirin EC  81 mg Oral Daily  . atorvastatin  40 mg Oral q1800  . clopidogrel  75 mg Oral Q breakfast  . FLUoxetine  20 mg Oral Daily  . furosemide  40 mg Oral BID  . hydrALAZINE  10 mg Oral Once  . metoprolol tartrate  25 mg Oral 4 times per day  . oxybutynin  10 mg Oral QHS  . pantoprazole  40 mg Oral Daily  . pentoxifylline  400 mg Oral TID WC  . tiotropium  18 mcg Inhalation Daily    Assessment/ Plan:   Principal Problem:   Unstable angina Active Problems:   DM (diabetes mellitus) type II controlled, neurological manifestation   OSA (obstructive sleep apnea)   COPD, moderate   Chronic respiratory failure with hypoxia   Anemia  of chronic renal failure   HTN (hypertension)   CKD (chronic kidney disease) stage 4, GFR 15-29 ml/min   DNR (do not resuscitate) discussion   Palliative care encounter   Chest pain  1. Unstable angina: The patient presents with a weeklong history of unstable angina. We are in a very difficult spot. She has a creatinine of 2.93.   Doing a heart catheterization would be at significant risk for renal failure. She does not want to have dialysis. She also does not have heart surgery.   Palliative care has been consulted.   Family meeting today.    Continue meds for now  We really do not have much to offer her except comfort care.  2. Stage IV CKD:  Following. Has RAS  3. PVD:     Disposition:  Length of Stay: 2  Alvia GrovePhilip J. Amillia Biffle, Jr., MD, [facility]ACC 06/26/2014, 12:58 PM Office [phone] Pager [phone]

## 2014-06-26 NOTE — Consult Note (Signed)
Progress Note from the Palliative Medicine Team at [facility]  Subjective:  -patient is alert and oriented X3  -two daughters at bedside, continued conversation regarding diagnosis, prognosis, GOC, EOL wishes, disposition, options  -focus of care is comfort, with hope of getting home with hospice services in place    Objective: Allergies  Allergen Reactions  . Contrast Media [Iodinated Diagnostic Agents] Other (See Comments)    unknown  . Iohexol Rash and Other (See Comments)    Fever, unable to stand also   Scheduled Meds: . amLODipine  5 mg Oral Daily  . antiseptic oral rinse  7 mL Mouth Rinse BID  . FLUoxetine  20 mg Oral Daily  . furosemide  40 mg Oral BID  . hydrALAZINE  10 mg Oral Once  . metoprolol tartrate  25 mg Oral 4 times per day  . oxybutynin  10 mg Oral QHS  . pantoprazole  40 mg Oral Daily  . pentoxifylline  400 mg Oral TID WC  . tiotropium  18 mcg Inhalation Daily   Continuous Infusions: . heparin 1,150 Units/hr (06/26/14 0533)  . nitroGLYCERIN 40 mcg/min (06/26/14 1251)   PRN Meds:.acetaminophen, albuterol, guaiFENesin, metoprolol, morphine injection, nitroGLYCERIN, ondansetron (ZOFRAN) IV  BP 105/30  Pulse 69  Temp(Src) 99.3 F (37.4 C) (Oral)  Resp 22  Ht 5' (1.524 m)  Wt 75.7 kg (166 lb 14.2 oz)  BMI 32.59 kg/m2  SpO2 97%   PPS:30 %   Pain Score: denies presently,  Pain Location intermittent chest pain   Intake/Output Summary (Last 24 hours) at 06/26/14 1519 Last data filed at 06/26/14 1400  Gross per 24 hour  Intake 1353.68 ml  Output   1500 ml  Net -146.32 ml       Physical Exam:  General: chronically ill appearing, NAD HEENT:  Dry buccal membranes, no exudate Chest:    Decreased in bases, CTA CVS:RRR Abdomen: soft NT +BS Ext: without edema Neuro: moves all four, orietned X3  Labs: CBC    Component Value Date/Time   WBC 15.3* 06/26/2014 0220   RBC 2.82* 06/26/2014 0220   RBC 2.62* 01/06/2013 0553   HGB 8.4* 06/26/2014  0220   HCT 25.7* 06/26/2014 0220   PLT 243 06/26/2014 0220   MCV 91.1 06/26/2014 0220   MCH 29.8 06/26/2014 0220   MCHC 32.7 06/26/2014 0220   RDW 14.6 06/26/2014 0220   LYMPHSABS 1.3 04/26/2014 1345   MONOABS 0.3 04/26/2014 1345   EOSABS 0.3 04/26/2014 1345   BASOSABS 0.0 04/26/2014 1345    BMET    Component Value Date/Time   NA 140 06/25/2014 0234   K 4.4 06/25/2014 0234   CL 104 06/25/2014 0234   CO2 21 06/25/2014 0234   GLUCOSE 166* 06/25/2014 0234   BUN 56* 06/25/2014 0234   CREATININE 2.93* 06/25/2014 0234   CALCIUM 9.0 06/25/2014 0234   GFRNONAA 14* 06/25/2014 0234   GFRAA 16* 06/25/2014 0234    CMP     Component Value Date/Time   NA 140 06/25/2014 0234   K 4.4 06/25/2014 0234   CL 104 06/25/2014 0234   CO2 21 06/25/2014 0234   GLUCOSE 166* 06/25/2014 0234   BUN 56* 06/25/2014 0234   CREATININE 2.93* 06/25/2014 0234   CALCIUM 9.0 06/25/2014 0234   PROT 6.4 02/26/2014 1053   ALBUMIN 3.3* 02/26/2014 1053   AST 14 02/26/2014 1053   ALT 14 02/26/2014 1053   ALKPHOS 36* 02/26/2014 1053   BILITOT 0.4 02/26/2014 1053  GFRNONAA 14* 06/25/2014 0234   GFRAA 16* 06/25/2014 0234     Assessment and Plan: 1. Code Status: DNR/DNi-comfort is main focus of care 2. Symptom Control:  Spoke with pharmacist Lanora ManisElizabeth Chest pain:   -Wean patient off Nitro gtt in anticipation of dc home (see nursing order)   - Imdur ER 60 mg po daily -  Morphine 1 mg every hr prn for chest pain/dyspnea -transition to nasal cannula, may utilize up to 6 liters for comfort  3. Psycho/Social:  Emotional support offered to patient and her family.  All recognize the overall poor prognosis and hope is for comfort and dignity  4. Spiritual  Strong faith is support for patient and family 5. Disposition:  Hopefully home with hospcie services, will write for choice  Patient Documents Completed or Given: Document Given Completed  Advanced Directives Pkt    MOST X   DNR  X  Gone from My Sight    Hard  Choices X     Time In Time Out Total Time Spent with Patient Total Overall Time  1430 1600      Greater than 50%  of this time was spent counseling and coordinating care related to the above assessment and plan.  Lorinda CreedMary Andru Genter NP  Palliative Medicine Team Team Phone # [phone] Pager [phone]  Discussed above with Dr Carney HarderNashar and he is in agreement with a full comfort approach with hope of getting patient home with hospice services 1

## 2014-06-26 NOTE — Progress Notes (Signed)
Rn called RT about sats dropping. Patient was on [id number] sats were 86. RT placed patient on Venti mask 12L 50%. Sats are now 97%.

## 2014-06-26 NOTE — Progress Notes (Signed)
ANTICOAGULATION CONSULT NOTE - Follow-up Consult  Pharmacy Consult for Heparin Indication: chest pain/ACS  Allergies  Allergen Reactions  . Contrast Media [Iodinated Diagnostic Agents] Other (See Comments)    unknown  . Iohexol Rash and Other (See Comments)    Fever, unable to stand also    Patient Measurements: Height: 5' (152.4 cm) Weight: 166 lb 14.2 oz (75.7 kg) IBW/kg (Calculated) : 45.5 Heparin Dosing Weight: 60 kg  Vital Signs: Temp: 98 F (36.7 C) (10/21 1242) Temp Source: Oral (10/21 1242) BP: 79/53 mmHg (10/21 1200) Pulse Rate: 73 (10/21 1200)  Labs:  Recent Labs  06/24/14 0311 06/24/14 0508  06/24/14 1145 06/24/14 1650 06/24/14 2138 06/25/14 0234 06/26/14 0220  HGB 7.8*  --   --   --   --   --  7.1* 8.4*  HCT 23.8*  --   --   --   --   --  21.9* 25.7*  PLT 281  --   --   --   --   --  280 243  HEPARINUNFRC  --   --   < > 0.24*  --  0.32 0.21* 0.33  CREATININE 2.94*  --   --   --   --   --  2.93*  --   TROPONINI  --  <0.30  --  0.36* 0.30*  --   --   --   < > = values in this interval not displayed.  Estimated Creatinine Clearance: 13.9 ml/min (by C-G formula based on Cr of 2.93).  Assessment: 80 yo female on heparin for chest pain. Pt has been ruled out for MI but has ST segment changes per cards note. Plan for no further aggressive intervention - conservative medical approach. Pt with continued CP when Nitroglycerin gtt weaned. Heparin level therapeutic (HL= 0.33) on 1150 units/hr. Hgb 8.4 s/p PRBC x 1 10/20 (baseline ~8 over past few mos), plt ok. No bleeding noted.  Goal of Therapy:  Heparin level 0.3-0.7 units/ml Monitor platelets by anticoagulation protocol: Yes   Plan:  - Continue heparin gtt at 1150 units/hr - Daily heparin level and CBC - Will f/u GOC and plan for heparin  Christoper Fabianaron Makynzie Dobesh, PharmD, BCPS Clinical pharmacist, pager [phone]   06/26/2014 12:46 PM

## 2014-06-27 ENCOUNTER — Encounter (HOSPITAL_COMMUNITY): Payer: Self-pay | Admitting: *Deleted

## 2014-06-27 ENCOUNTER — Telehealth: Payer: Self-pay | Admitting: Cardiovascular Disease

## 2014-06-27 DIAGNOSIS — R627 Adult failure to thrive: Secondary | ICD-10-CM

## 2014-06-27 MED ORDER — MORPHINE SULFATE (CONCENTRATE) 10 MG /0.5 ML PO SOLN
10.0000 mg | Freq: Three times a day (TID) | ORAL | Status: DC
  Administered 2014-06-27 – 2014-06-28 (×4): 10 mg via ORAL
  Filled 2014-06-27 (×3): qty 0.5

## 2014-06-27 MED ORDER — MORPHINE SULFATE (CONCENTRATE) 10 MG /0.5 ML PO SOLN
10.0000 mg | ORAL | Status: DC | PRN
  Administered 2014-06-28: 10 mg via ORAL
  Filled 2014-06-27 (×2): qty 0.5

## 2014-06-27 MED ORDER — LORAZEPAM 1 MG PO TABS: 1.0000 mg | ORAL_TABLET | Freq: Four times a day (QID) | ORAL | Status: DC | PRN

## 2014-06-27 MED ORDER — MORPHINE SULFATE (CONCENTRATE) 10 MG /0.5 ML PO SOLN
5.0000 mg | ORAL | Status: DC | PRN
  Administered 2014-06-27: 5 mg via ORAL
  Filled 2014-06-27: qty 0.5

## 2014-06-27 NOTE — Progress Notes (Signed)
Report called to receiving nurse. All questions answered. Past medical history and past medical history reported to receiving nurse [phone]. Daughter, Michela PitcherDonna Kivett made aware of transfer to floor [phone]. All beloingings transferred with patient via bed.

## 2014-06-27 NOTE — Telephone Encounter (Signed)
Notified Megan Mathews  Dr Allyson SabalBerry is not in office at present time Will defer to Dr Wilmon PaliBerry/Kathryn Vogel RN

## 2014-06-27 NOTE — Progress Notes (Addendum)
PROGRESS NOTE  Subjective:   80 yo female with hx of CAD, PVD, stage IV CKD, admitted with progressive unstable angina. Has had pain despite medical therapy.   She has been having lots of angina. Requiring high dose IV NTG. Limited by her low BP. Palliative care has been consulted.   Has been seen by Hospice.  Has been tapered off NTG drip. On Imdur and SL NTG. Also has morphone concentrate syrup to take every hour if needed  She is ready to be discharged home.   Objective:    Vital Signs:   Temp:  [98 F (36.7 C)-99.5 F (37.5 C)] 98.9 F (37.2 C) (10/22 0825) Pulse Rate:  [69-99] 99 (10/22 1100) Resp:  [17-27] 19 (10/22 1100) BP: (79-134)/(30-108) 123/43 mmHg (10/22 0800) SpO2:  [92 %-98 %] 92 % (10/22 1100) FiO2 (%):  [50 %] 50 % (10/21 1600)  Last BM Date: 06/23/14   24-hour weight change: Weight change:   Weight trends: Filed Weights   06/24/14 0300 06/24/14 0557 06/25/14 0509  Weight: 163 lb 8 oz (74.163 kg) 168 lb 10.4 oz (76.5 kg) 166 lb 14.2 oz (75.7 kg)    Intake/Output:  10/21 0701 - 10/22 0700 In: 691.5 [P.O.:480; I.V.:211.5] Out: 675 [Urine:675] Total I/O In: 100 [P.O.:100] Out: 425 [Urine:425]   Physical Exam: BP 123/43  Pulse 99  Temp(Src) 98.9 F (37.2 C) (Oral)  Resp 19  Ht 5' (1.524 m)  Wt 166 lb 14.2 oz (75.7 kg)  BMI 32.59 kg/m2  SpO2 92%  Wt Readings from Last 3 Encounters:  06/25/14 166 lb 14.2 oz (75.7 kg)  06/07/14 163 lb 8 oz (74.163 kg)  04/26/14 157 lb 8 oz (71.442 kg)    General: Vital signs reviewed and noted.  Elderly female,   Head: Normocephalic, atraumatic.  Eyes: conjunctivae/corneas clear.  EOM's intact.   Throat: normal  Neck:  normal   Lungs:   Lungs are clear  Heart:  RR,   Abdomen:  Soft, non-tender, non-distended    Extremities: No edema    Neurologic: A&O X3, CN II - XII are grossly intact.   Psych: Normal     Labs: BMET:  Recent Labs  06/25/14 0234  NA 140  K 4.4  CL 104  CO2 21    GLUCOSE 166*  BUN 56*  CREATININE 2.93*  CALCIUM 9.0    Liver function tests: No results found for this basename: AST, ALT, ALKPHOS, BILITOT, PROT, ALBUMIN,  in the last 72 hours No results found for this basename: LIPASE, AMYLASE,  in the last 72 hours  CBC:  Recent Labs  06/25/14 0234 06/26/14 0220  WBC 16.6* 15.3*  HGB 7.1* 8.4*  HCT 21.9* 25.7*  MCV 90.5 91.1  PLT 280 243    Cardiac Enzymes:  Recent Labs  06/24/14 1145 06/24/14 1650  TROPONINI 0.36* 0.30*    Coagulation Studies: No results found for this basename: LABPROT, INR,  in the last 72 hours  Other: No components found with this basename: POCBNP,  No results found for this basename: DDIMER,  in the last 72 hours No results found for this basename: HGBA1C,  in the last 72 hours No results found for this basename: CHOL, HDL, LDLCALC, TRIG, CHOLHDL,  in the last 72 hours No results found for this basename: TSH, T4TOTAL, FREET3, T3FREE, THYROIDAB,  in the last 72 hours No results found for this basename: VITAMINB12, FOLATE, FERRITIN, TIBC, IRON, RETICCTPCT,  in the last 72  hours   Other results:  EKG:  NSR ,  Mild ST depression   Medications:    Infusions: . nitroGLYCERIN Stopped (06/26/14 1750)    Scheduled Medications: . antiseptic oral rinse  7 mL Mouth Rinse BID  . FLUoxetine  20 mg Oral Daily  . furosemide  40 mg Oral BID  . hydrALAZINE  10 mg Oral Once  . isosorbide mononitrate  60 mg Oral Daily  . pantoprazole  40 mg Oral Daily  . pentoxifylline  400 mg Oral TID WC  . tiotropium  18 mcg Inhalation Daily    Assessment/ Plan:   Principal Problem:   Unstable angina Active Problems:   DM (diabetes mellitus) type II controlled, neurological manifestation   OSA (obstructive sleep apnea)   COPD, moderate   Chronic respiratory failure with hypoxia   Anemia of chronic renal failure   HTN (hypertension)   CKD (chronic kidney disease) stage 4, GFR 15-29 ml/min   DNR (do not  resuscitate) discussion   Palliative care encounter   Chest pain  1. Unstable angina: The patient presents with a weeklong history of unstable angina. We are in a very difficult spot. She has a creatinine of 2.93.   Doing a heart catheterization would be at significant risk for renal failure. She does not want to have dialysis. She also does not have heart surgery.   Palliative care has been consulted.   Family meeting yesterday She is on PO morphine concentrate IMdur Talked with daughter - she may use PRN NTG also.  Would like for hospice to arrange for transport to her house  I spent 20-30 minutes talking to her daughter. She understands that her mother is going home for comfort care. She knows to use the PO morphine, SL NTG as needed. She will call hospice if the family needs assistance in keeping her comfortable.   Talked to hospice nurses.  [facility] We still need for TP to assess her and see what else she needs.   The morphine script will need to be written:  Morphone concentrate 10mg  / 0.5 ml Give 10  mg po Q 1 HR PRN Disp:  30 ml   2. Stage IV CKD:  Following. Has RAS  3. PVD:    4. COPD : on home o2. I have turned her O2 down to 3 lpm and she has maintained an O2 sat of 95%. Acute on chronic respiratory failure.   Disposition:  Length of Stay: 3  Vesta MixerPhilip J. Nahser, Montez HagemanJr., MD, [facility] 06/27/2014, 11:17 AM Office [phone] Pager [phone]

## 2014-06-27 NOTE — Progress Notes (Signed)
Notified of pt/family choice of Hospice and [facility] of [facility]) services after discharge. Pt seen at bedside alert/oriented very HOH; did request writer contact her daughter Megan Mathews whom she lives with- Pt informed she does not currently have any equipment in the home except Oxygen -pt also informed that her daughter was to be attending a funeral today -Clinical research associatewriter attempted to call dtr [phone]) and left voice message await return call. -Note pt is currently on 6 LNC will MD please confirm if this will be the liter flow at d/c (pt was on 3 LNC at home) -Will MD please also comment on activity level - pt may need transport home when ready for d/c -pt has required 8 mg IV Morphine since last evening and new order, today, in place for concentrated liquid Morphine 5 mg q 1 hr prn - if possible, should pt require pain medication, could staff try oral medication first to see if pt obtains relief with this dose  Await return call from daughter to follow and assist with discharge planning as needed.  Megan DavidMargie Jordain Radin, RN, MSN, [facility] Hospital Liaison [phone]

## 2014-06-27 NOTE — Telephone Encounter (Signed)
[facility] and will be discharge either today or tomorrow and Vickie would like to know will Dr. Allyson SabalBerry be her attending physician for Hospice . Their doctors offer symptom management which includes houses calls and scripts if he is not available if he would like . Please Call....   Thanks

## 2014-06-27 NOTE — Progress Notes (Signed)
Progress Note [facility] Pall[facility] Medicine Team [facility]  Subjective:   -patient  is alert and oriented, reports  "a pretty good night", appears tired and weak  -discussed desire to go home with hospice with cardiac team Rosann Auerbach(Trish)  -spoke to daughter Lynnell DikeSusie and family is in support of home with hospice, answered questions and addressed concerns   Objective: Allergies  Allergen Reactions  . Contrast Media [Iodinated Diagnostic Agents] Other (See Comments)    unknown  . Iohexol Rash and Other (See Comments)    Fever, unable to stand also   Scheduled Meds: . antiseptic oral rinse  7 mL Mouth Rinse BID  . FLUoxetine  20 mg Oral Daily  . furosemide  40 mg Oral BID  . hydrALAZINE  10 mg Oral Once  . isosorbide mononitrate  60 mg Oral Daily  . pantoprazole  40 mg Oral Daily  . pentoxifylline  400 mg Oral TID WC  . tiotropium  18 mcg Inhalation Daily   Continuous Infusions: . nitroGLYCERIN Stopped (06/26/14 1750)   PRN Meds:.acetaminophen, albuterol, guaiFENesin, LORazepam, morphine injection, morphine CONCENTRATE, nitroGLYCERIN, ondansetron (ZOFRAN) IV  BP 123/43  Pulse 99  Temp(Src) 98.9 F (37.2 C) (Oral)  Resp 19  Ht 5' (1.524 m)  Wt 75.7 kg (166 lb 14.2 oz)  BMI 32.59 kg/m2  SpO2 92%   PPS:30 % at best  Pain Score: denies presetnly Pain Location unstable angina   Intake/Output Summary (Last 24 hours) at 06/27/14 1146 Last data filed at 06/27/14 1100  Gross per 24 hour  Intake 445.51 ml  Output   1100 ml  Net -654.49 ml       Physical Exam:  General: chronically ill appearing, NAD HEENT:  Dry buccal membranes   Chest: decreased in bases, CTA CVS: RRR Abdomen:soft NT +BS Ext: without edema Neuro:alert and oriented X3, moves all four extremities   Labs: CBC    Component Value Date/Time   WBC 15.3* 06/26/2014 0220   RBC 2.82* 06/26/2014 0220   RBC 2.62* 01/06/2013 0553   HGB 8.4* 06/26/2014 0220   HCT 25.7* 06/26/2014 0220   PLT 243 06/26/2014 0220    MCV 91.1 06/26/2014 0220   MCH 29.8 06/26/2014 0220   MCHC 32.7 06/26/2014 0220   RDW 14.6 06/26/2014 0220   LYMPHSABS 1.3 04/26/2014 1345   MONOABS 0.3 04/26/2014 1345   EOSABS 0.3 04/26/2014 1345   BASOSABS 0.0 04/26/2014 1345    BMET    Component Value Date/Time   NA 140 06/25/2014 0234   K 4.4 06/25/2014 0234   CL 104 06/25/2014 0234   CO2 21 06/25/2014 0234   GLUCOSE 166* 06/25/2014 0234   BUN 56* 06/25/2014 0234   CREATININE 2.93* 06/25/2014 0234   CALCIUM 9.0 06/25/2014 0234   GFRNONAA 14* 06/25/2014 0234   GFRAA 16* 06/25/2014 0234    CMP     Component Value Date/Time   NA 140 06/25/2014 0234   K 4.4 06/25/2014 0234   CL 104 06/25/2014 0234   CO2 21 06/25/2014 0234   GLUCOSE 166* 06/25/2014 0234   BUN 56* 06/25/2014 0234   CREATININE 2.93* 06/25/2014 0234   CALCIUM 9.0 06/25/2014 0234   PROT 6.4 02/26/2014 1053   ALBUMIN 3.3* 02/26/2014 1053   AST 14 02/26/2014 1053   ALT 14 02/26/2014 1053   ALKPHOS 36* 02/26/2014 1053   BILITOT 0.4 02/26/2014 1053   GFRNONAA 14* 06/25/2014 0234   GFRAA 16* 06/25/2014 0234    Assessment and Plan:  1. Code Status: DNR/DNI-comfort is main focus of care 2. Symptom Control:  Chest pain:  -in anticipation for dc home utilize Roxanol 5 mg every 1 hr prn po/sl - Morphine 1 mg every hr prn for chest pain/dyspnea   3. Psycho/Social: Emotional support offered to patient 4. Spiritual:   Strong faith is support for patient and family 5. Disposition: Hopefully home with hospcie services.   Patient Documents Completed or Given: Document Given Completed  Advanced Directives Pkt    MOST X   DNR X   Gone from My Sight    Hard Choices X     Time In Time Out Total Time Spent with Patient Total Overall Time  0830 0905 35 min 35 min    Greater than 50%  of this time was spent counseling and coordinating care related to the above assessment and plan.  Lorinda CreedMary Alakai Macbride NP  Palliative Medicine Team Team Phone # [phone] Pager  [phone]   1

## 2014-06-27 NOTE — Progress Notes (Signed)
1015 patient given prn 5 mg po morphine for chest discomfort. Oxygen saturations 97%. Oxygen decreased to 4L.Bath given, no further c/o discomfort stated. Assisted OOB to chair, patient c/o pain to bil jaws and neck. 2 mg IV morphine administered. [name] and Hospice nurse aware. In speaking with patient and daughter.

## 2014-06-27 NOTE — Progress Notes (Signed)
Follow-up for discharge needs  Patient information reviewed with [name], [facility] Medical Director hospice eligible with dx: CAD with unstable angina. Pt seen in room had gotten up to chair with staff RN Fleet Contrasachel who reported pt experienced neck pain which was relieved with IV Morphine; discussed transition to home with [name] Lyachel RN, [name], [name], daughter Megan JuniperSuzie and dtr Megan LeashDonna (on speaker phone) to initiate education related to hospice services, philosophy and team approach to care family voiced good understanding of information provided. [name] has adjusted liquid Morphine dosing to include scheduled dosing. A request for PT to do bedside eval for mobility was also discussed  Per notes plan is to d/c tomorrow Friday 10/23 by PTAR *Please send completed GOLD DNR Form home with pt *Discharge medication prescription should read Concentrated Liquid Morphine 10mg /0./5 ml give 10 mg TID scheduled and may give 10 mg q 1 hr prn SOB, chest pain  Dispense quantity =30 ml *If staff able to arrange discharge for late morning [facility] admission RN will be able to see pt approximately 3:00 pm  DME has been requested for delivery to the home later today: Complete Pkg D: fully electric hospital bed; AP&P mattress, half rails(upper and lower); overbed table, add 3n1 BSC and light weight wheelchair *Clinical research associatewriter spoke with Loistine Sim[facility]Jewel [facility]  Equipment manager to place [facility] and to contact daughter Megan PitcherDonna Mathews at [phone] to arrange delivery  Patient's PCP Megan Ermalene SearingBedsole; [facility] Initial [facility]erral [facility]  Please notify [facility] when patient is ready to leave unit at d/c call [phone] (or [phone] if after 5 pm);  [facility] information and contact numbers also given to Megan Mathews at bedside  during visit.   Above information shared with [facility] CMRN Please call with any questions or concerns   Valente DavidMargie Betzaira Mentel, RN 06/27/2014, 1:05 PM [facility] RN Liaison [phone]

## 2014-06-27 NOTE — Telephone Encounter (Signed)
Here you go

## 2014-06-28 MED ORDER — HYDRALAZINE HCL 20 MG/ML IJ SOLN: 10.0000 mg | INTRAMUSCULAR | Status: DC | PRN

## 2014-06-28 MED ORDER — GUAIFENESIN ER 600 MG PO TB12: 600.0000 mg | ORAL_TABLET | Freq: Two times a day (BID) | ORAL | Status: AC | PRN

## 2014-06-28 MED ORDER — GLIMEPIRIDE 2 MG PO TABS: 2.0000 mg | ORAL_TABLET | Freq: Every day | ORAL | Status: AC

## 2014-06-28 MED ORDER — MORPHINE SULFATE (CONCENTRATE) 20 MG/ML PO SOLN: 10.0000 mg | ORAL | Status: AC | PRN

## 2014-06-28 NOTE — Telephone Encounter (Signed)
I will not be her attending physician and hospice

## 2014-06-28 NOTE — Progress Notes (Signed)
IVs removed per order. Dressed and up in recliner for 30 min. Discharge instructions and prescriptions given to daughter. Good Teachback. Discharged to EMS for transport home with Hospice. Hospice called. Trina Aoarla Rainer Mounce, RN

## 2014-06-28 NOTE — Progress Notes (Signed)
   Subjective:  No chest pain or SOB; now on 6 N  Objective:   Vital Signs in the last 24 hours: Temp:  [98.1 F (36.7 C)-99.5 F (37.5 C)] 99.2 F (37.3 C) (10/23 0504) Pulse Rate:  [80-99] 87 (10/23 0504) Resp:  [16-26] 16 (10/23 0504) BP: (116-138)/(39-43) 138/40 mmHg (10/23 0504) SpO2:  [92 %-98 %] 98 % (10/23 0806) FiO2 (%):  [3 %] 3 % (10/22 2131)  Intake/Output from previous day: 10/22 0701 - 10/23 0700 In: 320 [P.O.:320] Out: 425 [Urine:425]  Medications: . antiseptic oral rinse  7 mL Mouth Rinse BID  . FLUoxetine  20 mg Oral Daily  . furosemide  40 mg Oral BID  . hydrALAZINE  10 mg Oral Once  . isosorbide mononitrate  60 mg Oral Daily  . morphine CONCENTRATE  10 mg Oral 3 times per day  . pantoprazole  40 mg Oral Daily  . pentoxifylline  400 mg Oral TID WC  . tiotropium  18 mcg Inhalation Daily    . nitroGLYCERIN Stopped (06/26/14 1750)    Physical Exam:   General appearance: alert, cooperative and no distress Neck: no adenopathy, no JVD, supple, symmetrical, trachea midline and thyroid not enlarged, symmetric, no tenderness/mass/nodules Lungs: decreased BS at bases; no wheezing Heart: regular rate and rhythm and 1/6 sem Abdomen: soft, non-tender; bowel sounds normal; no masses,  no organomegaly Extremities: no edema, redness or tenderness in the calves or thighs Neurologic: Grossly normal   Rate: 80  Rhythm: normal sinus rhythm  Lab Results:  No results found for this basename: NA, K, CL, CO2, GLUCOSE, BUN, CREATININE,  in the last 72 hours  No results found for this basename: TROPONINI, CK, MB,  in the last 72 hours  Hepatic Function Panel No results found for this basename: PROT, ALBUMIN, AST, ALT, ALKPHOS, BILITOT, BILIDIR, IBILI,  in the last 72 hours No results found for this basename: INR,  in the last 72 hours BNP (last 3 results) No results found for this basename: PROBNP,  in the last 8760 hours  Lipid Panel     Component Value  Date/Time   CHOL 166 02/22/2014 0641   TRIG 129 02/22/2014 0641   HDL 51 02/22/2014 0641   CHOLHDL 3.3 02/22/2014 0641   VLDL 26 02/22/2014 0641   LDLCALC 89 02/22/2014 0641   LDLDIRECT 81.8 04/16/2013 0902      Imaging:  No results found.    Assessment/Plan:   Principal Problem:   Unstable angina Active Problems:   DM (diabetes mellitus) type II controlled, neurological manifestation   OSA (obstructive sleep apnea)   COPD, moderate   Chronic respiratory failure with hypoxia   Anemia of chronic renal failure   HTN (hypertension)   CKD (chronic kidney disease) stage 4, GFR 15-29 ml/min   DNR (do not resuscitate) discussion   Palliative care encounter   Chest pain  Appreciate palliative care. No recurrent chest pain; now off IV NTG. Will add lopressor 12.5 mg bid for beta blockade. To have home hospice.  Per Dr. Elease HashimotoNahser after talking to hospice nurses, pt will need to be dc'd today and will need morphine prescription for home.    Lennette Biharihomas A. Yadira Hada, MD, [facility] 06/28/2014, 8:44 AM

## 2014-06-28 NOTE — Progress Notes (Signed)
Nutrition Brief Note  Chart reviewed. Pt now transitioning to comfort care. Expected to discharge today with home hospice.   No further nutrition interventions warranted at this time.  Please re-consult as needed.   Joye Wesenberg A. Mayford KnifeWilliams, RD, LDN Pager: [phone] After hours Pager: [phone]

## 2014-06-28 NOTE — Evaluation (Signed)
Physical Therapy Evaluation Patient Details Name: Megan Mathews MRN: [id number] DOB: 05/17/1932 Today's Date: 06/28/2014   History of Present Illness  Megan Mathews is a 80 y.o. female with significant vascular disease both peripheral and coronary s/p multiple PCIs, stage IV CKD, who presents with chest pain.  For the past 36 hours she has had a pressure/burning pain in her chest that radiates to her jaw.  No associated dyspnea, nausea, diaphoresis.  Present at rest and exertion and not relieved by nitroglycerin.  She took aspirin after calling 911 and her pain improved.   Clinical Impression  Pt admitted with above. She required min assist for all functional mobility.  Pt has RW and SPC at home.  Recommend transport w/c and 3n1.  No f//u PT services indicated.  PT signing off.    Follow Up Recommendations No PT follow up    Equipment Recommendations  Wheelchair (measurements PT);3in1 (PT) (transport w/c)    Recommendations for Other Services       Precautions / Restrictions Precautions Precautions: Fall      Mobility  Bed Mobility Overal bed mobility: Needs Assistance Bed Mobility: Supine to Sit     Supine to sit: Min assist        Transfers Overall transfer level: Needs assistance Equipment used: Rolling walker (2 wheeled) Transfers: Sit to/from [employer] CorporationStand;Stand Pivot Transfers Sit to Stand: Min assist Stand pivot transfers: Min assist       General transfer comment: verbal cues for sequencing and safety  Ambulation/Gait                Stairs            Wheelchair Mobility    Modified Rankin (Stroke Patients Only)       Balance                                             Pertinent Vitals/Pain Pain Assessment: No/denies pain    Home Living Family/patient expects to be discharged to:: Private residence Living Arrangements: Children Available Help at Discharge: Family;Available 24 hours/day Type of Home: House Home  Access: Level entry     Home Layout: One level Home Equipment: Walker - 4 wheels;Cane - single point      Prior Function Level of Independence: Independent with assistive device(s)         Comments: pt reports she ambulates with cane or rollator; daughter drives her     Hand Dominance        Extremity/Trunk Assessment   Upper Extremity Assessment: Generalized weakness           Lower Extremity Assessment: Generalized weakness         Communication   Communication: HOH  Cognition Arousal/Alertness: Awake/alert Behavior During Therapy: WFL for tasks assessed/performed Overall Cognitive Status: Within Functional Limits for tasks assessed                      General Comments      Exercises        Assessment/Plan    PT Assessment Patent does not need any further PT services  PT Diagnosis Difficulty walking;Generalized weakness   PT Problem List    PT Treatment Interventions     PT Goals (Current goals can be found in the Care Plan section) Acute Rehab PT Goals PT Goal Formulation: All assessment and  education complete, DC therapy    Frequency     Barriers to discharge        Co-evaluation               End of Session Equipment Utilized During Treatment: Gait belt;Oxygen Activity Tolerance: Patient tolerated treatment well Patient left: in chair;with call bell/phone within reach Nurse Communication: Mobility status         Time: [id number] PT Time Calculation (min): 23 min   Charges:   PT Evaluation $Initial PT Evaluation Tier I: 1 Procedure PT Treatments $Therapeutic Activity: 8-22 mins   PT G Codes:          Ilda FoilGarrow, Ardis Fullwood Rene 06/28/2014, 12:14 PM

## 2014-06-28 NOTE — Discharge Summary (Signed)
CARDIOLOGY DISCHARGE SUMMARY   Patient ID: Megan Mathews MRN: [id number] DOB/AGE: 78/02/1932 78 y.o.  Admit date: 06/24/2014 Discharge date: 06/28/2014  PCP: Kerby NoraAmy Bedsole, MD Primary Cardiologist: Dr. Allyson SabalBerry  Primary Discharge Diagnosis:    Unstable angina - medical therapy recommended Secondary Discharge Diagnosis:    DM (diabetes mellitus) type II controlled, neurological manifestation   OSA (obstructive sleep apnea)   COPD, moderate   Chronic respiratory failure with hypoxia   Anemia of chronic renal failure   HTN (hypertension)   CKD (chronic kidney disease) stage 4, GFR 15-29 ml/min   DNR (do not resuscitate) discussion   Palliative care encounter   Chest pain  Consults: Palliative Care   Procedures: 2 View CXR  Hospital Course: Megan Mathews is a 80 y.o. female with a history of CAD. She had substernal pressure and a burning pain in her chest that radiated to her jaw. He was not initially relieved by nitroglycerin and aspirin at home. She came to the emergency room and was started on a heparin drip. She was admitted for further evaluation and treatment.  She had recurrent episodes of chest pain that were treated with sublingual nitroglycerin and IV nitroglycerin. These episodes were associated with diffuse ST depression. She was seen by Dr. Allyson SabalBerry initially who knows her well. He discussed the situation with her and after long discussion, she decided not to pursue aggressive/interventional approach. Because of her poor renal function and other comorbidities, this is appropriate.  She had recurrent episodes of chest pain that were treated with IV nitroglycerin and morphine. These were associated with ECG changes, and her enzymes were mildly elevated. However, after discussion with the patient and her family, medical therapy was still felt to be the best option. A palliative care consult was called.  She was seen by palliative care and referred to hospice. She was  started on oral morphine and given Imdur and a beta blocker, as her blood pressure would allow. She was noted to be anemic which is chronic. On admission, her hemoglobin and hematocrit were low and she was transfused one unit. However, after the decision was made for comfort care, no further transfusions are indicated.  By 06/28/2014, her symptoms had improved on current medical therapy. She was requiring very little morphine. Her respiratory status was stable on oral Lasix. Arrangements had been made with the family for 24 7 care in the hospital bed had been installed. No further inpatient care was needed and she was considered stable for discharge, to followup with Hospice and with cardiology when necessary.  Labs:   Lab Results  Component Value Date   WBC 15.3* 06/26/2014   HGB 8.4* 06/26/2014   HCT 25.7* 06/26/2014   MCV 91.1 06/26/2014   PLT 243 06/26/2014     Recent Labs Lab 06/25/14 0234  NA 140  K 4.4  CL 104  CO2 21  BUN 56*  CREATININE 2.93*  CALCIUM 9.0  GLUCOSE 166*   Troponin I  Date Value Ref Range Status  06/24/2014 0.30* <0.30 ng/mL Final  06/24/2014 0.36* <0.30 ng/mL Final   Radiology: Dg Chest 2 View 06/24/2014   CLINICAL DATA:  Central chest pain for a few days radiating to the jaw.  EXAM: CHEST  2 VIEW  COMPARISON:  01/16/2013  FINDINGS: Borderline heart size with mild pulmonary vascular congestion. Nodule or focal infiltration suggested in the left superior hilar region. Consider chest CT for further evaluation of this finding. This measures about 2.6  cm in diameter and probably localizes posteriorly. Mild emphysematous changes in the lungs. Fluid or thickened pleura in the left costophrenic angle. No pneumothorax. Calcification of the aorta. Surgical clips in the base of the neck.  IMPRESSION: Borderline heart size with mild pulmonary vascular congestion. Developing nodule versus focal infiltration in the left suprahilar region. Consider CT for further  evaluation.   Electronically Signed   By: Burman Nieves M.D.   On: 06/24/2014 03:31   EKG: 06/25/2014 Sinus tachycardia, inferolateral ST changes consistent with ischemia Vent. rate 101 BPM PR interval 216 ms QRS duration 86 ms QT/QTc 334/433 ms P-R-T axes * 23 201  FOLLOW UP PLANS AND APPOINTMENTS Allergies  Allergen Reactions  . Contrast Media [Iodinated Diagnostic Agents] Other (See Comments)    unknown  . Iohexol Rash and Other (See Comments)    Fever, unable to stand also     Medication List    STOP taking these medications       CARTIA XT 180 MG 24 hr capsule  Generic drug:  diltiazem     clopidogrel 75 MG tablet  Commonly known as:  PLAVIX     glucose blood test strip     MUCUS RELIEF 400 MG Tabs tablet  Generic drug:  guaifenesin  Replaced by:  guaiFENesin 600 MG 12 hr tablet     oxybutynin 10 MG 24 hr tablet  Commonly known as:  DITROPAN-XL     potassium chloride 10 MEQ CR capsule  Commonly known as:  MICRO-K     simvastatin 80 MG tablet  Commonly known as:  ZOCOR      TAKE these medications       acetaminophen 500 MG tablet  Commonly known as:  TYLENOL  Take 1,000 mg by mouth every 6 (six) hours as needed for mild pain, fever or headache.     albuterol 108 (90 BASE) MCG/ACT inhaler  Commonly known as:  PROAIR HFA  Inhale 2 puffs into the lungs every 4 (four) hours as needed (Cough, wheeze, or chest congestion). 2 puffs up to 4 times a day as needed     albuterol (2.5 MG/3ML) 0.083% nebulizer solution  Commonly known as:  PROVENTIL  Take 2.5 mg by nebulization every 6 (six) hours as needed. For shortness of breath     aspirin 81 MG EC tablet  Take 1 tablet (81 mg total) by mouth daily.     ferrous sulfate 325 (65 FE) MG tablet  Take 2 tablets (650 mg total) by mouth daily.     FLUoxetine 20 MG capsule  Commonly known as:  PROZAC  Take 20 mg by mouth daily.     furosemide 40 MG tablet  Commonly known as:  LASIX  Take 40 mg by mouth 2  (two) times daily.     glimepiride 2 MG tablet  Commonly known as:  AMARYL  Take 1 tablet (2 mg total) by mouth daily with breakfast. Give only if blood sugar over 200.     guaiFENesin 600 MG 12 hr tablet  Commonly known as:  MUCINEX  Take 1 tablet (600 mg total) by mouth 2 (two) times daily as needed.     isosorbide mononitrate 60 MG 24 hr tablet  Commonly known as:  IMDUR  Take 60 mg by mouth daily. Takes along with the 30mg  tablet to equal 90mg      metoprolol succinate 25 MG 24 hr tablet  Commonly known as:  TOPROL-XL  Take 25 mg by mouth daily.  morphine 20 MG/ML concentrated solution  Commonly known as:  ROXANOL  Take 0.5 mLs (10 mg total) by mouth every hour as needed for moderate pain or shortness of breath.     MULTIVITAL PO  Take 1 tablet by mouth daily.     nitroGLYCERIN 0.4 MG SL tablet  Commonly known as:  NITROSTAT  Place 1 tablet (0.4 mg total) under the tongue every 5 (five) minutes as needed for chest pain.     oxymetazoline 0.05 % nasal spray  Commonly known as:  AFRIN  Place 1 spray into both nostrils 2 (two) times daily as needed (for nose bleeds).     pantoprazole 40 MG tablet  Commonly known as:  PROTONIX  Take 40 mg by mouth daily.     pentoxifylline 400 MG CR tablet  Commonly known as:  TRENTAL  Take 400 mg by mouth 3 (three) times daily with meals.     tiotropium 18 MCG inhalation capsule  Commonly known as:  SPIRIVA  Place 18 mcg into inhaler and inhale daily.        Discharge Instructions   Diet - low sodium heart healthy    Complete by:  As directed      Increase activity slowly    Complete by:  As directed           Follow-up Information   Follow up with Runell GessBERRY,JONATHAN J, MD. (As needed.)    Specialty:  Cardiology   Contact information:   [address] [place] [id number] [phone]       BRING ALL MEDICATIONS WITH YOU TO FOLLOW UP APPOINTMENTS  Time spent with patient to include physician time: 47  min Signed: Theodore Demarkhonda Barrett, PA-C 06/28/2014, 1:30 PM Co-Sign MD

## 2014-07-01 ENCOUNTER — Telehealth: Payer: Self-pay

## 2014-07-01 NOTE — Telephone Encounter (Signed)
Carol with [facility] left v/m requesting order for Senna S for bowel regimen.Carol request cb.

## 2014-07-01 NOTE — Telephone Encounter (Signed)
Okay to given verbal order as requested for senna S

## 2014-07-01 NOTE — Telephone Encounter (Signed)
Hospice is aware that Dr Allyson SabalBerry does not want to be the attending.

## 2014-07-02 NOTE — Telephone Encounter (Signed)
Okey Regalarol given verbal order to Senna S as instructed by Dr. Ermalene SearingBedsole.

## 2014-07-02 NOTE — Telephone Encounter (Signed)
Left message for Megan Mathews with Hospice to return my call.

## 2014-07-07 NOTE — Consult Note (Signed)
I have reviewed and discussed case with Nurse Practitioner And agree with documentation and plan as noted above   Chloe Bluett J. Jameon Deller D.O.  Palliative Medicine Team at [place]  Team Phone: [phone]    

## 2014-07-07 DEATH — deceased

## 2014-07-11 ENCOUNTER — Telehealth: Payer: Self-pay | Admitting: Cardiovascular Disease

## 2014-07-11 NOTE — Telephone Encounter (Signed)
Dr Allyson SabalBerry, Lorain ChildesFYI

## 2014-07-11 NOTE — Telephone Encounter (Signed)
Susie called in wanting to inform Dr. Allyson SabalBerry of Ms. Zamudio's passing on 11/3.

## 2014-07-14 NOTE — Telephone Encounter (Signed)
Samara DeistKathryn, let's send a condolence note to Chance's family

## 2014-07-25 NOTE — Telephone Encounter (Signed)
Logged patient in epic and allscript as being deceased on 07/09/2014.  cbr
